# Patient Record
Sex: Male | Born: 1950
Health system: Southern US, Community
[De-identification: ages and names within clinical notes are randomized; demographics above are authoritative.]

## PROBLEM LIST (undated history)

## (undated) DIAGNOSIS — I2510 Atherosclerotic heart disease of native coronary artery without angina pectoris: Secondary | ICD-10-CM

## (undated) DIAGNOSIS — I509 Heart failure, unspecified: Secondary | ICD-10-CM

## (undated) DIAGNOSIS — I10 Essential (primary) hypertension: Secondary | ICD-10-CM

## (undated) DIAGNOSIS — E785 Hyperlipidemia, unspecified: Secondary | ICD-10-CM

## (undated) DIAGNOSIS — G4733 Obstructive sleep apnea (adult) (pediatric): Secondary | ICD-10-CM

## (undated) HISTORY — DX: Hyperlipidemia, unspecified: E78.5

## (undated) HISTORY — DX: Obstructive sleep apnea (adult) (pediatric): G47.33

## (undated) HISTORY — PX: CARDIAC CATHETERIZATION: SHX172

## (undated) HISTORY — DX: Essential (primary) hypertension: I10

## (undated) HISTORY — DX: Atherosclerotic heart disease of native coronary artery without angina pectoris: I25.10

---

## 1997-10-03 ENCOUNTER — Ambulatory Visit (HOSPITAL_COMMUNITY): Admission: RE | Admit: 1997-10-03 | Discharge: 1997-10-03 | Payer: Self-pay | Admitting: Cardiology

## 1997-10-04 ENCOUNTER — Ambulatory Visit (HOSPITAL_COMMUNITY): Admission: RE | Admit: 1997-10-04 | Discharge: 1997-10-04 | Payer: Self-pay | Admitting: Cardiology

## 2000-06-04 ENCOUNTER — Encounter: Admission: RE | Admit: 2000-06-04 | Discharge: 2000-06-04 | Payer: Self-pay | Admitting: Internal Medicine

## 2002-05-08 ENCOUNTER — Encounter: Payer: Self-pay | Admitting: General Surgery

## 2002-05-10 ENCOUNTER — Encounter (INDEPENDENT_AMBULATORY_CARE_PROVIDER_SITE_OTHER): Payer: Self-pay | Admitting: Specialist

## 2002-05-10 ENCOUNTER — Encounter: Payer: Self-pay | Admitting: General Surgery

## 2002-05-10 ENCOUNTER — Ambulatory Visit (HOSPITAL_COMMUNITY): Admission: RE | Admit: 2002-05-10 | Discharge: 2002-05-10 | Payer: Self-pay | Admitting: General Surgery

## 2002-07-02 ENCOUNTER — Encounter: Payer: Self-pay | Admitting: General Surgery

## 2002-07-02 ENCOUNTER — Ambulatory Visit (HOSPITAL_COMMUNITY): Admission: RE | Admit: 2002-07-02 | Discharge: 2002-07-02 | Payer: Self-pay | Admitting: General Surgery

## 2004-10-06 ENCOUNTER — Ambulatory Visit: Payer: Self-pay | Admitting: Family Medicine

## 2004-10-16 ENCOUNTER — Ambulatory Visit (HOSPITAL_COMMUNITY): Admission: RE | Admit: 2004-10-16 | Discharge: 2004-10-16 | Payer: Self-pay | Admitting: Family Medicine

## 2004-10-20 ENCOUNTER — Ambulatory Visit: Payer: Self-pay | Admitting: Gastroenterology

## 2004-11-06 ENCOUNTER — Ambulatory Visit: Payer: Self-pay | Admitting: Family Medicine

## 2004-11-27 ENCOUNTER — Ambulatory Visit: Payer: Self-pay | Admitting: Gastroenterology

## 2005-05-05 ENCOUNTER — Ambulatory Visit: Payer: Self-pay | Admitting: Family Medicine

## 2005-05-10 ENCOUNTER — Ambulatory Visit (HOSPITAL_COMMUNITY): Admission: RE | Admit: 2005-05-10 | Discharge: 2005-05-10 | Payer: Self-pay | Admitting: Family Medicine

## 2005-06-02 ENCOUNTER — Ambulatory Visit: Payer: Self-pay | Admitting: Family Medicine

## 2005-06-17 ENCOUNTER — Ambulatory Visit: Payer: Self-pay | Admitting: Family Medicine

## 2005-07-16 ENCOUNTER — Ambulatory Visit: Payer: Self-pay | Admitting: Family Medicine

## 2005-07-27 ENCOUNTER — Ambulatory Visit: Payer: Self-pay | Admitting: Family Medicine

## 2005-08-13 ENCOUNTER — Ambulatory Visit: Payer: Self-pay | Admitting: Family Medicine

## 2005-09-06 ENCOUNTER — Ambulatory Visit: Payer: Self-pay | Admitting: Family Medicine

## 2005-09-27 ENCOUNTER — Ambulatory Visit: Payer: Self-pay | Admitting: Family Medicine

## 2005-09-29 ENCOUNTER — Encounter (INDEPENDENT_AMBULATORY_CARE_PROVIDER_SITE_OTHER): Payer: Self-pay | Admitting: Family Medicine

## 2005-09-29 ENCOUNTER — Ambulatory Visit (HOSPITAL_COMMUNITY): Admission: RE | Admit: 2005-09-29 | Discharge: 2005-09-29 | Payer: Self-pay | Admitting: Family Medicine

## 2005-09-29 LAB — CONVERTED CEMR LAB
RBC count: 4.85 10*6/uL
WBC, blood: 7.2 10*3/uL

## 2005-10-25 ENCOUNTER — Ambulatory Visit: Payer: Self-pay | Admitting: Family Medicine

## 2005-12-27 ENCOUNTER — Ambulatory Visit: Payer: Self-pay | Admitting: Family Medicine

## 2006-01-21 ENCOUNTER — Ambulatory Visit (HOSPITAL_COMMUNITY): Admission: RE | Admit: 2006-01-21 | Discharge: 2006-01-21 | Payer: Self-pay | Admitting: Family Medicine

## 2006-01-21 ENCOUNTER — Ambulatory Visit: Payer: Self-pay | Admitting: Family Medicine

## 2006-02-04 ENCOUNTER — Ambulatory Visit: Payer: Self-pay | Admitting: Family Medicine

## 2006-02-23 ENCOUNTER — Ambulatory Visit: Payer: Self-pay | Admitting: Family Medicine

## 2006-07-26 ENCOUNTER — Telehealth (INDEPENDENT_AMBULATORY_CARE_PROVIDER_SITE_OTHER): Payer: Self-pay | Admitting: Family Medicine

## 2006-07-26 DIAGNOSIS — E785 Hyperlipidemia, unspecified: Secondary | ICD-10-CM

## 2006-07-26 DIAGNOSIS — I10 Essential (primary) hypertension: Secondary | ICD-10-CM | POA: Insufficient documentation

## 2006-08-29 ENCOUNTER — Encounter: Payer: Self-pay | Admitting: Family Medicine

## 2006-08-29 DIAGNOSIS — N200 Calculus of kidney: Secondary | ICD-10-CM | POA: Insufficient documentation

## 2006-08-29 DIAGNOSIS — D126 Benign neoplasm of colon, unspecified: Secondary | ICD-10-CM | POA: Insufficient documentation

## 2006-08-30 ENCOUNTER — Ambulatory Visit: Payer: Self-pay | Admitting: Family Medicine

## 2006-08-30 DIAGNOSIS — M722 Plantar fascial fibromatosis: Secondary | ICD-10-CM | POA: Insufficient documentation

## 2006-08-30 LAB — CONVERTED CEMR LAB
Cholesterol, target level: 200 mg/dL
LDL Goal: 130 mg/dL

## 2006-10-07 ENCOUNTER — Encounter (INDEPENDENT_AMBULATORY_CARE_PROVIDER_SITE_OTHER): Payer: Self-pay | Admitting: Family Medicine

## 2006-10-13 LAB — CONVERTED CEMR LAB
AST: 25 units/L (ref 0–37)
Albumin: 4.5 g/dL (ref 3.5–5.2)
Calcium: 9.6 mg/dL (ref 8.4–10.5)
Glucose, Bld: 93 mg/dL (ref 70–99)
Hemoglobin: 15.6 g/dL (ref 13.0–17.0)
Lymphocytes Relative: 31 % (ref 12–46)
Neutro Abs: 3 10*3/uL (ref 1.7–7.7)
Neutrophils Relative %: 54 % (ref 43–77)
PSA: 0.39 ng/mL (ref 0.10–4.00)
Platelets: 241 10*3/uL (ref 150–400)
RBC: 4.92 M/uL (ref 4.22–5.81)
RDW: 13.1 % (ref 11.5–14.0)
Sodium: 140 meq/L (ref 135–145)
Total Bilirubin: 0.7 mg/dL (ref 0.3–1.2)
Total Protein: 7.4 g/dL (ref 6.0–8.3)
VLDL: 23 mg/dL (ref 0–40)

## 2006-11-11 ENCOUNTER — Ambulatory Visit: Payer: Self-pay | Admitting: Family Medicine

## 2006-11-11 DIAGNOSIS — L219 Seborrheic dermatitis, unspecified: Secondary | ICD-10-CM | POA: Insufficient documentation

## 2007-02-20 ENCOUNTER — Telehealth (INDEPENDENT_AMBULATORY_CARE_PROVIDER_SITE_OTHER): Payer: Self-pay | Admitting: Family Medicine

## 2007-02-20 ENCOUNTER — Encounter (INDEPENDENT_AMBULATORY_CARE_PROVIDER_SITE_OTHER): Payer: Self-pay | Admitting: Family Medicine

## 2007-04-25 ENCOUNTER — Telehealth (INDEPENDENT_AMBULATORY_CARE_PROVIDER_SITE_OTHER): Payer: Self-pay | Admitting: *Deleted

## 2007-05-08 ENCOUNTER — Ambulatory Visit: Payer: Self-pay | Admitting: Family Medicine

## 2007-05-08 DIAGNOSIS — F438 Other reactions to severe stress: Secondary | ICD-10-CM | POA: Insufficient documentation

## 2007-05-09 ENCOUNTER — Telehealth (INDEPENDENT_AMBULATORY_CARE_PROVIDER_SITE_OTHER): Payer: Self-pay | Admitting: *Deleted

## 2007-05-09 LAB — CONVERTED CEMR LAB
ALT: 43 units/L (ref 0–53)
AST: 30 units/L (ref 0–37)
Chloride: 106 meq/L (ref 96–112)
Potassium: 3.9 meq/L (ref 3.5–5.3)
Sodium: 139 meq/L (ref 135–145)

## 2007-05-10 ENCOUNTER — Encounter (INDEPENDENT_AMBULATORY_CARE_PROVIDER_SITE_OTHER): Payer: Self-pay | Admitting: Family Medicine

## 2007-05-26 ENCOUNTER — Telehealth (INDEPENDENT_AMBULATORY_CARE_PROVIDER_SITE_OTHER): Payer: Self-pay | Admitting: Family Medicine

## 2007-05-26 ENCOUNTER — Ambulatory Visit: Payer: Self-pay | Admitting: Family Medicine

## 2007-06-09 ENCOUNTER — Ambulatory Visit: Payer: Self-pay | Admitting: Family Medicine

## 2007-07-07 ENCOUNTER — Ambulatory Visit: Payer: Self-pay | Admitting: Family Medicine

## 2007-07-13 ENCOUNTER — Encounter (INDEPENDENT_AMBULATORY_CARE_PROVIDER_SITE_OTHER): Payer: Self-pay | Admitting: Family Medicine

## 2007-07-15 ENCOUNTER — Encounter (INDEPENDENT_AMBULATORY_CARE_PROVIDER_SITE_OTHER): Payer: Self-pay | Admitting: Family Medicine

## 2007-08-17 ENCOUNTER — Telehealth (INDEPENDENT_AMBULATORY_CARE_PROVIDER_SITE_OTHER): Payer: Self-pay | Admitting: Family Medicine

## 2007-10-05 ENCOUNTER — Ambulatory Visit: Payer: Self-pay | Admitting: Family Medicine

## 2007-10-05 DIAGNOSIS — E119 Type 2 diabetes mellitus without complications: Secondary | ICD-10-CM

## 2007-10-05 LAB — CONVERTED CEMR LAB
Bilirubin Urine: NEGATIVE
Glucose, Bld: 114 mg/dL
Ketones, urine, test strip: NEGATIVE
Specific Gravity, Urine: 1.02
WBC Urine, dipstick: NEGATIVE

## 2007-10-07 ENCOUNTER — Encounter (INDEPENDENT_AMBULATORY_CARE_PROVIDER_SITE_OTHER): Payer: Self-pay | Admitting: Family Medicine

## 2007-10-09 ENCOUNTER — Telehealth (INDEPENDENT_AMBULATORY_CARE_PROVIDER_SITE_OTHER): Payer: Self-pay | Admitting: *Deleted

## 2007-10-10 LAB — CONVERTED CEMR LAB
ALT: 52 units/L (ref 0–53)
Alkaline Phosphatase: 46 units/L (ref 39–117)
BUN: 14 mg/dL (ref 6–23)
Basophils Absolute: 0 10*3/uL (ref 0.0–0.1)
Calcium: 9.5 mg/dL (ref 8.4–10.5)
Creatinine, Urine: 176.5 mg/dL
HCT: 44 % (ref 39.0–52.0)
HDL: 45 mg/dL (ref >39)
Lymphocytes Relative: 35 % (ref 12–46)
Lymphs Abs: 2.1 10*3/uL (ref 0.7–4.0)
MCHC: 32.7 g/dL (ref 30.0–36.0)
MCV: 95.7 fL (ref 78.0–100.0)
Microalb, Ur: 0.6 mg/dL (ref 0.00–1.89)
Neutro Abs: 3.2 10*3/uL (ref 1.7–7.7)
Neutrophils Relative %: 53 % (ref 43–77)
RDW: 13.3 % (ref 11.5–15.5)
Sodium: 141 meq/L (ref 135–145)
Total Bilirubin: 0.5 mg/dL (ref 0.3–1.2)
Total Protein: 7.1 g/dL (ref 6.0–8.3)
VLDL: 27 mg/dL (ref 0–40)
WBC: 6 10*3/uL (ref 4.0–10.5)

## 2007-10-18 ENCOUNTER — Encounter (INDEPENDENT_AMBULATORY_CARE_PROVIDER_SITE_OTHER): Payer: Self-pay | Admitting: Internal Medicine

## 2007-10-18 ENCOUNTER — Ambulatory Visit: Payer: Self-pay | Admitting: Family Medicine

## 2007-10-20 ENCOUNTER — Encounter (INDEPENDENT_AMBULATORY_CARE_PROVIDER_SITE_OTHER): Payer: Self-pay | Admitting: Family Medicine

## 2007-12-28 ENCOUNTER — Ambulatory Visit: Payer: Self-pay | Admitting: Family Medicine

## 2007-12-28 LAB — CONVERTED CEMR LAB: Glucose, Bld: 83 mg/dL

## 2008-03-29 ENCOUNTER — Ambulatory Visit: Payer: Self-pay | Admitting: Family Medicine

## 2008-04-09 ENCOUNTER — Telehealth (INDEPENDENT_AMBULATORY_CARE_PROVIDER_SITE_OTHER): Payer: Self-pay | Admitting: Family Medicine

## 2008-04-10 ENCOUNTER — Ambulatory Visit: Payer: Self-pay | Admitting: Family Medicine

## 2008-07-03 ENCOUNTER — Encounter (INDEPENDENT_AMBULATORY_CARE_PROVIDER_SITE_OTHER): Payer: Self-pay | Admitting: Family Medicine

## 2008-08-16 ENCOUNTER — Ambulatory Visit: Payer: Self-pay | Admitting: Family Medicine

## 2008-08-16 DIAGNOSIS — M109 Gout, unspecified: Secondary | ICD-10-CM

## 2008-08-17 ENCOUNTER — Encounter (INDEPENDENT_AMBULATORY_CARE_PROVIDER_SITE_OTHER): Payer: Self-pay | Admitting: Family Medicine

## 2008-08-19 LAB — CONVERTED CEMR LAB
ALT: 28 units/L (ref 0–53)
AST: 22 units/L (ref 0–37)
Albumin: 4.8 g/dL (ref 3.5–5.2)
BUN: 19 mg/dL (ref 6–23)
CO2: 24 meq/L (ref 19–32)
Glucose, Bld: 113 mg/dL — ABNORMAL HIGH (ref 70–99)
Potassium: 4.4 meq/L (ref 3.5–5.3)
Uric Acid, Serum: 10.2 mg/dL — ABNORMAL HIGH (ref 4.0–7.8)

## 2008-09-27 ENCOUNTER — Encounter (INDEPENDENT_AMBULATORY_CARE_PROVIDER_SITE_OTHER): Payer: Self-pay | Admitting: Family Medicine

## 2008-10-01 ENCOUNTER — Encounter (INDEPENDENT_AMBULATORY_CARE_PROVIDER_SITE_OTHER): Payer: Self-pay | Admitting: *Deleted

## 2008-10-01 LAB — CONVERTED CEMR LAB: Uric Acid, Serum: 5.8 mg/dL (ref 4.0–7.8)

## 2008-11-08 ENCOUNTER — Telehealth (INDEPENDENT_AMBULATORY_CARE_PROVIDER_SITE_OTHER): Payer: Self-pay | Admitting: *Deleted

## 2008-11-15 ENCOUNTER — Ambulatory Visit: Payer: Self-pay | Admitting: Family Medicine

## 2008-11-15 LAB — CONVERTED CEMR LAB: Blood Glucose, Fasting: 113 mg/dL

## 2008-11-16 ENCOUNTER — Encounter (INDEPENDENT_AMBULATORY_CARE_PROVIDER_SITE_OTHER): Payer: Self-pay | Admitting: Family Medicine

## 2008-11-18 LAB — CONVERTED CEMR LAB
BUN: 19 mg/dL (ref 6–23)
Basophils Relative: 0 % (ref 0–1)
CO2: 23 meq/L (ref 19–32)
Calcium: 9.6 mg/dL (ref 8.4–10.5)
Creatinine, Urine: 112.2 mg/dL
Eosinophils Absolute: 0.1 10*3/uL (ref 0.0–0.7)
HCT: 42.2 % (ref 39.0–52.0)
HDL: 41 mg/dL (ref >39)
Lymphocytes Relative: 25 % (ref 12–46)
Lymphs Abs: 1.7 10*3/uL (ref 0.7–4.0)
MCV: 92.7 fL (ref 78.0–100.0)
Microalb Creat Ratio: 4.5 mg/g (ref 0.0–30.0)
Microalb, Ur: 0.5 mg/dL (ref 0.00–1.89)
Monocytes Absolute: 0.6 10*3/uL (ref 0.1–1.0)
Neutrophils Relative %: 65 % (ref 43–77)
Platelets: 206 10*3/uL (ref 150–400)
Potassium: 4 meq/L (ref 3.5–5.3)
Sodium: 139 meq/L (ref 135–145)
TSH: 1.424 microintl units/mL (ref 0.350–4.500)
Triglycerides: 120 mg/dL (ref <150)
WBC: 6.8 10*3/uL (ref 4.0–10.5)

## 2008-12-19 ENCOUNTER — Encounter (INDEPENDENT_AMBULATORY_CARE_PROVIDER_SITE_OTHER): Payer: Self-pay | Admitting: Family Medicine

## 2009-12-23 ENCOUNTER — Encounter (INDEPENDENT_AMBULATORY_CARE_PROVIDER_SITE_OTHER): Payer: Self-pay | Admitting: *Deleted

## 2009-12-25 ENCOUNTER — Ambulatory Visit: Payer: Self-pay | Admitting: Gastroenterology

## 2010-01-08 ENCOUNTER — Ambulatory Visit: Payer: Self-pay | Admitting: Gastroenterology

## 2010-01-09 ENCOUNTER — Encounter: Payer: Self-pay | Admitting: Gastroenterology

## 2010-06-23 NOTE — Procedures (Signed)
Summary: Colonoscopy  Patient: Warren [name] Note: All result statuses are Final unless otherwise noted.  Tests: (1) Colonoscopy (COL)   COL Colonoscopy           DONE     [facility]     [address]     [place], [place]  [address]           COLONOSCOPY PROCEDURE REPORT           PATIENT:  Warren, [name]  MR#:  [id number]     BIRTHDATE:  1950/06/21, 59 yrs. old  GENDER:  male           ENDOSCOPIST:  Warren Hair. Arlyce Dice, MD     Referred by:           PROCEDURE DATE:  01/08/2010     PROCEDURE:  Colonoscopy with snare polypectomy     ASA CLASS:  Class I     INDICATIONS:  1) screening  2) history of pre-cancerous     (adenomatous) colon polyps Last colo 2006           MEDICATIONS:   Fentanyl 75 mcg IV, Versed 7 mg IV           DESCRIPTION OF PROCEDURE:   After the risks benefits and     alternatives of the procedure were thoroughly explained, informed     consent was obtained.  Digital rectal exam was performed and     revealed no abnormalities.   The [id number] [id number] endoscope was     introduced through the anus and advanced to the cecum, which was     identified by both the appendix and ileocecal valve, without     limitations.  The quality of the prep was excellent, using     MoviPrep.  The instrument was then slowly withdrawn as the colon     was fully examined.     <<PROCEDUREIMAGES>>           FINDINGS:  A sessile polyp was found at the hepatic flexure. It     was 4 mm in size. Polyp was snared without cautery. Retrieval was     successful (see image1 and image2). snare polyp  This was     otherwise a normal examination of the colon (see image3, image4,     image5, image6, image8, image10, image11, and image12).     Retroflexed views in the rectum revealed no abnormalities.    The     time to cecum =  3.50  minutes. The scope was then withdrawn (time     =  6.0  min) from the patient and the procedure completed.           COMPLICATIONS:  None           ENDOSCOPIC  IMPRESSION:     1) 4 mm sessile polyp at the hepatic flexure     2) Otherwise normal examination     RECOMMENDATIONS:     1) If the polyp(s) removed today are proven to be adenomatous     (pre-cancerous) polyps, you will need a repeat colonoscopy in 5     years. Otherwise you should continue to follow colorectal cancer     screening guidelines for "routine risk" patients with colonoscopy     in 10 years.           REPEAT EXAM:   You will receive a letter from Dr. Arlyce Dice in 1-2     weeks,  after reviewing the final pathology, with followup     recommendations.           ______________________________     Warren Hair Arlyce Dice, MD           CC: Dwana Melena MD           n.     Warren DoctorBarbette Hair. Aidynn [name] at 01/08/2010 09:56 AM           Warren [name], [id number]  Note: An exclamation mark (!) indicates a result that was not dispersed into the flowsheet. Document Creation Date: 01/08/2010 9:56 AM _______________________________________________________________________  (1) Order result status: Final Collection or observation date-time: 01/08/2010 09:52 Requested date-time:  Receipt date-time:  Reported date-time:  Referring Physician:   Ordering Physician: Melvia Heaps [phone]) Specimen Source:  Source: Launa Grill Order Number: [id number] Lab site:   Appended Document: Colonoscopy     Procedures Next Due Date:    Colonoscopy: 12/2014

## 2010-06-23 NOTE — Miscellaneous (Signed)
Summary: [facility] Previsit/prep  Clinical Lists Changes  Medications: Added new medication of MOVIPREP 100 GM  SOLR (PEG-KCL-NACL-NASULF-NA ASC-C) As per prep instructions. - Signed Rx of MOVIPREP 100 GM  SOLR (PEG-KCL-NACL-NASULF-NA ASC-C) As per prep instructions.;  #1 x 0;  Signed;  Entered by: Wyona Almas RN;  Authorized by: Louis Meckel MD;  Method used: Electronically to [facility] [address] [place], [place], [place]  [address], Ph: [phone], Fax: [phone] Observations: Added new observation of ALLERGY REV: Done (12/25/2009 7:38)    Prescriptions: MOVIPREP 100 GM  SOLR (PEG-KCL-NACL-NASULF-NA ASC-C) As per prep instructions.  #1 x 0   Entered by:   Wyona Almas RN   Authorized by:   Louis Meckel MD   Signed by:   Wyona Almas RN on 12/25/2009   Method used:   Electronically to        [facility]  [address] (retail)       [address]       [place], [place]  [address]       Ph: [phone]       Fax: [phone]   RxID:   [id number]

## 2010-06-23 NOTE — Letter (Signed)
Summary: Moviprep Instructions  [facility]  [address]   [place], [place] [address]   Phone: [phone]  Fax: [phone]       REECE FEHNEL    1951/04/18    MRN: [id number]        Procedure Day Dorna Bloom: Thursday, 01-08-10     Arrival Time: 8:30 a.m.     Procedure Time: 9:30 a.m.     Location of Procedure:                    x   [facility] (4th Floor)                        PREPARATION FOR COLONOSCOPY WITH MOVIPREP   Starting 5 days prior to your procedure 01-03-10 do not eat nuts, seeds, popcorn, corn, beans, peas,  salads, or any raw vegetables.  Do not take any fiber supplements (e.g. Metamucil, Citrucel, and Benefiber).  THE DAY BEFORE YOUR PROCEDURE         DATE:  01-07-10  DAY: Wednesday  1.  Drink clear liquids the entire day-NO SOLID FOOD  2.  Do not drink anything colored red or purple.  Avoid juices with pulp.  No orange juice.  3.  Drink at least 64 oz. (8 glasses) of fluid/clear liquids during the day to prevent dehydration and help the prep work efficiently.  CLEAR LIQUIDS INCLUDE: Water Jello Ice Popsicles Tea (sugar ok, no milk/cream) Powdered fruit flavored drinks Coffee (sugar ok, no milk/cream) Gatorade Juice: apple, white grape, white cranberry  Lemonade Clear bullion, consomm, broth Carbonated beverages (any kind) Strained chicken noodle soup Hard Candy                             4.  In the morning, mix first dose of MoviPrep solution:    Empty 1 Pouch A and 1 Pouch B into the disposable container    Add lukewarm drinking water to the top line of the container. Mix to dissolve    Refrigerate (mixed solution should be used within 24 hrs)  5.  Begin drinking the prep at 5:00 p.m. The MoviPrep container is divided by 4 marks.   Every 15 minutes drink the solution down to the next mark (approximately 8 oz) until the full liter is complete.   6.  Follow completed prep with 16 oz of clear liquid of your choice  (Nothing red or purple).  Continue to drink clear liquids until bedtime.  7.  Before going to bed, mix second dose of MoviPrep solution:    Empty 1 Pouch A and 1 Pouch B into the disposable container    Add lukewarm drinking water to the top line of the container. Mix to dissolve    Refrigerate  THE DAY OF YOUR PROCEDURE      DATE: 01-08-10 DAY: Thursday  Beginning at 4:30 a.m. (5 hours before procedure):         1. Every 15 minutes, drink the solution down to the next mark (approx 8 oz) until the full liter is complete.  2. Follow completed prep with 16 oz. of clear liquid of your choice.    3. You may drink clear liquids until  7:30 a.m.  (2 HOURS BEFORE PROCEDURE).   MEDICATION INSTRUCTIONS  Unless otherwise instructed, you should take regular prescription medications with a small sip of water   as early as possible  the morning of your procedure.   Additional medication instructions:   Hold HCTZ  the morning of procedure.         OTHER INSTRUCTIONS  You will need a responsible adult at least 60 years of age to accompany you and drive you home.   This person must remain in the waiting room during your procedure.  Wear loose fitting clothing that is easily removed.  Leave jewelry and other valuables at home.  However, you may wish to bring a book to read or  an iPod/MP3 player to listen to music as you wait for your procedure to start.  Remove all body piercing jewelry and leave at home.  Total time from sign-in until discharge is approximately 2-3 hours.  You should go home directly after your procedure and rest.  You can resume normal activities the  day after your procedure.  The day of your procedure you should not:   Drive   Make legal decisions   Operate machinery   Drink alcohol   Return to work  You will receive specific instructions about eating, activities and medications before you leave.    The above instructions have been reviewed and  explained to me by  Wyona Almas RN  August 59, 2011 8:23 AM     I fully understand and can verbalize these instructions _____________________________ Date _________

## 2010-06-23 NOTE — Letter (Signed)
Summary: Patient Notice- Polyp Results  [facility]  [address] [place], [place] [address]   Phone: [phone]  Fax: [phone]        January 09, 2010 MRN: [id number]    Warren [name] [address] [place], [place]  [address]    Dear [name],  I am pleased to inform you that the colon polyp(s) removed during your recent colonoscopy was (were) found to be benign (no cancer detected) upon pathologic examination.  I recommend you have a repeat colonoscopy examination in 5_ years to look for recurrent polyps, as having colon polyps increases your risk for having recurrent polyps or even colon cancer in the future.  Should you develop new or worsening symptoms of abdominal pain, bowel habit changes or bleeding from the rectum or bowels, please schedule an evaluation with either your primary care physician or with me.  Additional information/recommendations:  __ No further action with gastroenterology is needed at this time. Please      follow-up with your primary care physician for your other healthcare      needs.  __ Please call [phone] to schedule a return visit to review your      situation.  __ Please keep your follow-up visit as already scheduled.  _x_ Continue treatment plan as outlined the day of your exam.  Please call us if you are having persistent problems or have questions about your condition that have not been fully answered at this time.  Sincerely,  Louis Meckel MD  This letter has been electronically signed by your physician.  Appended Document: Patient Notice- Polyp Results letter mailed

## 2010-10-09 NOTE — Op Note (Signed)
NAME:  Warren [name], Warren [name]                           ACCOUNT NO.:  [id number]   MEDICAL RECORD NO.:  [id number]                   PATIENT TYPE:  OIB   LOCATION:  2864                                 FACILITY:  [facility]   PHYSICIAN:  Angelia Mould. Derrell Lolling, M.D.             DATE OF BIRTH:  [birth year]   DATE OF PROCEDURE:  05/10/2002  DATE OF DISCHARGE:                                 OPERATIVE REPORT   PREOPERATIVE DIAGNOSES:  1. Chronic cholecystitis with cholelithiasis.  2. Nonalcoholic hepatic steatosis.   POSTOPERATIVE DIAGNOSES:  1. Chronic cholecystitis with cholelithiasis.  2. Nonalcoholic hepatic steatosis.   PROCEDURES:  1. A 14-gauge core needle biopsy of liver.  2. Laparoscopic cholecystectomy with intraoperative cholangiogram.   SURGEON:  Angelia Mould. Derrell Lolling, M.D.   ASSISTANT:  Currie Paris, M.D.   ESTIMATED BLOOD LOSS:  About 20 cc.   COMPLICATIONS:  None.   OPERATIVE INDICATION:  This is a 60 year old white man who has a long  history of nonalcoholic hepatic steatosis.  For the past three to four  months he has had intermittent episodes of postprandial right upper quadrant  pain.  Liver function tests show mild elevation of SGPT, otherwise they are  normal.  Abdominal ultrasound shows multiple shadowing gallstones.  The  common bile duct measures 6.9 mm.  He is brought to the operating room  electively for liver biopsy and cholecystectomy.   OPERATIVE FINDINGS:  The gallbladder was chronically inflamed and somewhat  thick-walled.  The anatomy of the cystic duct, cystic artery, and common  bile duct were conventional.  The cholangiogram was normal showing normal  intrahepatic and extrahepatic bile ducts, no filling defects, and prompt  flow of contrast into the duodenum.  The liver had a normal visual  appearance.  The large intestine, small intestine, stomach, duodenum, and  peritoneal surfaces were normal.   DESCRIPTION OF PROCEDURE:  Following the induction of  general endotracheal  anesthesia, the patient's abdomen was prepped and draped in a sterile  fashion.  Marcaine 0.5% with epinephrine was used as a local infiltration  anesthetic.  A vertically-oriented incision was made in the lower rim at the  umbilicus.  The fascia was incised in the midline and the abdominal cavity  entered under direct vision.  A 10 mm Hasson trocar was inserted with a  pursestring suture of 0 Vicryl.  A pneumoperitoneum was created.  The video  camera was inserted with visualization and findings as described above.  A  10 mm trocar was placed in the subxiphoid region and two 5 mm trocars placed  in the right midabdomen.   We passed a  TruCut liver biopsy needle through the right subcostal space  and took a single large core of the right lobe of the liver.  This was  inspected and found to be a very nice, complete core, and we felt that that  would be all  that would be necessary.  We cauterized the liver biopsy site,  and we had excellent hemostasis.   The gallbladder was elevated.  Some adhesions were taken down.  We dissected  out the cystic duct and the cystic artery.  The cystic artery was isolated  as it went onto the gallbladder wall, secured with metal clips, and divided.  We created a nice, large window behind the cystic duct.  A clip was placed  on the cystic duct near the gallbladder just below the impacted stone.  We  opened the cystic duct and inserted a cholangiogram catheter.  The  cholangiogram was obtained using the C-arm.  This showed normal intrahepatic  and extrahepatic bile ducts, good flow of contrast into the duodenum, and no  filling defects.  The cholangiogram catheter was removed.  The cystic duct  was secured with multiple metal clips and divided.  The gallbladder was  dissected from its bed and removed through the umbilical port.  The  operative field was irrigated.  At the completion of the case, all the  irrigation fluid was clear and  there was no bleeding or bile leak anywhere.  The trocars were removed under direct vision, and there was no bleeding from  the trocar sites.  The pneumoperitoneum was released.  The fascia at the  umbilicus was closed with 0 Vicryl suture.  Skin incisions were irrigated  with saline and the skin closed with subcuticular sutures of 4-0 Vicryl and  Steri-Strips.  Clean bandages were placed and the patient taken to the  recovery room in stable condition.  Sponge, needle, and instrument counts  were correct.                                               Angelia Mould. Derrell Lolling, M.D.    HMI/MEDQ  D:  05/10/2002  T:  05/10/2002  Job:  [id number]   cc:   Barbette Hair. Arlyce Dice, M.D. [facility]  [address]  [place]  [place] [address]  Fax: [phone]   Madelin Rear. Sherwood Gambler, M.D.  [address]  [place]  [place] [address]  Fax: [phone]   Madaline Savage, M.D.  [id number] [address]  [place]  [place] [address]  Fax: [phone]

## 2014-01-03 ENCOUNTER — Other Ambulatory Visit (HOSPITAL_COMMUNITY): Payer: Self-pay | Admitting: Internal Medicine

## 2014-01-03 DIAGNOSIS — Z87891 Personal history of nicotine dependence: Secondary | ICD-10-CM

## 2014-01-08 ENCOUNTER — Ambulatory Visit (HOSPITAL_COMMUNITY)
Admission: RE | Admit: 2014-01-08 | Discharge: 2014-01-08 | Disposition: A | Discharge status: Home / Self Care | Payer: BC Managed Care – PPO | Source: Ambulatory Visit | Attending: Internal Medicine | Admitting: Internal Medicine

## 2014-01-08 DIAGNOSIS — Z122 Encounter for screening for malignant neoplasm of respiratory organs: Secondary | ICD-10-CM | POA: Diagnosis not present

## 2014-01-08 DIAGNOSIS — Z87891 Personal history of nicotine dependence: Secondary | ICD-10-CM | POA: Diagnosis not present

## 2014-01-08 DIAGNOSIS — R918 Other nonspecific abnormal finding of lung field: Secondary | ICD-10-CM | POA: Insufficient documentation

## 2014-01-08 DIAGNOSIS — I2510 Atherosclerotic heart disease of native coronary artery without angina pectoris: Secondary | ICD-10-CM | POA: Diagnosis not present

## 2014-07-30 ENCOUNTER — Other Ambulatory Visit (HOSPITAL_COMMUNITY): Payer: Self-pay | Admitting: Internal Medicine

## 2014-07-30 DIAGNOSIS — F17200 Nicotine dependence, unspecified, uncomplicated: Secondary | ICD-10-CM

## 2014-08-02 ENCOUNTER — Ambulatory Visit (HOSPITAL_COMMUNITY)
Admission: RE | Admit: 2014-08-02 | Discharge: 2014-08-02 | Disposition: A | Discharge status: Home / Self Care | Payer: BLUE CROSS/BLUE SHIELD | Source: Ambulatory Visit | Attending: Internal Medicine | Admitting: Internal Medicine

## 2014-08-02 DIAGNOSIS — Z87891 Personal history of nicotine dependence: Secondary | ICD-10-CM | POA: Diagnosis not present

## 2014-08-02 DIAGNOSIS — Z122 Encounter for screening for malignant neoplasm of respiratory organs: Secondary | ICD-10-CM | POA: Diagnosis not present

## 2014-08-02 DIAGNOSIS — F17200 Nicotine dependence, unspecified, uncomplicated: Secondary | ICD-10-CM

## 2014-12-13 ENCOUNTER — Encounter: Payer: Self-pay | Admitting: Gastroenterology

## 2014-12-23 ENCOUNTER — Encounter: Payer: Self-pay | Admitting: Gastroenterology

## 2015-03-11 ENCOUNTER — Encounter: Payer: Self-pay | Admitting: Gastroenterology

## 2015-03-14 ENCOUNTER — Encounter: Payer: BLUE CROSS/BLUE SHIELD | Admitting: Gastroenterology

## 2015-07-18 ENCOUNTER — Other Ambulatory Visit: Payer: Self-pay | Admitting: Gastroenterology

## 2016-11-08 DIAGNOSIS — Z125 Encounter for screening for malignant neoplasm of prostate: Secondary | ICD-10-CM | POA: Diagnosis not present

## 2016-11-08 DIAGNOSIS — I10 Essential (primary) hypertension: Secondary | ICD-10-CM | POA: Diagnosis not present

## 2016-11-08 DIAGNOSIS — R7301 Impaired fasting glucose: Secondary | ICD-10-CM | POA: Diagnosis not present

## 2016-11-10 DIAGNOSIS — J0100 Acute maxillary sinusitis, unspecified: Secondary | ICD-10-CM | POA: Diagnosis not present

## 2016-11-10 DIAGNOSIS — Z6829 Body mass index (BMI) 29.0-29.9, adult: Secondary | ICD-10-CM | POA: Diagnosis not present

## 2016-11-10 DIAGNOSIS — C44319 Basal cell carcinoma of skin of other parts of face: Secondary | ICD-10-CM | POA: Diagnosis not present

## 2016-11-10 DIAGNOSIS — R7301 Impaired fasting glucose: Secondary | ICD-10-CM | POA: Diagnosis not present

## 2016-11-10 DIAGNOSIS — E782 Mixed hyperlipidemia: Secondary | ICD-10-CM | POA: Diagnosis not present

## 2016-11-10 DIAGNOSIS — F5101 Primary insomnia: Secondary | ICD-10-CM | POA: Diagnosis not present

## 2016-11-10 DIAGNOSIS — R945 Abnormal results of liver function studies: Secondary | ICD-10-CM | POA: Diagnosis not present

## 2016-11-10 DIAGNOSIS — I10 Essential (primary) hypertension: Secondary | ICD-10-CM | POA: Diagnosis not present

## 2017-03-22 DIAGNOSIS — I10 Essential (primary) hypertension: Secondary | ICD-10-CM | POA: Diagnosis not present

## 2017-03-22 DIAGNOSIS — R7301 Impaired fasting glucose: Secondary | ICD-10-CM | POA: Diagnosis not present

## 2017-03-22 DIAGNOSIS — Z125 Encounter for screening for malignant neoplasm of prostate: Secondary | ICD-10-CM | POA: Diagnosis not present

## 2017-03-22 DIAGNOSIS — E782 Mixed hyperlipidemia: Secondary | ICD-10-CM | POA: Diagnosis not present

## 2017-03-29 DIAGNOSIS — R945 Abnormal results of liver function studies: Secondary | ICD-10-CM | POA: Diagnosis not present

## 2017-03-29 DIAGNOSIS — E785 Hyperlipidemia, unspecified: Secondary | ICD-10-CM | POA: Diagnosis not present

## 2017-03-29 DIAGNOSIS — C44310 Basal cell carcinoma of skin of unspecified parts of face: Secondary | ICD-10-CM | POA: Diagnosis not present

## 2017-03-29 DIAGNOSIS — G4700 Insomnia, unspecified: Secondary | ICD-10-CM | POA: Diagnosis not present

## 2017-03-29 DIAGNOSIS — Z125 Encounter for screening for malignant neoplasm of prostate: Secondary | ICD-10-CM | POA: Diagnosis not present

## 2017-03-29 DIAGNOSIS — Z6828 Body mass index (BMI) 28.0-28.9, adult: Secondary | ICD-10-CM | POA: Diagnosis not present

## 2017-03-29 DIAGNOSIS — Z0000 Encounter for general adult medical examination without abnormal findings: Secondary | ICD-10-CM | POA: Diagnosis not present

## 2017-03-29 DIAGNOSIS — I10 Essential (primary) hypertension: Secondary | ICD-10-CM | POA: Diagnosis not present

## 2017-03-29 DIAGNOSIS — J309 Allergic rhinitis, unspecified: Secondary | ICD-10-CM | POA: Diagnosis not present

## 2017-03-29 DIAGNOSIS — R7301 Impaired fasting glucose: Secondary | ICD-10-CM | POA: Diagnosis not present

## 2017-03-29 DIAGNOSIS — Z8739 Personal history of other diseases of the musculoskeletal system and connective tissue: Secondary | ICD-10-CM | POA: Diagnosis not present

## 2017-04-08 DIAGNOSIS — Z23 Encounter for immunization: Secondary | ICD-10-CM | POA: Diagnosis not present

## 2017-04-12 DIAGNOSIS — X32XXXD Exposure to sunlight, subsequent encounter: Secondary | ICD-10-CM | POA: Diagnosis not present

## 2017-04-12 DIAGNOSIS — L570 Actinic keratosis: Secondary | ICD-10-CM | POA: Diagnosis not present

## 2017-04-12 DIAGNOSIS — D225 Melanocytic nevi of trunk: Secondary | ICD-10-CM | POA: Diagnosis not present

## 2017-08-25 DIAGNOSIS — H2513 Age-related nuclear cataract, bilateral: Secondary | ICD-10-CM | POA: Diagnosis not present

## 2017-08-25 DIAGNOSIS — H04123 Dry eye syndrome of bilateral lacrimal glands: Secondary | ICD-10-CM | POA: Diagnosis not present

## 2017-08-25 DIAGNOSIS — H5203 Hypermetropia, bilateral: Secondary | ICD-10-CM | POA: Diagnosis not present

## 2017-09-28 DIAGNOSIS — E785 Hyperlipidemia, unspecified: Secondary | ICD-10-CM | POA: Diagnosis not present

## 2017-09-28 DIAGNOSIS — R7301 Impaired fasting glucose: Secondary | ICD-10-CM | POA: Diagnosis not present

## 2017-10-03 DIAGNOSIS — E6609 Other obesity due to excess calories: Secondary | ICD-10-CM | POA: Diagnosis not present

## 2017-10-03 DIAGNOSIS — E782 Mixed hyperlipidemia: Secondary | ICD-10-CM | POA: Diagnosis not present

## 2017-10-03 DIAGNOSIS — R7301 Impaired fasting glucose: Secondary | ICD-10-CM | POA: Diagnosis not present

## 2017-10-03 DIAGNOSIS — J309 Allergic rhinitis, unspecified: Secondary | ICD-10-CM | POA: Diagnosis not present

## 2017-10-03 DIAGNOSIS — Z6827 Body mass index (BMI) 27.0-27.9, adult: Secondary | ICD-10-CM | POA: Diagnosis not present

## 2017-10-03 DIAGNOSIS — I10 Essential (primary) hypertension: Secondary | ICD-10-CM | POA: Diagnosis not present

## 2017-10-03 DIAGNOSIS — G4700 Insomnia, unspecified: Secondary | ICD-10-CM | POA: Diagnosis not present

## 2018-01-04 DIAGNOSIS — E782 Mixed hyperlipidemia: Secondary | ICD-10-CM | POA: Diagnosis not present

## 2018-01-04 DIAGNOSIS — J309 Allergic rhinitis, unspecified: Secondary | ICD-10-CM | POA: Diagnosis not present

## 2018-01-04 DIAGNOSIS — R05 Cough: Secondary | ICD-10-CM | POA: Diagnosis not present

## 2018-01-12 DIAGNOSIS — J309 Allergic rhinitis, unspecified: Secondary | ICD-10-CM | POA: Diagnosis not present

## 2018-01-12 DIAGNOSIS — R05 Cough: Secondary | ICD-10-CM | POA: Diagnosis not present

## 2018-01-12 DIAGNOSIS — Z6827 Body mass index (BMI) 27.0-27.9, adult: Secondary | ICD-10-CM | POA: Diagnosis not present

## 2018-01-12 DIAGNOSIS — J019 Acute sinusitis, unspecified: Secondary | ICD-10-CM | POA: Diagnosis not present

## 2018-03-01 DIAGNOSIS — L821 Other seborrheic keratosis: Secondary | ICD-10-CM | POA: Diagnosis not present

## 2018-03-01 DIAGNOSIS — X32XXXD Exposure to sunlight, subsequent encounter: Secondary | ICD-10-CM | POA: Diagnosis not present

## 2018-03-01 DIAGNOSIS — L570 Actinic keratosis: Secondary | ICD-10-CM | POA: Diagnosis not present

## 2018-03-01 DIAGNOSIS — Z1283 Encounter for screening for malignant neoplasm of skin: Secondary | ICD-10-CM | POA: Diagnosis not present

## 2018-04-03 DIAGNOSIS — Z6828 Body mass index (BMI) 28.0-28.9, adult: Secondary | ICD-10-CM | POA: Diagnosis not present

## 2018-04-03 DIAGNOSIS — Z23 Encounter for immunization: Secondary | ICD-10-CM | POA: Diagnosis not present

## 2018-04-03 DIAGNOSIS — R945 Abnormal results of liver function studies: Secondary | ICD-10-CM | POA: Diagnosis not present

## 2018-04-03 DIAGNOSIS — Z125 Encounter for screening for malignant neoplasm of prostate: Secondary | ICD-10-CM | POA: Diagnosis not present

## 2018-04-03 DIAGNOSIS — I10 Essential (primary) hypertension: Secondary | ICD-10-CM | POA: Diagnosis not present

## 2018-04-03 DIAGNOSIS — C44310 Basal cell carcinoma of skin of unspecified parts of face: Secondary | ICD-10-CM | POA: Diagnosis not present

## 2018-04-03 DIAGNOSIS — R7301 Impaired fasting glucose: Secondary | ICD-10-CM | POA: Diagnosis not present

## 2018-04-03 DIAGNOSIS — J309 Allergic rhinitis, unspecified: Secondary | ICD-10-CM | POA: Diagnosis not present

## 2018-04-03 DIAGNOSIS — R05 Cough: Secondary | ICD-10-CM | POA: Diagnosis not present

## 2018-04-03 DIAGNOSIS — G4700 Insomnia, unspecified: Secondary | ICD-10-CM | POA: Diagnosis not present

## 2018-04-03 DIAGNOSIS — E785 Hyperlipidemia, unspecified: Secondary | ICD-10-CM | POA: Diagnosis not present

## 2018-04-03 DIAGNOSIS — Z8739 Personal history of other diseases of the musculoskeletal system and connective tissue: Secondary | ICD-10-CM | POA: Diagnosis not present

## 2018-04-07 DIAGNOSIS — Z8739 Personal history of other diseases of the musculoskeletal system and connective tissue: Secondary | ICD-10-CM | POA: Diagnosis not present

## 2018-04-07 DIAGNOSIS — R7301 Impaired fasting glucose: Secondary | ICD-10-CM | POA: Diagnosis not present

## 2018-04-07 DIAGNOSIS — I10 Essential (primary) hypertension: Secondary | ICD-10-CM | POA: Diagnosis not present

## 2018-04-07 DIAGNOSIS — Z0000 Encounter for general adult medical examination without abnormal findings: Secondary | ICD-10-CM | POA: Diagnosis not present

## 2018-04-07 DIAGNOSIS — R945 Abnormal results of liver function studies: Secondary | ICD-10-CM | POA: Diagnosis not present

## 2018-04-07 DIAGNOSIS — M79671 Pain in right foot: Secondary | ICD-10-CM | POA: Diagnosis not present

## 2018-04-07 DIAGNOSIS — E782 Mixed hyperlipidemia: Secondary | ICD-10-CM | POA: Diagnosis not present

## 2018-04-07 DIAGNOSIS — G4700 Insomnia, unspecified: Secondary | ICD-10-CM | POA: Diagnosis not present

## 2018-04-07 DIAGNOSIS — Z23 Encounter for immunization: Secondary | ICD-10-CM | POA: Diagnosis not present

## 2018-04-07 DIAGNOSIS — J309 Allergic rhinitis, unspecified: Secondary | ICD-10-CM | POA: Diagnosis not present

## 2018-04-07 DIAGNOSIS — M109 Gout, unspecified: Secondary | ICD-10-CM | POA: Diagnosis not present

## 2018-04-07 DIAGNOSIS — R911 Solitary pulmonary nodule: Secondary | ICD-10-CM | POA: Diagnosis not present

## 2018-05-01 ENCOUNTER — Other Ambulatory Visit: Payer: Self-pay | Admitting: Podiatry

## 2018-05-01 ENCOUNTER — Encounter: Payer: Self-pay | Admitting: Podiatry

## 2018-05-01 ENCOUNTER — Ambulatory Visit (INDEPENDENT_AMBULATORY_CARE_PROVIDER_SITE_OTHER): Payer: Medicare Other

## 2018-05-01 ENCOUNTER — Ambulatory Visit (INDEPENDENT_AMBULATORY_CARE_PROVIDER_SITE_OTHER): Payer: Medicare Other | Admitting: Podiatry

## 2018-05-01 VITALS — BP 126/69 | HR 76 | Resp 16

## 2018-05-01 DIAGNOSIS — M779 Enthesopathy, unspecified: Secondary | ICD-10-CM

## 2018-05-01 DIAGNOSIS — M722 Plantar fascial fibromatosis: Secondary | ICD-10-CM

## 2018-05-01 MED ORDER — MELOXICAM 15 MG PO TABS: 15.0000 mg | ORAL_TABLET | Freq: Every day | ORAL | 0 refills | Status: DC

## 2018-05-01 NOTE — Patient Instructions (Signed)

## 2018-05-01 NOTE — Progress Notes (Signed)
Subjective:    Patient ID: Warren [name], male    DOB: 06/30/1950, 67 y.o.   MRN: [id number]  HPI 67 year old male presents the office today for concern of discomfort in the instep on his right foot more than his left foot.  He states this is been ongoing for at least 6 months or more.  It started getting worse when he went back to work in January.  He also feels that he is walking slightly sideways.  He does have to wear steel toed shoes intermittently and since he has done this the pain started to come back is also noted some discoloration to his toenails.  He has noted some blood with his toenails but denies any redness or drainage or any swelling.  The pain started to get worse in July or August and since then he has not had any significant treatment.  No recent injury.   Review of Systems  All other systems reviewed and are negative.  History reviewed. No pertinent past medical history.  History reviewed. No pertinent surgical history.   Current Outpatient Medications:  .  ALLOPURINOL PO, Take by mouth., Disp: , Rfl:  .  aspirin EC 325 MG tablet, Take 325 mg by mouth daily., Disp: , Rfl:  .  EZETIMIBE PO, Take by mouth., Disp: , Rfl:  .  INDOMETHACIN PO, Take by mouth., Disp: , Rfl:  .  niacin 500 MG tablet, Take 500 mg by mouth at bedtime., Disp: , Rfl:  .  carvedilol (COREG) 12.5 MG tablet, , Disp: , Rfl:  .  hydrochlorothiazide (HYDRODIURIL) 25 MG tablet, , Disp: , Rfl:  .  meloxicam (MOBIC) 15 MG tablet, Take 1 tablet (15 mg total) by mouth daily., Disp: 30 tablet, Rfl: 0  Allergies  Allergen Reactions  . Amlodipine Besylate     REACTION: Not sure - tachycardia  . Penicillins     REACTION: Had once as child and had resp problems  . Valsartan     REACTION: Hyperkalemia        Objective:   Physical Exam General: AAO x3, NAD  Dermatological: Skin is warm, dry and supple bilateral. Nails x 10 are well manicured; remaining integument appears unremarkable at this time.  There are no open sores, no preulcerative lesions, no rash or signs of infection present.  Vascular: Dorsalis Pedis artery and Posterior Tibial artery pedal pulses are 2/4 bilateral with immedate capillary fill time. Pedal hair growth present. No varicosities and no lower extremity edema present bilateral. There is no pain with calf compression, swelling, warmth, erythema.   Neruologic: Grossly intact via light touch bilateral.  Protective threshold with Semmes Wienstein monofilament intact to all pedal sites bilateral.  Negative Tinel sign.  Musculoskeletal: Mild discomfort on the medial arch of the foot on the [place] plantar fascia.  No pain on the insertion into the calcaneus.  There is no pain with lateral compression of the calcaneus there is no pain of the dorsal midfoot.  No edema, erythema.  Flexor, extensor, Achilles tendon appear to be intact.  Muscular strength 5/5 in all groups tested bilateral.  Gait: Unassisted, Nonantalgic.     Assessment & Plan:  67 year old male with medial arch pain, plantar fasciitis -Treatment options discussed including all alternatives, risks, and complications -Etiology of symptoms were discussed -X-rays were obtained and reviewed with the patient.  No evidence of acute fracture or stress fracture identified today. -Discussed shoegear medications are also discussed orthotics.  Meloxicam to take as needed. -Stretching  exercises  Trula Slade DPM

## 2018-10-02 DIAGNOSIS — R7301 Impaired fasting glucose: Secondary | ICD-10-CM | POA: Diagnosis not present

## 2018-10-02 DIAGNOSIS — E785 Hyperlipidemia, unspecified: Secondary | ICD-10-CM | POA: Diagnosis not present

## 2018-10-02 DIAGNOSIS — E782 Mixed hyperlipidemia: Secondary | ICD-10-CM | POA: Diagnosis not present

## 2018-10-02 DIAGNOSIS — I10 Essential (primary) hypertension: Secondary | ICD-10-CM | POA: Diagnosis not present

## 2018-10-02 DIAGNOSIS — D049 Carcinoma in situ of skin, unspecified: Secondary | ICD-10-CM | POA: Insufficient documentation

## 2018-10-10 DIAGNOSIS — J309 Allergic rhinitis, unspecified: Secondary | ICD-10-CM | POA: Diagnosis not present

## 2018-10-10 DIAGNOSIS — M109 Gout, unspecified: Secondary | ICD-10-CM | POA: Diagnosis not present

## 2018-10-10 DIAGNOSIS — I10 Essential (primary) hypertension: Secondary | ICD-10-CM | POA: Diagnosis not present

## 2018-10-10 DIAGNOSIS — R7301 Impaired fasting glucose: Secondary | ICD-10-CM | POA: Diagnosis not present

## 2018-10-10 DIAGNOSIS — R911 Solitary pulmonary nodule: Secondary | ICD-10-CM | POA: Diagnosis not present

## 2018-10-10 DIAGNOSIS — M79671 Pain in right foot: Secondary | ICD-10-CM | POA: Diagnosis not present

## 2018-10-10 DIAGNOSIS — Z23 Encounter for immunization: Secondary | ICD-10-CM | POA: Diagnosis not present

## 2018-10-10 DIAGNOSIS — E782 Mixed hyperlipidemia: Secondary | ICD-10-CM | POA: Diagnosis not present

## 2018-10-10 DIAGNOSIS — M25551 Pain in right hip: Secondary | ICD-10-CM | POA: Diagnosis not present

## 2018-10-10 DIAGNOSIS — H9193 Unspecified hearing loss, bilateral: Secondary | ICD-10-CM | POA: Diagnosis not present

## 2018-10-10 DIAGNOSIS — R945 Abnormal results of liver function studies: Secondary | ICD-10-CM | POA: Diagnosis not present

## 2018-10-13 DIAGNOSIS — L820 Inflamed seborrheic keratosis: Secondary | ICD-10-CM | POA: Diagnosis not present

## 2018-10-13 DIAGNOSIS — D044 Carcinoma in situ of skin of scalp and neck: Secondary | ICD-10-CM | POA: Diagnosis not present

## 2018-11-10 DIAGNOSIS — M25551 Pain in right hip: Secondary | ICD-10-CM | POA: Insufficient documentation

## 2018-11-10 DIAGNOSIS — M1611 Unilateral primary osteoarthritis, right hip: Secondary | ICD-10-CM | POA: Diagnosis not present

## 2018-11-10 DIAGNOSIS — M5416 Radiculopathy, lumbar region: Secondary | ICD-10-CM | POA: Diagnosis not present

## 2018-11-14 DIAGNOSIS — D122 Benign neoplasm of ascending colon: Secondary | ICD-10-CM | POA: Diagnosis not present

## 2018-11-14 DIAGNOSIS — D123 Benign neoplasm of transverse colon: Secondary | ICD-10-CM | POA: Diagnosis not present

## 2018-11-14 DIAGNOSIS — Z86010 Personal history of colonic polyps: Secondary | ICD-10-CM | POA: Diagnosis not present

## 2018-11-17 DIAGNOSIS — D122 Benign neoplasm of ascending colon: Secondary | ICD-10-CM | POA: Diagnosis not present

## 2018-11-17 DIAGNOSIS — D123 Benign neoplasm of transverse colon: Secondary | ICD-10-CM | POA: Diagnosis not present

## 2018-11-20 ENCOUNTER — Ambulatory Visit (INDEPENDENT_AMBULATORY_CARE_PROVIDER_SITE_OTHER): Payer: Medicare Other | Admitting: Otolaryngology

## 2018-11-20 DIAGNOSIS — H903 Sensorineural hearing loss, bilateral: Secondary | ICD-10-CM | POA: Diagnosis not present

## 2018-11-20 DIAGNOSIS — H838X3 Other specified diseases of inner ear, bilateral: Secondary | ICD-10-CM

## 2018-11-29 DIAGNOSIS — C4441 Basal cell carcinoma of skin of scalp and neck: Secondary | ICD-10-CM | POA: Diagnosis not present

## 2018-11-29 DIAGNOSIS — X32XXXD Exposure to sunlight, subsequent encounter: Secondary | ICD-10-CM | POA: Diagnosis not present

## 2018-11-29 DIAGNOSIS — L570 Actinic keratosis: Secondary | ICD-10-CM | POA: Diagnosis not present

## 2018-12-07 DIAGNOSIS — M1611 Unilateral primary osteoarthritis, right hip: Secondary | ICD-10-CM | POA: Diagnosis not present

## 2018-12-22 ENCOUNTER — Telehealth: Payer: Self-pay

## 2018-12-22 NOTE — Telephone Encounter (Signed)
I called pt to switch his 8/18 appt with Dr Tamala Julian from an OV to a VV. He was upset about this and stated that he needs to be seen In-Office. I told him that Dr Tamala Julian doesn't have any open appts until October and he wasn't happy about that. He told me to cancel his appt and call him when he can be seen in-office and that we better hope nothing happens to him before then.   Can someone please find him a soon In-Office appt with any provider. He is a New Patient so it doesn't have to be Dr Tamala Julian.

## 2018-12-28 DIAGNOSIS — H2513 Age-related nuclear cataract, bilateral: Secondary | ICD-10-CM | POA: Diagnosis not present

## 2018-12-28 DIAGNOSIS — H5203 Hypermetropia, bilateral: Secondary | ICD-10-CM | POA: Diagnosis not present

## 2019-01-09 ENCOUNTER — Ambulatory Visit: Payer: Medicare Other | Admitting: Interventional Cardiology

## 2019-01-11 DIAGNOSIS — M1611 Unilateral primary osteoarthritis, right hip: Secondary | ICD-10-CM | POA: Diagnosis not present

## 2019-01-12 ENCOUNTER — Encounter: Payer: Self-pay | Admitting: Cardiovascular Disease

## 2019-01-12 ENCOUNTER — Ambulatory Visit (INDEPENDENT_AMBULATORY_CARE_PROVIDER_SITE_OTHER): Payer: Medicare Other | Admitting: Cardiovascular Disease

## 2019-01-12 ENCOUNTER — Other Ambulatory Visit: Payer: Self-pay

## 2019-01-12 VITALS — BP 100/60 | HR 74 | Temp 97.1°F | Ht 69.0 in | Wt 198.2 lb

## 2019-01-12 DIAGNOSIS — I10 Essential (primary) hypertension: Secondary | ICD-10-CM | POA: Diagnosis not present

## 2019-01-12 DIAGNOSIS — E7800 Pure hypercholesterolemia, unspecified: Secondary | ICD-10-CM

## 2019-01-12 DIAGNOSIS — I2510 Atherosclerotic heart disease of native coronary artery without angina pectoris: Secondary | ICD-10-CM

## 2019-01-12 DIAGNOSIS — I739 Peripheral vascular disease, unspecified: Secondary | ICD-10-CM

## 2019-01-12 DIAGNOSIS — R0602 Shortness of breath: Secondary | ICD-10-CM | POA: Diagnosis not present

## 2019-01-12 MED ORDER — ASPIRIN EC 81 MG PO TBEC: 81.0000 mg | DELAYED_RELEASE_TABLET | Freq: Every day | ORAL | 3 refills | Status: AC

## 2019-01-12 NOTE — Progress Notes (Signed)
Cardiology Office Note:   Date:  01/12/2019  NAME:  Warren [name]    MRN: [id number] DOB:  27-Oct-1950   PCP:  [name], MD  Cardiologist:  [name], MD   Referring MD: [name], MD   Chief Complaint  Patient presents with  . Coronary Artery Disease   History of Present Illness:   Warren [name] is a 68 y.o. male with a hx of hyperlipidemia, former tobacco abuse, osteoarthritis, likely nonobstructive coronary disease who is being seen today for the evaluation of shortness of breath at the request of [name], MD.  He presents with 6 to 8 months of worsening shortness of breath with exertion.  Associated symptoms include profound fatigue.  He reports prior to October of last year he is able to walk 1 to 3 miles without limitation.  But he is noted that he has become more fatigued and short of breath with much less exercise which is not normal for him.  He reports no chest pressure or chest tightness with this, but the symptoms are quite concerning given the very strong family history of coronary disease.  He also has been diagnosed with hip osteoarthritis and follows with orthopedics.  He was informed in the past he had likely PAD, but no intervention was pursued from what I can see.  He was followed by Dr. Melvern Banker a prior cardiologist in [place] who performed a cardiac catheterization in 2004.  He reports he was instructed that no stents or bypass surgery was needed, but he was instructed to quit smoking and has done so since that time.  His mother has a history of coronary disease eventually required bypass surgery, also had extensive carotid artery disease requiring endarterectomy.  He reports she has maintained a normal lipid profile on Zetia, and fish oils.  He has remained on aspirin 325 mg for a number of years.  He does have a history of high blood pressure and takes carvedilol, and hydrochlorothiazide blood pressure well controlled today.  Past Medical History: Past  Medical History:  Diagnosis Date  . Coronary artery disease   . Hyperlipidemia   . Hypertension    Past Surgical History: Past Surgical History:  Procedure Laterality Date  . CARDIAC CATHETERIZATION     Current Medications: Current Meds  Medication Sig  . ALLOPURINOL PO Take 300 mg by mouth daily.   . carvedilol (COREG) 12.5 MG tablet Take 12.5 mg by mouth 2 (two) times daily with a meal.   . EZETIMIBE PO Take 10 mg by mouth daily.   . hydrochlorothiazide (HYDRODIURIL) 25 MG tablet Take 25 mg by mouth daily.   . INDOMETHACIN PO Take by mouth as needed.   . niacin 500 MG tablet Take 1,000 mg by mouth at bedtime.  . [DISCONTINUED] aspirin EC 325 MG tablet Take 325 mg by mouth daily.    Allergies:    Amlodipine besylate, Penicillins, and Valsartan   Social History: Social History   Socioeconomic History  . Marital status: Married    Spouse name: Not on file  . Number of children: Not on file  . Years of education: Not on file  . Highest education level: Not on file  Occupational History  . Not on file  Social Needs  . Financial resource strain: Not on file  . Food insecurity    Worry: Not on file    Inability: Not on file  . Transportation needs    Medical: Not on  file    Non-medical: Not on file  Tobacco Use  . Smoking status: Former Smoker    Quit date: 05/24/2002    Years since quitting: 16.6  . Smokeless tobacco: Never Used  Substance and Sexual Activity  . Alcohol use: Not Currently    Frequency: Never  . Drug use: Never  . Sexual activity: Not Currently  Lifestyle  . Physical activity    Days per week: Not on file    Minutes per session: Not on file  . Stress: Not on file  Relationships  . Social Herbalist on phone: Not on file    Gets together: Not on file    Attends religious service: Not on file    Active member of club or organization: Not on file    Attends meetings of clubs or organizations: Not on file    Relationship status: Not on  file  Other Topics Concern  . Not on file  Social History Narrative  . Not on file    Family History: The patient's family history includes Heart disease in his mother.  ROS:   All other ROS reviewed and negative. Pertinent positives noted in the HPI.     EKGs/Labs/Other Studies Reviewed:   The following studies were personally reviewed by me today: Labs from primary care physician office, A1c 5.9, total cholesterol 140, HDL cholesterol 40, triglycerides 82, calculated LDL 78, creatinine 1.04  EKG:  EKG is ordered today.  The ekg ordered today demonstrates normal sinus rhythm, first-degree AV block, possibly inferior Q waves, no acute ischemic changes, normal intervals, and was personally reviewed by me.   Recent Labs: No results found for requested labs within last 8760 hours.   Recent Lipid Panel    Component Value Date/Time   CHOL 133 11/16/2008 0057   TRIG 120 11/16/2008 0057   HDL 41 11/16/2008 0057   CHOLHDL 3.2 Ratio 11/16/2008 0057   VLDL 24 11/16/2008 0057   LDLCALC 68 11/16/2008 0057    Physical Exam:   VS:  BP 100/60 (BP Location: Left Arm)   Pulse 74   Temp (!) 97.1 F (36.2 C)   Ht 5\' 9"  (1.753 m)   Wt 198 lb 3.2 oz (89.9 kg)   BMI 29.27 kg/m    Wt Readings from Last 3 Encounters:  01/12/19 198 lb 3.2 oz (89.9 kg)  02/24/07 193 lb (87.5 kg)    General: Well nourished, well developed, in no acute distress Heart: Atraumatic, normal size  Eyes: PEERLA, EOMI  Neck: Supple, no JVD Endocrine: No thryomegaly Cardiac: Normal S1, S2; RRR; no murmurs, rubs, or gallops Lungs: Clear to auscultation bilaterally, no wheezing, rhonchi or rales  Abd: Soft, nontender, no hepatomegaly  Ext: No edema, diminished pulses bilaterally Musculoskeletal: No deformities, BUE and BLE strength normal and equal Skin: Warm and dry, no rashes   Neuro: Alert and oriented to person, place, time, and situation, CNII-XII grossly intact, no focal deficits  Psych: Normal mood and  affect   ASSESSMENT:   NAME@ is a 68 y.o. male who presents for the following: 1. SOB (shortness of breath) on exertion   2. Coronary artery disease involving native coronary artery of native heart without angina pectoris   3. Essential hypertension   4. Pure hypercholesterolemia   5. Claudication in peripheral vascular disease ([place])     PLAN:   1. SOB (shortness of breath) on exertion -He presents with 6 to 8 months of worsening shortness of breath.  He has no evidence of clinical heart failure.  He reports no chest pressure or chest tightness to suggest an underlying diagnosed coronary disease.  He appears to have had a cardiac catheterization 2004 that I suspect showed nonobstructive coronary disease given that no intervention was pursued.  His LDL cholesterol is pristine, and he continues to refrain from tobacco. -We will obtain a resting echocardiogram to determine if he has any heart failure -Given his strong family history of coronary disease, smoking history, prior cardiac cath we will pursue a Lexiscan nuclear medicine perfusion study (this will be important as he has a planned orthopedic surgery in the coming months) -We will see him back in 6 weeks after the above testing  2. Coronary artery disease involving native coronary artery of native heart without angina pectoris -I suspect nonobstructive CAD based on no intervention from his cardiac cath in 2004 -We will reduce his aspirin 81 mg -Most recent LDL 78; continue current medication -Stress test as above  3. Essential hypertension -Well-controlled on current medications  4. Pure hypercholesterolemia -At goal  5. Claudication in peripheral vascular disease ([place]) -He has diminished pulses on examination, and does report intermittent claudication symptoms -We will pursue ABIs and lower extremity arterial duplexes at our next visit -He also needs a abdominal aortic aneurysm screening ultrasound   Disposition: Return in  about 6 weeks (around 02/23/2019).  Medication Adjustments/Labs and Tests Ordered: Current medicines are reviewed at length with the patient today.  Concerns regarding medicines are outlined above.  Orders Placed This Encounter  Procedures  . MYOCARDIAL PERFUSION IMAGING  . ECHOCARDIOGRAM COMPLETE   Meds ordered this encounter  Medications  . aspirin EC 81 MG tablet    Sig: Take 1 tablet (81 mg total) by mouth daily.    Dispense:  90 tablet    Refill:  3    Patient Instructions  Medication Instructions:  - DECREASE ENTERIC COATED ASPIRIN TO 81 MG  ONE TABLET DAILY   If you need a refill on your cardiac medications before your next appointment, please call your pharmacy.   Lab work:  Not needed   If you have labs (blood work) drawn today and your tests are completely normal, you will receive your results only by: Marland Kitchen MyChart Message (if you have MyChart) OR . A paper copy in the mail If you have any lab test that is abnormal or we need to change your treatment, we will call you to review the results.  Testing/Procedures:  WILL BE SCHEDULE AT [facility] has requested that you have a lexiscan myoview. For further information please visit [email or web address]. Please follow instruction sheet, as given.  AND  Your physician has requested that you have an echocardiogram. Echocardiography is a painless test that uses sound waves to create images of your heart. It provides your doctor with information about the size and shape of your heart and how well your heart's chambers and valves are working. This procedure takes approximately one hour. There are no restrictions for this procedure.    Follow-Up: At [facility], you and your health needs are our priority.  As part of our continuing mission to provide you with exceptional heart care, we have created designated Provider Care Teams.  These Care Teams include your primary Cardiologist  (physician) and Advanced Practice Providers (APPs -  Physician Assistants and Nurse Practitioners) who all work together to provide you with the care you need, when you need it.  Dr [name] recommends that you schedule a follow-up appointment in: 6 weeks    Any Other Special Instructions Will Be Listed Below (If Applicable).    Signed, Addison Naegeli. [name], [place]  [address], [place], [place] [address] [phone]  01/12/2019 10:28 AM

## 2019-01-12 NOTE — Patient Instructions (Addendum)
Medication Instructions:  - DECREASE ENTERIC COATED ASPIRIN TO 81 MG  ONE TABLET DAILY   If you need a refill on your cardiac medications before your next appointment, please call your pharmacy.   Lab work:  Not needed   If you have labs (blood work) drawn today and your tests are completely normal, you will receive your results only by: Marland Kitchen MyChart Message (if you have MyChart) OR . A paper copy in the mail If you have any lab test that is abnormal or we need to change your treatment, we will call you to review the results.  Testing/Procedures:  WILL BE SCHEDULE AT [facility] has requested that you have a lexiscan myoview. For further information please visit [email or web address]. Please follow instruction sheet, as given.  AND  Your physician has requested that you have an echocardiogram. Echocardiography is a painless test that uses sound waves to create images of your heart. It provides your doctor with information about the size and shape of your heart and how well your heart's chambers and valves are working. This procedure takes approximately one hour. There are no restrictions for this procedure.    Follow-Up: At [facility] you and your health needs are our priority.  As part of our continuing mission to provide you with exceptional heart care, we have created designated Provider Care Teams.  These Care Teams include your primary Cardiologist (physician) and Advanced Practice Providers (APPs -  Physician Assistants and Nurse Practitioners) who all work together to provide you with the care you need, when you need it. Dr [name] recommends that you schedule a follow-up appointment in: 6 weeks    Any Other Special Instructions Will Be Listed Below (If Applicable).

## 2019-01-15 ENCOUNTER — Other Ambulatory Visit (INDEPENDENT_AMBULATORY_CARE_PROVIDER_SITE_OTHER): Payer: Medicare Other

## 2019-01-15 DIAGNOSIS — I10 Essential (primary) hypertension: Secondary | ICD-10-CM

## 2019-01-15 DIAGNOSIS — I2510 Atherosclerotic heart disease of native coronary artery without angina pectoris: Secondary | ICD-10-CM

## 2019-01-15 DIAGNOSIS — R0602 Shortness of breath: Secondary | ICD-10-CM

## 2019-01-19 ENCOUNTER — Telehealth (HOSPITAL_COMMUNITY): Payer: Self-pay | Admitting: *Deleted

## 2019-01-19 NOTE — Telephone Encounter (Signed)
Patient given detailed instructions per Myocardial Perfusion Study Information Sheet for the test on 01/23/19 at 10:00. Patient notified to arrive 15 minutes early and that it is imperative to arrive on time for appointment to keep from having the test rescheduled.  If you need to cancel or reschedule your appointment, please call the office within 24 hours of your appointment. . Patient verbalized understanding.Warren [name]

## 2019-01-23 ENCOUNTER — Other Ambulatory Visit: Payer: Self-pay

## 2019-01-23 ENCOUNTER — Ambulatory Visit (HOSPITAL_BASED_OUTPATIENT_CLINIC_OR_DEPARTMENT_OTHER): Payer: Medicare Other

## 2019-01-23 ENCOUNTER — Ambulatory Visit (HOSPITAL_COMMUNITY): Payer: Medicare Other | Attending: Cardiovascular Disease

## 2019-01-23 DIAGNOSIS — I2510 Atherosclerotic heart disease of native coronary artery without angina pectoris: Secondary | ICD-10-CM | POA: Insufficient documentation

## 2019-01-23 DIAGNOSIS — R0602 Shortness of breath: Secondary | ICD-10-CM | POA: Diagnosis not present

## 2019-01-23 LAB — MYOCARDIAL PERFUSION IMAGING
LV dias vol: 60 mL (ref 62–150)
LV sys vol: 23 mL
Peak HR: 85 {beats}/min
Rest HR: 60 {beats}/min
SDS: 0
SRS: 0
SSS: 0
TID: 1.03

## 2019-01-23 MED ORDER — TECHNETIUM TC 99M TETROFOSMIN IV KIT
10.3000 | PACK | Freq: Once | INTRAVENOUS | Status: AC | PRN
  Administered 2019-01-23: 10.3 via INTRAVENOUS
  Filled 2019-01-23: qty 11

## 2019-01-23 MED ORDER — TECHNETIUM TC 99M TETROFOSMIN IV KIT
32.4000 | PACK | Freq: Once | INTRAVENOUS | Status: AC | PRN
  Administered 2019-01-23: 32.4 via INTRAVENOUS
  Filled 2019-01-23: qty 33

## 2019-01-23 MED ORDER — PERFLUTREN LIPID MICROSPHERE
1.0000 mL | INTRAVENOUS | Status: AC | PRN
  Administered 2019-01-23: 1 mL via INTRAVENOUS

## 2019-01-23 MED ORDER — REGADENOSON 0.4 MG/5ML IV SOLN
0.4000 mg | Freq: Once | INTRAVENOUS | Status: AC
  Administered 2019-01-23: 0.4 mg via INTRAVENOUS

## 2019-02-19 ENCOUNTER — Ambulatory Visit: Payer: Medicare Other | Admitting: Interventional Cardiology

## 2019-02-25 NOTE — Progress Notes (Signed)
Cardiology Office Note:   Date:  02/26/2019  NAME:  Warren [name]    MRN: [id number] DOB:  Sep 11, 1950   PCP:  [name], MD  Cardiologist:  [name], MD  Electrophysiologist:  None   Referring MD: [name], MD   Chief Complaint  Patient presents with  . Shortness of Breath    History of Present Illness:   Warren [name] is a 68 y.o. male with a hx of hypertension, hyperlipidemia, former tobacco abuse who presents for follow-up of his shortness of breath.  He underwent a recent echocardiogram as well as nuclear medicine myocardial perfusion imaging study on 9/1.  His echocardiogram, which I personally reviewed demonstrating normal LV ejection fraction.  His nuclear medicine perfusion imaging study, which I personally reviewed demonstrated no inducible ischemia with normal ejection fraction, a low risk study.  He reports his symptoms of shortness of breath have improved over the past several months.  He reports increasing his activity level, with reduction in symptoms.  He also reports the pain in his legs has improved.  This has coincided with recent steroid injections in his hips.  He does follow with orthopedic surgery, and they likely will proceed with hip replacement in the future.  His blood pressure is well controlled today on current medications.  Review of laboratory data shows a LDL cholesterol of 78 in May of this year.  Other labs are quite normal.  He denies any chest pain, shortness of breath, palpitations or other symptoms today.  We did reduce his aspirin to 81 mg on her last visit.  He reports he does have flushing with his niacin.  He is contemplating stopping this, my recommendation.  Past Medical History: Past Medical History:  Diagnosis Date  . Coronary artery disease   . Hyperlipidemia   . Hypertension     Past Surgical History: Past Surgical History:  Procedure Laterality Date  . CARDIAC CATHETERIZATION      Current Medications: Current Meds   Medication Sig  . ALLOPURINOL PO Take 300 mg by mouth daily.   Marland Kitchen aspirin EC 81 MG tablet Take 1 tablet (81 mg total) by mouth daily.  . carvedilol (COREG) 12.5 MG tablet Take 12.5 mg by mouth 2 (two) times daily with a meal.   . EZETIMIBE PO Take 10 mg by mouth daily.   . hydrochlorothiazide (HYDRODIURIL) 25 MG tablet Take 25 mg by mouth daily.   . INDOMETHACIN PO Take by mouth as needed.   . niacin 500 MG tablet Take 1,000 mg by mouth at bedtime.     Allergies:    Amlodipine besylate, Penicillins, and Valsartan   Social History: Social History   Socioeconomic History  . Marital status: Married    Spouse name: Not on file  . Number of children: Not on file  . Years of education: Not on file  . Highest education level: Not on file  Occupational History  . Not on file  Social Needs  . Financial resource strain: Not on file  . Food insecurity    Worry: Not on file    Inability: Not on file  . Transportation needs    Medical: Not on file    Non-medical: Not on file  Tobacco Use  . Smoking status: Former Smoker    Quit date: 05/24/2002    Years since quitting: 16.7  . Smokeless tobacco: Never Used  Substance and Sexual Activity  . Alcohol use: Not Currently  Frequency: Never  . Drug use: Never  . Sexual activity: Not Currently  Lifestyle  . Physical activity    Days per week: Not on file    Minutes per session: Not on file  . Stress: Not on file  Relationships  . Social Herbalist on phone: Not on file    Gets together: Not on file    Attends religious service: Not on file    Active member of club or organization: Not on file    Attends meetings of clubs or organizations: Not on file    Relationship status: Not on file  Other Topics Concern  . Not on file  Social History Narrative  . Not on file     Family History: The patient's family history includes Heart disease in his mother.  ROS:   All other ROS reviewed and negative. Pertinent positives  noted in the HPI.     EKGs/Labs/Other Studies Reviewed:   The following studies were personally reviewed by me today: Serum creatinine 1.04 hemoglobin 16.8 triglycerides 82 HDL 40 LDL 78  TTE (01/23/2019)  1. The left ventricle has normal systolic function, with an ejection fraction of 55-60%. The cavity size was normal. Left ventricular diastolic Doppler parameters are consistent with impaired relaxation.  2. The right ventricle has mildly reduced systolic function. The cavity was mildly enlarged. There is no increase in right ventricular wall thickness. Right ventricular systolic pressure is moderately elevated with an estimated pressure of 41.6 mmHg.  3. The aortic valve is grossly normal. Mild thickening of the aortic valve. No stenosis of the aortic valve.  4. The aorta is normal unless otherwise noted.  5. The atrial septum is grossly normal.  NM 01/23/2019  Nuclear stress EF: 61%.  There was no ST segment deviation noted during stress.  The study is normal.  This is a low risk study.  The left ventricular ejection fraction is normal (55-65%).  No ischemia.  Recent Labs: No results found for requested labs within last 8760 hours.   Recent Lipid Panel    Component Value Date/Time   CHOL 133 11/16/2008 0057   TRIG 120 11/16/2008 0057   HDL 41 11/16/2008 0057   CHOLHDL 3.2 Ratio 11/16/2008 0057   VLDL 24 11/16/2008 0057   LDLCALC 68 11/16/2008 0057    Physical Exam:   VS:  BP 120/83   Pulse 72   Ht 5\' 9"  (1.753 m)   Wt 196 lb (88.9 kg)   SpO2 93%   BMI 28.94 kg/m    Wt Readings from Last 3 Encounters:  02/26/19 196 lb (88.9 kg)  01/23/19 198 lb (89.8 kg)  01/12/19 198 lb 3.2 oz (89.9 kg)    General: Well nourished, well developed, in no acute distress Heart: Atraumatic, normal size  Eyes: PEERLA, EOMI  Neck: Supple, no JVD Endocrine: No thryomegaly Cardiac: Normal S1, S2; RRR; no murmurs, rubs, or gallops Lungs: Clear to auscultation bilaterally, no wheezing,  rhonchi or rales  Abd: Soft, nontender, no hepatomegaly  Ext: No edema, pulses 2+ Musculoskeletal: No deformities, BUE and BLE strength normal and equal Skin: Warm and dry, no rashes   Neuro: Alert and oriented to person, place, time, and situation, CNII-XII grossly intact, no focal deficits  Psych: Normal mood and affect   ASSESSMENT:   Warren [name] is a 68 y.o. male with a hx of hypertension, hyperlipidemia, former tobacco abuse who presents for follow-up of his shortness of breath.  PLAN:   1. SOB (shortness of breath) on exertion -Normal echocardiogram and normal stress test -Symptoms likely related to deconditioning, he will continue to exercise  2. Essential hypertension -At goal today and will continue current medications  3. Mixed hyperlipidemia -I instructed him to stop his niacin, and he will contemplate this -We will continue aspirin 81 mg  4. Claudication in peripheral vascular disease ([place]) -Reports symptoms have improved, and likely related to hip arthritis   Disposition: Return in about 1 year (around 02/26/2020).  Medication Adjustments/Labs and Tests Ordered: Current medicines are reviewed at length with the patient today.  Concerns regarding medicines are outlined above.  No orders of the defined types were placed in this encounter.  No orders of the defined types were placed in this encounter.   Patient Instructions  Medication Instructions:  No changes   Lab work: Not needed  Testing/Procedures: Not needed  Follow-Up: At [facility], you and your health needs are our priority.  As part of our continuing mission to provide you with exceptional heart care, we have created designated Provider Care Teams.  These Care Teams include your primary Cardiologist (physician) and Advanced Practice Providers (APPs -  Physician Assistants and Nurse Practitioners) who all work together to provide you with the care you need, when you need it. . Your physician  recommends that you schedule a follow-up appointment in: 12 months with Dr [name]  Any Other Special Instructions Will Be Listed Below (If Applicable).      Signed, Addison Naegeli. [name], [place]  [address], [place], [place] [address] [phone]  02/26/2019 8:30 AM

## 2019-02-26 ENCOUNTER — Other Ambulatory Visit: Payer: Self-pay

## 2019-02-26 ENCOUNTER — Encounter: Payer: Self-pay | Admitting: Cardiovascular Disease

## 2019-02-26 ENCOUNTER — Ambulatory Visit (INDEPENDENT_AMBULATORY_CARE_PROVIDER_SITE_OTHER): Payer: Medicare Other | Admitting: Cardiovascular Disease

## 2019-02-26 VITALS — BP 120/83 | HR 72 | Ht 69.0 in | Wt 196.0 lb

## 2019-02-26 DIAGNOSIS — I10 Essential (primary) hypertension: Secondary | ICD-10-CM | POA: Diagnosis not present

## 2019-02-26 DIAGNOSIS — I739 Peripheral vascular disease, unspecified: Secondary | ICD-10-CM

## 2019-02-26 DIAGNOSIS — E782 Mixed hyperlipidemia: Secondary | ICD-10-CM

## 2019-02-26 DIAGNOSIS — R0602 Shortness of breath: Secondary | ICD-10-CM | POA: Diagnosis not present

## 2019-02-26 NOTE — Patient Instructions (Signed)
Medication Instructions:  No changes   Lab work: Not needed  Testing/Procedures: Not needed  Follow-Up: At [facility], you and your health needs are our priority.  As part of our continuing mission to provide you with exceptional heart care, we have created designated Provider Care Teams.  These Care Teams include your primary Cardiologist (physician) and Advanced Practice Providers (APPs -  Physician Assistants and Nurse Practitioners) who all work together to provide you with the care you need, when you need it. . Your physician recommends that you schedule a follow-up appointment in: 12 months with Dr [name]  Any Other Special Instructions Will Be Listed Below (If Applicable).

## 2019-02-28 DIAGNOSIS — L570 Actinic keratosis: Secondary | ICD-10-CM | POA: Diagnosis not present

## 2019-02-28 DIAGNOSIS — Z08 Encounter for follow-up examination after completed treatment for malignant neoplasm: Secondary | ICD-10-CM | POA: Diagnosis not present

## 2019-02-28 DIAGNOSIS — Z23 Encounter for immunization: Secondary | ICD-10-CM | POA: Diagnosis not present

## 2019-02-28 DIAGNOSIS — X32XXXD Exposure to sunlight, subsequent encounter: Secondary | ICD-10-CM | POA: Diagnosis not present

## 2019-02-28 DIAGNOSIS — Z85828 Personal history of other malignant neoplasm of skin: Secondary | ICD-10-CM | POA: Diagnosis not present

## 2019-02-28 DIAGNOSIS — Z1283 Encounter for screening for malignant neoplasm of skin: Secondary | ICD-10-CM | POA: Diagnosis not present

## 2019-04-06 DIAGNOSIS — M25551 Pain in right hip: Secondary | ICD-10-CM | POA: Diagnosis not present

## 2019-04-06 DIAGNOSIS — M1611 Unilateral primary osteoarthritis, right hip: Secondary | ICD-10-CM | POA: Diagnosis not present

## 2019-04-12 DIAGNOSIS — E782 Mixed hyperlipidemia: Secondary | ICD-10-CM | POA: Diagnosis not present

## 2019-04-12 DIAGNOSIS — R7301 Impaired fasting glucose: Secondary | ICD-10-CM | POA: Diagnosis not present

## 2019-04-12 DIAGNOSIS — I10 Essential (primary) hypertension: Secondary | ICD-10-CM | POA: Diagnosis not present

## 2019-04-12 DIAGNOSIS — E785 Hyperlipidemia, unspecified: Secondary | ICD-10-CM | POA: Diagnosis not present

## 2019-04-17 DIAGNOSIS — E782 Mixed hyperlipidemia: Secondary | ICD-10-CM | POA: Diagnosis not present

## 2019-04-17 DIAGNOSIS — R911 Solitary pulmonary nodule: Secondary | ICD-10-CM | POA: Diagnosis not present

## 2019-04-17 DIAGNOSIS — R7301 Impaired fasting glucose: Secondary | ICD-10-CM | POA: Diagnosis not present

## 2019-04-17 DIAGNOSIS — Z0001 Encounter for general adult medical examination with abnormal findings: Secondary | ICD-10-CM | POA: Diagnosis not present

## 2019-04-17 DIAGNOSIS — M25551 Pain in right hip: Secondary | ICD-10-CM | POA: Diagnosis not present

## 2019-04-17 DIAGNOSIS — M79671 Pain in right foot: Secondary | ICD-10-CM | POA: Diagnosis not present

## 2019-04-17 DIAGNOSIS — H9193 Unspecified hearing loss, bilateral: Secondary | ICD-10-CM | POA: Diagnosis not present

## 2019-04-17 DIAGNOSIS — J309 Allergic rhinitis, unspecified: Secondary | ICD-10-CM | POA: Diagnosis not present

## 2019-04-17 DIAGNOSIS — M109 Gout, unspecified: Secondary | ICD-10-CM | POA: Diagnosis not present

## 2019-04-17 DIAGNOSIS — I10 Essential (primary) hypertension: Secondary | ICD-10-CM | POA: Diagnosis not present

## 2019-04-17 DIAGNOSIS — R945 Abnormal results of liver function studies: Secondary | ICD-10-CM | POA: Diagnosis not present

## 2019-04-18 ENCOUNTER — Other Ambulatory Visit (HOSPITAL_COMMUNITY): Payer: Self-pay | Admitting: Internal Medicine

## 2019-04-18 ENCOUNTER — Other Ambulatory Visit: Payer: Self-pay | Admitting: Internal Medicine

## 2019-04-18 DIAGNOSIS — Z87891 Personal history of nicotine dependence: Secondary | ICD-10-CM

## 2019-05-08 ENCOUNTER — Other Ambulatory Visit: Payer: Self-pay

## 2019-05-08 ENCOUNTER — Ambulatory Visit (HOSPITAL_COMMUNITY)
Admission: RE | Admit: 2019-05-08 | Discharge: 2019-05-08 | Disposition: A | Discharge status: Home / Self Care | Payer: Medicare Other | Source: Ambulatory Visit | Attending: Internal Medicine | Admitting: Internal Medicine

## 2019-05-08 DIAGNOSIS — Z87891 Personal history of nicotine dependence: Secondary | ICD-10-CM | POA: Diagnosis not present

## 2019-07-09 DIAGNOSIS — R945 Abnormal results of liver function studies: Secondary | ICD-10-CM | POA: Diagnosis not present

## 2019-07-09 DIAGNOSIS — R911 Solitary pulmonary nodule: Secondary | ICD-10-CM | POA: Diagnosis not present

## 2019-07-09 DIAGNOSIS — I10 Essential (primary) hypertension: Secondary | ICD-10-CM | POA: Diagnosis not present

## 2019-07-09 DIAGNOSIS — H9193 Unspecified hearing loss, bilateral: Secondary | ICD-10-CM | POA: Diagnosis not present

## 2019-07-09 DIAGNOSIS — R7301 Impaired fasting glucose: Secondary | ICD-10-CM | POA: Diagnosis not present

## 2019-07-09 DIAGNOSIS — E782 Mixed hyperlipidemia: Secondary | ICD-10-CM | POA: Diagnosis not present

## 2019-07-09 DIAGNOSIS — M79671 Pain in right foot: Secondary | ICD-10-CM | POA: Diagnosis not present

## 2019-07-09 DIAGNOSIS — J309 Allergic rhinitis, unspecified: Secondary | ICD-10-CM | POA: Diagnosis not present

## 2019-07-09 DIAGNOSIS — E7849 Other hyperlipidemia: Secondary | ICD-10-CM | POA: Diagnosis not present

## 2019-07-09 DIAGNOSIS — M109 Gout, unspecified: Secondary | ICD-10-CM | POA: Diagnosis not present

## 2019-07-09 DIAGNOSIS — Z0001 Encounter for general adult medical examination with abnormal findings: Secondary | ICD-10-CM | POA: Diagnosis not present

## 2019-07-19 DIAGNOSIS — Z23 Encounter for immunization: Secondary | ICD-10-CM | POA: Diagnosis not present

## 2019-08-17 DIAGNOSIS — Z23 Encounter for immunization: Secondary | ICD-10-CM | POA: Diagnosis not present

## 2019-08-23 DIAGNOSIS — R0602 Shortness of breath: Secondary | ICD-10-CM | POA: Diagnosis not present

## 2019-08-23 DIAGNOSIS — J301 Allergic rhinitis due to pollen: Secondary | ICD-10-CM | POA: Diagnosis not present

## 2019-08-23 DIAGNOSIS — I10 Essential (primary) hypertension: Secondary | ICD-10-CM | POA: Diagnosis not present

## 2019-08-23 DIAGNOSIS — I2510 Atherosclerotic heart disease of native coronary artery without angina pectoris: Secondary | ICD-10-CM | POA: Diagnosis not present

## 2019-08-23 DIAGNOSIS — R000 Tachycardia, unspecified: Secondary | ICD-10-CM | POA: Diagnosis not present

## 2019-08-31 ENCOUNTER — Other Ambulatory Visit (HOSPITAL_COMMUNITY): Payer: Self-pay | Admitting: Respiratory Therapy

## 2019-08-31 DIAGNOSIS — R0602 Shortness of breath: Secondary | ICD-10-CM

## 2019-08-31 DIAGNOSIS — J4540 Moderate persistent asthma, uncomplicated: Secondary | ICD-10-CM

## 2019-09-07 ENCOUNTER — Other Ambulatory Visit (HOSPITAL_COMMUNITY)
Admission: RE | Admit: 2019-09-07 | Discharge: 2019-09-07 | Disposition: A | Discharge status: Home / Self Care | Payer: Medicare Other | Source: Ambulatory Visit | Attending: Internal Medicine | Admitting: Internal Medicine

## 2019-09-07 ENCOUNTER — Other Ambulatory Visit: Payer: Self-pay

## 2019-09-07 DIAGNOSIS — Z20822 Contact with and (suspected) exposure to covid-19: Secondary | ICD-10-CM | POA: Insufficient documentation

## 2019-09-07 DIAGNOSIS — Z01812 Encounter for preprocedural laboratory examination: Secondary | ICD-10-CM | POA: Diagnosis not present

## 2019-09-08 LAB — SARS CORONAVIRUS 2 (TAT 6-24 HRS): SARS Coronavirus 2: NEGATIVE

## 2019-09-11 ENCOUNTER — Other Ambulatory Visit: Payer: Self-pay

## 2019-09-11 ENCOUNTER — Ambulatory Visit (HOSPITAL_COMMUNITY)
Admission: RE | Admit: 2019-09-11 | Discharge: 2019-09-11 | Disposition: A | Discharge status: Home / Self Care | Payer: Medicare Other | Source: Ambulatory Visit | Attending: Internal Medicine | Admitting: Internal Medicine

## 2019-09-11 DIAGNOSIS — J4540 Moderate persistent asthma, uncomplicated: Secondary | ICD-10-CM | POA: Diagnosis not present

## 2019-09-11 DIAGNOSIS — R0602 Shortness of breath: Secondary | ICD-10-CM | POA: Insufficient documentation

## 2019-09-11 LAB — PULMONARY FUNCTION TEST
DL/VA % pred: 26 %
DL/VA: 1.09 ml/min/mmHg/L
DLCO unc % pred: 25 %
DLCO unc: 6.53 ml/min/mmHg
FEF 25-75 Post: 1.32 L/sec
FEF 25-75 Pre: 1.37 L/sec
FEF2575-%Change-Post: -3 %
FEF2575-%Pred-Post: 52 %
FEF2575-%Pred-Pre: 54 %
FEV1-%Change-Post: -4 %
FEV1-%Pred-Post: 84 %
FEV1-%Pred-Pre: 87 %
FEV1-Post: 2.73 L
FEV1-Pre: 2.84 L
FEV1FVC-%Change-Post: -3 %
FEV1FVC-%Pred-Pre: 92 %
FEV6-%Change-Post: 0 %
FEV6-%Pred-Post: 94 %
FEV6-%Pred-Pre: 95 %
FEV6-Post: 3.9 L
FEV6-Pre: 3.94 L
FEV6FVC-%Change-Post: 0 %
FEV6FVC-%Pred-Post: 100 %
FEV6FVC-%Pred-Pre: 100 %
FVC-%Change-Post: 0 %
FVC-%Pred-Post: 93 %
FVC-%Pred-Pre: 94 %
FVC-Post: 4.11 L
FVC-Pre: 4.14 L
Post FEV1/FVC ratio: 66 %
Post FEV6/FVC ratio: 95 %
Pre FEV1/FVC ratio: 69 %
Pre FEV6/FVC Ratio: 95 %
RV % pred: 78 %
RV: 1.84 L
TLC % pred: 86 %
TLC: 5.91 L

## 2019-09-11 MED ORDER — ALBUTEROL SULFATE (2.5 MG/3ML) 0.083% IN NEBU
2.5000 mg | INHALATION_SOLUTION | Freq: Once | RESPIRATORY_TRACT | Status: AC
  Administered 2019-09-11: 2.5 mg via RESPIRATORY_TRACT

## 2019-09-13 DIAGNOSIS — M109 Gout, unspecified: Secondary | ICD-10-CM | POA: Diagnosis not present

## 2019-09-13 DIAGNOSIS — Z0001 Encounter for general adult medical examination with abnormal findings: Secondary | ICD-10-CM | POA: Diagnosis not present

## 2019-09-13 DIAGNOSIS — E7849 Other hyperlipidemia: Secondary | ICD-10-CM | POA: Diagnosis not present

## 2019-09-13 DIAGNOSIS — M79671 Pain in right foot: Secondary | ICD-10-CM | POA: Diagnosis not present

## 2019-09-13 DIAGNOSIS — H9193 Unspecified hearing loss, bilateral: Secondary | ICD-10-CM | POA: Diagnosis not present

## 2019-09-13 DIAGNOSIS — R911 Solitary pulmonary nodule: Secondary | ICD-10-CM | POA: Diagnosis not present

## 2019-09-13 DIAGNOSIS — R7301 Impaired fasting glucose: Secondary | ICD-10-CM | POA: Diagnosis not present

## 2019-09-13 DIAGNOSIS — E782 Mixed hyperlipidemia: Secondary | ICD-10-CM | POA: Diagnosis not present

## 2019-09-13 DIAGNOSIS — I10 Essential (primary) hypertension: Secondary | ICD-10-CM | POA: Diagnosis not present

## 2019-09-13 DIAGNOSIS — J309 Allergic rhinitis, unspecified: Secondary | ICD-10-CM | POA: Diagnosis not present

## 2019-09-13 DIAGNOSIS — R945 Abnormal results of liver function studies: Secondary | ICD-10-CM | POA: Diagnosis not present

## 2019-10-10 DIAGNOSIS — F430 Acute stress reaction: Secondary | ICD-10-CM | POA: Diagnosis not present

## 2019-10-10 DIAGNOSIS — E6609 Other obesity due to excess calories: Secondary | ICD-10-CM | POA: Diagnosis not present

## 2019-10-10 DIAGNOSIS — E785 Hyperlipidemia, unspecified: Secondary | ICD-10-CM | POA: Diagnosis not present

## 2019-10-10 DIAGNOSIS — I10 Essential (primary) hypertension: Secondary | ICD-10-CM | POA: Diagnosis not present

## 2019-10-10 DIAGNOSIS — C44310 Basal cell carcinoma of skin of unspecified parts of face: Secondary | ICD-10-CM | POA: Diagnosis not present

## 2019-10-10 DIAGNOSIS — E7849 Other hyperlipidemia: Secondary | ICD-10-CM | POA: Diagnosis not present

## 2019-10-10 DIAGNOSIS — E669 Obesity, unspecified: Secondary | ICD-10-CM | POA: Diagnosis not present

## 2019-10-10 DIAGNOSIS — E782 Mixed hyperlipidemia: Secondary | ICD-10-CM | POA: Diagnosis not present

## 2019-10-10 DIAGNOSIS — G4700 Insomnia, unspecified: Secondary | ICD-10-CM | POA: Diagnosis not present

## 2019-10-10 DIAGNOSIS — I2510 Atherosclerotic heart disease of native coronary artery without angina pectoris: Secondary | ICD-10-CM | POA: Diagnosis not present

## 2019-10-16 DIAGNOSIS — I10 Essential (primary) hypertension: Secondary | ICD-10-CM | POA: Diagnosis not present

## 2019-10-16 DIAGNOSIS — J309 Allergic rhinitis, unspecified: Secondary | ICD-10-CM | POA: Diagnosis not present

## 2019-10-16 DIAGNOSIS — R945 Abnormal results of liver function studies: Secondary | ICD-10-CM | POA: Diagnosis not present

## 2019-10-16 DIAGNOSIS — R7301 Impaired fasting glucose: Secondary | ICD-10-CM | POA: Diagnosis not present

## 2019-10-16 DIAGNOSIS — H9193 Unspecified hearing loss, bilateral: Secondary | ICD-10-CM | POA: Diagnosis not present

## 2019-10-16 DIAGNOSIS — M79671 Pain in right foot: Secondary | ICD-10-CM | POA: Diagnosis not present

## 2019-10-16 DIAGNOSIS — E782 Mixed hyperlipidemia: Secondary | ICD-10-CM | POA: Diagnosis not present

## 2019-10-16 DIAGNOSIS — R911 Solitary pulmonary nodule: Secondary | ICD-10-CM | POA: Diagnosis not present

## 2019-10-16 DIAGNOSIS — Z0001 Encounter for general adult medical examination with abnormal findings: Secondary | ICD-10-CM | POA: Diagnosis not present

## 2019-10-16 DIAGNOSIS — M109 Gout, unspecified: Secondary | ICD-10-CM | POA: Diagnosis not present

## 2019-10-16 DIAGNOSIS — M25551 Pain in right hip: Secondary | ICD-10-CM | POA: Diagnosis not present

## 2019-10-19 DIAGNOSIS — M79671 Pain in right foot: Secondary | ICD-10-CM | POA: Diagnosis not present

## 2019-10-19 DIAGNOSIS — J309 Allergic rhinitis, unspecified: Secondary | ICD-10-CM | POA: Diagnosis not present

## 2019-10-19 DIAGNOSIS — R945 Abnormal results of liver function studies: Secondary | ICD-10-CM | POA: Diagnosis not present

## 2019-10-19 DIAGNOSIS — R0902 Hypoxemia: Secondary | ICD-10-CM | POA: Diagnosis not present

## 2019-10-19 DIAGNOSIS — R911 Solitary pulmonary nodule: Secondary | ICD-10-CM | POA: Diagnosis not present

## 2019-10-19 DIAGNOSIS — Z0001 Encounter for general adult medical examination with abnormal findings: Secondary | ICD-10-CM | POA: Diagnosis not present

## 2019-10-19 DIAGNOSIS — I10 Essential (primary) hypertension: Secondary | ICD-10-CM | POA: Diagnosis not present

## 2019-10-19 DIAGNOSIS — E782 Mixed hyperlipidemia: Secondary | ICD-10-CM | POA: Diagnosis not present

## 2019-10-19 DIAGNOSIS — M25551 Pain in right hip: Secondary | ICD-10-CM | POA: Diagnosis not present

## 2019-10-19 DIAGNOSIS — J9611 Chronic respiratory failure with hypoxia: Secondary | ICD-10-CM | POA: Diagnosis not present

## 2019-10-19 DIAGNOSIS — H9193 Unspecified hearing loss, bilateral: Secondary | ICD-10-CM | POA: Diagnosis not present

## 2019-10-19 DIAGNOSIS — R7301 Impaired fasting glucose: Secondary | ICD-10-CM | POA: Diagnosis not present

## 2019-10-19 DIAGNOSIS — M109 Gout, unspecified: Secondary | ICD-10-CM | POA: Diagnosis not present

## 2019-10-23 DIAGNOSIS — Z9981 Dependence on supplemental oxygen: Secondary | ICD-10-CM | POA: Insufficient documentation

## 2019-10-29 ENCOUNTER — Other Ambulatory Visit: Payer: Self-pay

## 2019-10-29 ENCOUNTER — Encounter: Payer: Self-pay | Admitting: Internal Medicine

## 2019-10-29 ENCOUNTER — Ambulatory Visit (INDEPENDENT_AMBULATORY_CARE_PROVIDER_SITE_OTHER): Payer: Medicare Other | Admitting: Internal Medicine

## 2019-10-29 DIAGNOSIS — R0600 Dyspnea, unspecified: Secondary | ICD-10-CM

## 2019-10-29 DIAGNOSIS — J9611 Chronic respiratory failure with hypoxia: Secondary | ICD-10-CM

## 2019-10-29 DIAGNOSIS — J449 Chronic obstructive pulmonary disease, unspecified: Secondary | ICD-10-CM

## 2019-10-29 DIAGNOSIS — J8410 Pulmonary fibrosis, unspecified: Secondary | ICD-10-CM

## 2019-10-29 DIAGNOSIS — R0609 Other forms of dyspnea: Secondary | ICD-10-CM | POA: Insufficient documentation

## 2019-10-29 MED ORDER — ANORO ELLIPTA 62.5-25 MCG/INH IN AEPB: 1.0000 | INHALATION_SPRAY | Freq: Every day | RESPIRATORY_TRACT | 0 refills | Status: DC

## 2019-10-29 NOTE — Progress Notes (Signed)
Warren [name], male    DOB: 05/20/1951, 69 y.o.   MRN: [id number]   Brief patient profile:  68 yowm quit "just light" smoking 2004 and  Magazine features editor until 2017 p exp to HS in 2015/6 but no apparent  respiratory sequelae and nl activity tolerance = 3 miles on treadmill s stopping at 3 degrees gradient stopped doing this  at covid March 2020 then gradually worse ex tol esp since March 2021 p second shot of Moderna bit  no assoc cough  so referred to pulmonary clinic 10/29/2019 by Dr   Warren [name] p placed him on 2lpm at hs and with ex.      History of Present Illness  10/29/2019  Pulmonary/ 1st office eval/Warren [name]  Chief Complaint  Patient presents with  . Consult    Referred by Dr Warren [name] for history of SOB. Had a PFT on April 20th. Has been evaluated by cardiology. Using 2L of O2 at night and with exertion.   Dyspnea:  House to mb 50 ft uphill has to stop to get there s 02 Cough: none but does have pnds no better on xyzal, zyrtec, clariton allegra x years no better  ent eval > not helpful to date  Sleep: ok on 2lpm  SABA use: none  No obvious day to day or daytime variability or assoc excess/ purulent sputum or mucus plugs or hemoptysis or cp or chest tightness, subjective wheeze or overt   hb symptoms.   Sleeping as above without nocturnal  or early am exacerbation  of respiratory  c/o's or need for noct saba. Also denies any obvious fluctuation of symptoms with weather or environmental changes or other aggravating or alleviating factors except as outlined above   No unusual exposure hx or h/o childhood pna/ asthma or knowledge of premature birth.  Current Allergies, Complete Past Medical History, Past Surgical History, Family History, and Social History were reviewed in [employer] record.  ROS  The following are not active complaints unless bolded Hoarseness, sore throat, dysphagia, dental problems, itching, sneezing,  nasal congestion or discharge of excess mucus  or purulent secretions, ear ache,   fever, chills, sweats, unintended wt loss or wt gain, classically pleuritic or exertional cp,  orthopnea pnd or arm/hand swelling  or leg swelling, presyncope, palpitations, abdominal pain, anorexia, nausea, vomiting, diarrhea  or change in bowel habits or change in bladder habits, change in stools or change in urine, dysuria, hematuria,  rash, arthralgias, visual complaints, headache, numbness, weakness or ataxia or problems with walking or coordination,  change in mood or  memory.           Past Medical History:  Diagnosis Date  . Coronary artery disease   . Hyperlipidemia   . Hypertension     Outpatient Medications Prior to Visit  Medication Sig Dispense Refill  . ALLOPURINOL PO Take 300 mg by mouth daily.     Marland Kitchen aspirin EC 81 MG tablet Take 1 tablet (81 mg total) by mouth daily. 90 tablet 3  . carvedilol (COREG) 12.5 MG tablet Take 12.5 mg by mouth 2 (two) times daily with a meal.     . EZETIMIBE PO Take 10 mg by mouth daily.     . hydrochlorothiazide (HYDRODIURIL) 25 MG tablet Take 25 mg by mouth daily.     . INDOMETHACIN PO Take by mouth as needed.     . niacin 500 MG tablet Take 1,000 mg by mouth at bedtime.  Objective:     BP 124/82   Pulse 87   Temp 98.4 F (36.9 C) (Oral)   Ht 5\' 9"  (1.753 m)   Wt 187 lb 3.2 oz (84.9 kg)   SpO2 93% Comment: on RA  BMI 27.64 kg/m   SpO2: 93 %(on RA)   amb wm nad   HEENT : pt wearing mask not removed for exam due to covid - 19 concerns.   NECK :  without JVD/Nodes/TM/ nl carotid upstrokes bilaterally   LUNGS: no acc muscle use,  Min barrel  contour chest wall with bilateral  slightly decreased bs s audible wheeze and  without cough on insp or exp maneuvers and min  Hyperresonant  to  percussion bilaterally     CV:  RRR  no s3 or murmur or increase in P2, and no edema   ABD:  soft and nontender with pos end  insp Hoover's  in the supine position. No bruits or organomegaly appreciated,  bowel sounds nl  MS:   Nl gait/  ext warm without deformities, calf tenderness, cyanosis or clubbing No obvious joint restrictions   SKIN: warm and dry without lesions    NEURO:  alert, approp, nl sensorium with  no motor or cerebellar deficits apparent.      I personally reviewed images and agree with radiology impression as follows:   LDCT  05/08/19 No pneumothorax. No pleural effusion. Mild-to-moderate centrilobular emphysema. No acute consolidative airspace disease or lung masses. No significant growth of previously visualized scattered pulmonary nodules. No new significant pulmonary nodules.             Assessment   No problem-specific Assessment & Plan notes found for this encounter.     Warren Gully, MD 10/29/2019

## 2019-10-29 NOTE — Patient Instructions (Addendum)
2lpm at bedtime and Make sure you check your oxygen saturations at highest level of activity to be sure it stays over 90% and adjust upward to maintain this level if needed but remember to turn it back to previous settings when you stop (to conserve your supply).   Anoro one click each am - take a take at least two good drags  We will call you to set up a HRCT chest next available and go over the results then.    Please remember to go to the lab department   for your tests - we will call you with the results when they are available.     Add : needs alpha one screening  if emphysema is the main finding on HRCT

## 2019-10-30 ENCOUNTER — Encounter: Payer: Self-pay | Admitting: Internal Medicine

## 2019-10-30 DIAGNOSIS — J449 Chronic obstructive pulmonary disease, unspecified: Secondary | ICD-10-CM | POA: Insufficient documentation

## 2019-10-30 DIAGNOSIS — J9611 Chronic respiratory failure with hypoxia: Secondary | ICD-10-CM | POA: Insufficient documentation

## 2019-10-30 LAB — CBC WITH DIFFERENTIAL/PLATELET
Basophils Absolute: 0.1 10*3/uL (ref 0.0–0.1)
Basophils Relative: 1.2 % (ref 0.0–3.0)
Eosinophils Absolute: 0.2 10*3/uL (ref 0.0–0.7)
Eosinophils Relative: 1.5 % (ref 0.0–5.0)
HCT: 52.9 % — ABNORMAL HIGH (ref 39.0–52.0)
Hemoglobin: 17.9 g/dL — ABNORMAL HIGH (ref 13.0–17.0)
Lymphocytes Relative: 17.5 % (ref 12.0–46.0)
Lymphs Abs: 1.8 10*3/uL (ref 0.7–4.0)
MCHC: 33.9 g/dL (ref 30.0–36.0)
MCV: 97.6 fl (ref 78.0–100.0)
Monocytes Absolute: 1.2 10*3/uL — ABNORMAL HIGH (ref 0.1–1.0)
Monocytes Relative: 11.8 % (ref 3.0–12.0)
Neutro Abs: 7.1 10*3/uL (ref 1.4–7.7)
Neutrophils Relative %: 68 % (ref 43.0–77.0)
Platelets: 240 10*3/uL (ref 150.0–400.0)
RBC: 5.42 Mil/uL (ref 4.22–5.81)
RDW: 13.9 % (ref 11.5–15.5)
WBC: 10.4 10*3/uL (ref 4.0–10.5)

## 2019-10-30 LAB — BRAIN NATRIURETIC PEPTIDE: Pro B Natriuretic peptide (BNP): 145 pg/mL — ABNORMAL HIGH (ref 0.0–100.0)

## 2019-10-30 LAB — SEDIMENTATION RATE: Sed Rate: 30 mm/hr — ABNORMAL HIGH (ref 0–20)

## 2019-10-30 NOTE — Assessment & Plan Note (Signed)
Started on 02 by Dr [name] 09/2019  - 10/29/2019   Walked approx 50 ft -@ slow  pace stopped due to desat to 83% only improved to 90% while walking on  2lpm continuous , not pulsed.   As of 10/29/2019  rec 2lpm hs and titrate walking to > 90% but not a candidate for POC          Each maintenance medication was reviewed in detail including emphasizing most importantly the difference between maintenance and prns and under what circumstances the prns are to be triggered using an action plan format where appropriate.  Total time for H and P, chart review, counseling, teaching device/  directly observing portions of ambulatory 02 saturation study/  and generating customized AVS unique to this office visit / charting = 60 min

## 2019-10-30 NOTE — Assessment & Plan Note (Signed)
Quit "lightly" smoking in 2004 - PFT's  09/11/19   FEV1 2.84 (84 % ) ratio 0.69  p no % improvement from saba p 0 prior to study with DLCO  6.53 (25%) corrects to 1.09 (26%)  for alv volume and FV curve classically concave  - 10/29/2019  After extensive coaching inhaler device,  effectiveness =    99% with elipta > anoro x 2 week sample    If hrct predominantly shows emphysema then he also needs alpha one testing at some point but for now :  Pt is Group B in terms of symptom/risk and laba/lama therefore appropriate rx at this point >>>  anoro approp trial

## 2019-10-30 NOTE — Assessment & Plan Note (Addendum)
Onset of symptoms ? Summer of 2020 much worse p 2nd moderna March 2021  - Placed on 02 by Dr [name] 09/2019 >>> see 02 study 10/29/2019 under chronic resp failure - Collagen vasc dz screen 10/29/2019 >>> - HSP serololy  10/29/2019 >>>   - HRCT chest ordered   I strongly suspect he has component of ILD based on hx that may not have been detected  on screening CT 6 months agao  so needs HRCT next and screen now for HSP/ collagen vasc dz   I don't think the HS exp put him at risk of lung dz as it is mainly a cns toxin.  I can't shed any light on the moderna connection though if this is an inflammatory problem I can see how turning on the immune system might adversely affect his ILD, but we'll know more p HRCT and labs available   Discussed in detail all the  indications, usual  risks and alternatives  relative to the benefits with patient who agrees to proceed with w/u as outlined.

## 2019-11-01 LAB — CYCLIC CITRUL PEPTIDE ANTIBODY, IGG: Cyclic Citrullin Peptide Ab: <16 UNITS

## 2019-11-01 LAB — RHEUMATOID FACTOR: Rheumatoid fact SerPl-aCnc: <14 IU/mL (ref <14)

## 2019-11-01 LAB — ANTI-NUCLEAR AB-TITER (ANA TITER): ANA Titer 1: 1:40 {titer} — ABNORMAL HIGH

## 2019-11-01 LAB — ANA: Anti Nuclear Antibody (ANA): POSITIVE — AB

## 2019-11-02 LAB — HYPERSENSITIVITY PNEUMONITIS
A. Pullulans Abs: NEGATIVE
A.Fumigatus #1 Abs: NEGATIVE
Micropolyspora faeni, IgG: NEGATIVE
Pigeon Serum Abs: NEGATIVE
Thermoact. Saccharii: NEGATIVE
Thermoactinomyces vulgaris, IgG: NEGATIVE

## 2019-11-05 ENCOUNTER — Ambulatory Visit (HOSPITAL_COMMUNITY)
Admission: RE | Admit: 2019-11-05 | Discharge: 2019-11-05 | Disposition: A | Discharge status: Home / Self Care | Payer: Medicare Other | Source: Ambulatory Visit | Attending: Internal Medicine | Admitting: Internal Medicine

## 2019-11-05 ENCOUNTER — Other Ambulatory Visit: Payer: Self-pay

## 2019-11-05 DIAGNOSIS — J8410 Pulmonary fibrosis, unspecified: Secondary | ICD-10-CM | POA: Insufficient documentation

## 2019-11-05 DIAGNOSIS — R0602 Shortness of breath: Secondary | ICD-10-CM | POA: Diagnosis not present

## 2019-11-05 NOTE — Progress Notes (Signed)
Spoke with pt and notified of results per Dr. Wert. Pt verbalized understanding and denied any questions. 

## 2019-11-07 ENCOUNTER — Other Ambulatory Visit: Payer: Self-pay | Admitting: Internal Medicine

## 2019-11-07 MED ORDER — ANORO ELLIPTA 62.5-25 MCG/INH IN AEPB: 1.0000 | INHALATION_SPRAY | Freq: Every day | RESPIRATORY_TRACT | 0 refills | Status: DC

## 2019-11-07 NOTE — Progress Notes (Signed)
Spoke with pt and notified of results per Dr. Wert. Pt verbalized understanding and denied any questions. 

## 2019-11-10 NOTE — Progress Notes (Signed)
Cardiology Office Note:   Date:  11/12/2019  NAME:  Warren [name]    MRN: [id number] DOB:  January 18, 1951   PCP:  [name], MD  Cardiologist:  [name], MD   Referring MD: [name], MD   Chief Complaint  Patient presents with  . Follow-up   History of Present Illness:   Warren [name] is a 69 y.o. male with a hx of HLD who presents for follow-up. Seen last year for SOB. Normal echo and normal NM stress test.  He reports he is had episodes of shortness of breath and hypoxia.  He apparently underwent testing for exertional hypoxia and was found to have profound hypoxemia with exertion.  He desaturates into the 70-80 range when he is not her oxygen therapy.  He was prescribed oxygen by his primary care physician.  He is recently been evaluated by pulmonary.  He had a high-resolution chest CT which did not show any evidence of interstitial lung disease.  He did have three-vessel coronary artery calcifications.  I did reassure him that his most recent stress test was normal.  A BNP value was obtained which was 145.  His examination today is not consistent with heart failure.  He denies any chest pain or pressure.  The symptoms appear to be exertional dyspnea.  I did review his PFTs which show mild obstructive airway disease as well as severe diffusion defect.  I have informed him that I will need to reach out to him pulmonary to determine what is the etiology of the perfusion defect and what needs to be done.  His echocardiogram in September showed mild pulmonary hypertension and we do need to repeat this.  I am uncertain if he is thinking that there possibly is exertional pulmonary hypertension here or any other cardiac etiology will need work-up.  He reports that he has only seen his pulmonologist once and still in the middle of the work-up.  I did review his CT scan there is no evidence of pulmonary edema or any fluid in the lungs.    Problem List 1. HLD -T chol 140, HDL 40, TG 82, LDL  78 2. HTN -A1c 5.9 3.  Mild pulmonary hypertension -RVSP 44 on last echo in September 2020 4.  CAD -Three-vessel calcification seen on recent lung CT -Nuclear medicine stress test normal September 2020  Past Medical History: Past Medical History:  Diagnosis Date  . Coronary artery disease   . Hyperlipidemia   . Hypertension     Past Surgical History: Past Surgical History:  Procedure Laterality Date  . CARDIAC CATHETERIZATION      Current Medications: Current Meds  Medication Sig  . allopurinol (ZYLOPRIM) 300 MG tablet Take 300 mg by mouth daily.  . ALLOPURINOL PO Take 300 mg by mouth daily.   Marland Kitchen aspirin EC 81 MG tablet Take 1 tablet (81 mg total) by mouth daily.  . carvedilol (COREG) 12.5 MG tablet Take 12.5 mg by mouth 2 (two) times daily with a meal.   . EZETIMIBE PO Take 10 mg by mouth daily.   . Flaxseed, Linseed, (FLAX SEED OIL) 1000 MG CAPS flaxseed oil 1,000 mg capsule  . hydrochlorothiazide (HYDRODIURIL) 25 MG tablet Take 25 mg by mouth daily.   . INDOMETHACIN PO Take by mouth as needed.   . niacin 500 MG tablet Take 1,000 mg by mouth at bedtime.  . OXYGEN Inhale into the lungs.  . umeclidinium-vilanterol (ANORO ELLIPTA) 62.5-25 MCG/INH AEPB Inhale 1  puff into the lungs daily.     Allergies:    Amlodipine besylate, Penicillins, and Valsartan   Social History: Social History   Socioeconomic History  . Marital status: Married    Spouse name: Not on file  . Number of children: Not on file  . Years of education: Not on file  . Highest education level: Not on file  Occupational History  . Not on file  Tobacco Use  . Smoking status: Former Smoker    Quit date: 05/24/2002    Years since quitting: 17.4  . Smokeless tobacco: Never Used  Substance and Sexual Activity  . Alcohol use: Not Currently  . Drug use: Never  . Sexual activity: Not Currently  Other Topics Concern  . Not on file  Social History Narrative  . Not on file   Social Determinants of  Health   Financial Resource Strain:   . Difficulty of Paying Living Expenses:   Food Insecurity:   . Worried About Charity fundraiser in the Last Year:   . Arboriculturist in the Last Year:   Transportation Needs:   . Film/video editor (Medical):   Marland Kitchen Lack of Transportation (Non-Medical):   Physical Activity:   . Days of Exercise per Week:   . Minutes of Exercise per Session:   Stress:   . Feeling of Stress :   Social Connections:   . Frequency of Communication with Friends and Family:   . Frequency of Social Gatherings with Friends and Family:   . Attends Religious Services:   . Active Member of Clubs or Organizations:   . Attends Archivist Meetings:   Marland Kitchen Marital Status:      Family History: The patient's family history includes Heart disease in his mother.  ROS:   All other ROS reviewed and negative. Pertinent positives noted in the HPI.     EKGs/Labs/Other Studies Reviewed:   The following studies were personally reviewed by me today:  NM SPECT 01/23/2019 -normal perfusion   TTE 01/23/2019 1. The left ventricle has normal systolic function, with an ejection  fraction of 55-60%. The cavity size was normal. Left ventricular diastolic  Doppler parameters are consistent with impaired relaxation.  2. The right ventricle has mildly reduced systolic function. The cavity  was mildly enlarged. There is no increase in right ventricular wall  thickness. Right ventricular systolic pressure is moderately elevated with  an estimated pressure of 41.6 mmHg.  3. The aortic valve is grossly normal. Mild thickening of the aortic  valve. No stenosis of the aortic valve.  4. The aorta is normal unless otherwise noted.  5. The atrial septum is grossly normal.   Recent Labs: 10/29/2019: Hemoglobin 17.9; Platelets 240.0; Pro B Natriuretic peptide (BNP) 145.0   Recent Lipid Panel    Component Value Date/Time   CHOL 133 11/16/2008 0057   TRIG 120 11/16/2008 0057   HDL  41 11/16/2008 0057   CHOLHDL 3.2 Ratio 11/16/2008 0057   VLDL 24 11/16/2008 0057   LDLCALC 68 11/16/2008 0057    Physical Exam:   VS:  BP 122/82   Pulse 66   Temp 98.1 F (36.7 C)   Ht 5\' 9"  (1.753 m)   Wt 190 lb 9.6 oz (86.5 kg)   SpO2 96%   BMI 28.15 kg/m    Wt Readings from Last 3 Encounters:  11/12/19 190 lb 9.6 oz (86.5 kg)  10/29/19 187 lb 3.2 oz (84.9 kg)  02/26/19 196  lb (88.9 kg)    General: Well nourished, well developed, in no acute distress Heart: Atraumatic, normal size  Eyes: PEERLA, EOMI  Neck: Supple, no JVD Endocrine: No thryomegaly Cardiac: Normal S1, S2; RRR; no murmurs, rubs, or gallops Lungs: Clear to auscultation bilaterally, no wheezing, rhonchi or rales  Abd: Soft, nontender, no hepatomegaly  Ext: No edema, pulses 2+ Musculoskeletal: No deformities, BUE and BLE strength normal and equal Skin: Warm and dry, no rashes   Neuro: Alert and oriented to person, place, time, and situation, CNII-XII grossly intact, no focal deficits  Psych: Normal mood and affect   ASSESSMENT:   Warren [name] is a 69 y.o. male who presents for the following: 1. SOB (shortness of breath)   2. Palpitations   3. Mixed hyperlipidemia   4. Essential hypertension   5. Coronary artery disease involving native coronary artery of native heart without angina pectoris     PLAN:   1. SOB (shortness of breath) -BNP 145.  This is mildly elevated.  This could be elevated due to right heart strain in the setting of hypoxia.  He has no evidence of congestive heart failure on examination.  I did review his CT scan which shows three-vessel CAD.  He had a normal nuclear medicine stress test in September 2020.  He has no symptoms of angina such as chest pain or pressure.  He did have pulmonary function testing which shows mild obstructive airway disease as well as severe diffusion defect.  His echocardiogram in 2020 showed grade 1 diastolic dysfunction and a mildly elevated RVSP.  He has no  stigmata of primary pulmonary hypertension.  I do not know what to make out of the severe perfusion defect.  We will reach out to his pulmonologist to determine his thoughts on what is going on here.  To me given his very normal cardiac exam and normal work-up in September, I believe this is more lung pathology.  Mr. Kimmons is quite frustrated but he is only seen his pulmonologist once.  He reports that he was told by phone all of his tests were normal.  We will go ahead and repeat an echocardiogram given his change in symptoms a new diagnosis.  He also reports that his heart rate increases when he exerts himself.  I suspect he is getting hypoxic and his heart rate secondarily increases.  We will go ahead and pursue a 3-day Zio patch to exclude any significant arrhythmia.  Depending on thoughts of his pulmonologist, may need to pursue heart catheterizations.  However, at this time I see no need given a normal stress test and really unremarkable echocardiogram.  Should his echocardiogram come back abnormal we can revisit this.  2. Palpitations -Heart rate increases when he exerts himself.  I suspect this is secondary to hypoxia.  We will exclude arrhythmia with a 3-day Zio patch.  3. Mixed hyperlipidemia -Most recent lipid levels acceptable.  No change in medications.  4. Essential hypertension -Blood pressure acceptable today.  No change to carvedilol or HCTZ.  5. Coronary artery disease involving native coronary artery of native heart without angina pectoris -Three-vessel calcifications on CT scan.  Normal nuclear medicine stress test in 2020.  No symptoms of angina.  We will wait on further aggressive cardiac evaluation pending thoughts from pulmonology.  Disposition: Return in about 6 weeks (around 12/24/2019).  Medication Adjustments/Labs and Tests Ordered: Current medicines are reviewed at length with the patient today.  Concerns regarding medicines are outlined above.  Orders Placed This Encounter   Procedures  . LONG TERM MONITOR (3-14 DAYS)  . ECHOCARDIOGRAM COMPLETE   No orders of the defined types were placed in this encounter.   Patient Instructions  Medication Instructions:  The current medical regimen is effective;  continue present plan and medications.  *If you need a refill on your cardiac medications before your next appointment, please call your pharmacy*   Testing/Procedures: Echocardiogram - Your physician has requested that you have an echocardiogram. Echocardiography is a painless test that uses sound waves to create images of your heart. It provides your doctor with information about the size and shape of your heart and how well your heart's chambers and valves are working. This procedure takes approximately one hour. There are no restrictions for this procedure. This will be performed at our [facility] location - [address].  Your physician has recommended that you wear a 3 DAY ZIO-PATCH monitor. The Zio patch cardiac monitor continuously records heart rhythm data for up to 14 days, this is for patients being evaluated for multiple types heart rhythms. For the first 24 hours post application, please avoid getting the Zio monitor wet in the shower or by excessive sweating during exercise. After that, feel free to carry on with regular activities. Keep soaps and lotions away from the ZIO XT Patch.  This will be mailed to you, please expect 7-10 days to receive.         Follow-Up: At [facility], you and your health needs are our priority.  As part of our continuing mission to provide you with exceptional heart care, we have created designated Provider Care Teams.  These Care Teams include your primary Cardiologist (physician) and Advanced Practice Providers (APPs -  Physician Assistants and Nurse Practitioners) who all work together to provide you with the care you need, when you need it.  We recommend signing up for the patient portal called  "MyChart".  Sign up information is provided on this After Visit Summary.  MyChart is used to connect with patients for Virtual Visits (Telemedicine).  Patients are able to view lab/test results, encounter notes, upcoming appointments, etc.  Non-urgent messages can be sent to your provider as well.   To learn more about what you can do with MyChart, go to [email or web address].    Your next appointment:   6 week(s)  The format for your next appointment:   In Person  Provider:   Eleonore Chiquito, MD       Time Spent with Patient: I have spent a total of 35 minutes with patient reviewing hospital notes, telemetry, EKGs, labs and examining the patient as well as establishing an assessment and plan that was discussed with the patient.  > 50% of time was spent in direct patient care.  Signed, Addison Naegeli. [name], [place]  [address] [place], [place] [address] [phone]  11/12/2019 10:42 AM

## 2019-11-12 ENCOUNTER — Encounter: Payer: Self-pay | Admitting: Cardiovascular Disease

## 2019-11-12 ENCOUNTER — Telehealth: Payer: Self-pay | Admitting: *Deleted

## 2019-11-12 ENCOUNTER — Other Ambulatory Visit: Payer: Self-pay

## 2019-11-12 ENCOUNTER — Ambulatory Visit (INDEPENDENT_AMBULATORY_CARE_PROVIDER_SITE_OTHER): Payer: Medicare Other | Admitting: Cardiovascular Disease

## 2019-11-12 VITALS — BP 122/82 | HR 66 | Temp 98.1°F | Ht 69.0 in | Wt 190.6 lb

## 2019-11-12 DIAGNOSIS — R002 Palpitations: Secondary | ICD-10-CM | POA: Diagnosis not present

## 2019-11-12 DIAGNOSIS — R0602 Shortness of breath: Secondary | ICD-10-CM

## 2019-11-12 DIAGNOSIS — I10 Essential (primary) hypertension: Secondary | ICD-10-CM

## 2019-11-12 DIAGNOSIS — I2510 Atherosclerotic heart disease of native coronary artery without angina pectoris: Secondary | ICD-10-CM

## 2019-11-12 DIAGNOSIS — E782 Mixed hyperlipidemia: Secondary | ICD-10-CM | POA: Diagnosis not present

## 2019-11-12 NOTE — Addendum Note (Signed)
Addended by: Caprice Beaver T on: 11/12/2019 03:18 PM   Modules accepted: Orders

## 2019-11-12 NOTE — Patient Instructions (Signed)
Medication Instructions:  The current medical regimen is effective;  continue present plan and medications.  *If you need a refill on your cardiac medications before your next appointment, please call your pharmacy*   Testing/Procedures: Echocardiogram - Your physician has requested that you have an echocardiogram. Echocardiography is a painless test that uses sound waves to create images of your heart. It provides your doctor with information about the size and shape of your heart and how well your hearts chambers and valves are working. This procedure takes approximately one hour. There are no restrictions for this procedure. This will be performed at our [facility] location - [address], [address].  Your physician has recommended that you wear a 3 DAY ZIO-PATCH monitor. The Zio patch cardiac monitor continuously records heart rhythm data for up to 14 days, this is for patients being evaluated for multiple types heart rhythms. For the first 24 hours post application, please avoid getting the Zio monitor wet in the shower or by excessive sweating during exercise. After that, feel free to carry on with regular activities. Keep soaps and lotions away from the ZIO XT Patch.  This will be mailed to you, please expect 7-10 days to receive.         Follow-Up: At [facility], you and your health needs are our priority.  As part of our continuing mission to provide you with exceptional heart care, we have created designated Provider Care Teams.  These Care Teams include your primary Cardiologist (physician) and Advanced Practice Providers (APPs -  Physician Assistants and Nurse Practitioners) who all work together to provide you with the care you need, when you need it.  We recommend signing up for the patient portal called "MyChart".  Sign up information is provided on this After Visit Summary.  MyChart is used to connect with patients for Virtual Visits (Telemedicine).  Patients are able to  view lab/test results, encounter notes, upcoming appointments, etc.  Non-urgent messages can be sent to your provider as well.   To learn more about what you can do with MyChart, go to [email or web address].    Your next appointment:   6 week(s)  The format for your next appointment:   In Person  Provider:   Eleonore Chiquito, MD

## 2019-11-12 NOTE — Telephone Encounter (Signed)
-----   Message from Tanda Rockers, MD sent at 11/12/2019  1:09 PM EDT ----- Regarding: RE: [facility] patient I reviewed his case - thought for sure he would have ILD but doesn't which means he should improve ex tol on adequate 02 supplmentation and if sats  still can't be maintained I wonder if he could have a R to L shunt?  See you're planning echo ? Can you add bubble study ?   Call me on my cell if you still have questions - I will get him back to go over the data and walk him on his 02 system to verify above.   Ronalee Belts  [phone]  ----- Message ----- From: Geralynn Rile, MD Sent: 11/12/2019   8:46 AM EDT To: Tanda Rockers, MD Subject: [facility] patient                                 Dr. Melvyn Novas:  Can you give me a call regarding Mr. Eveland? [phone]. He is a bit unsure of his diagnosis and I wanted to discuss him a bit more.   [place] T. [name], [place]  [address], [place], [place] [address] [phone]  8:47 AM

## 2019-11-12 NOTE — Addendum Note (Signed)
Addended by: Caprice Beaver T on: 11/12/2019 01:26 PM   Modules accepted: Orders

## 2019-11-12 NOTE — Telephone Encounter (Signed)
Per MW- ok to overbook in [place] same time as a consult  ATC the pt, NA and no option to leave msg [facility]

## 2019-11-13 ENCOUNTER — Telehealth: Payer: Self-pay | Admitting: *Deleted

## 2019-11-13 ENCOUNTER — Other Ambulatory Visit: Payer: Self-pay

## 2019-11-13 ENCOUNTER — Telehealth: Payer: Self-pay | Admitting: Cardiovascular Disease

## 2019-11-13 DIAGNOSIS — R0602 Shortness of breath: Secondary | ICD-10-CM

## 2019-11-13 NOTE — Telephone Encounter (Signed)
Testing was ordered at hospital.

## 2019-11-13 NOTE — Addendum Note (Signed)
Addended by: Ena Dawley on: 11/13/2019 03:39 PM   Modules accepted: Orders

## 2019-11-13 NOTE — Telephone Encounter (Signed)
Spoke with patient regarding appointment for VQ scan scheduled Tuesday 11/20/19 at [facility]---arrival time is 8:30 am 1st floor admissions office---chest x-ray scheduled for 8:45 am and VQ scan 9:00 am.  Patient voiced his understanding.

## 2019-11-13 NOTE — Progress Notes (Signed)
Testing order per Dr.[name].

## 2019-11-13 NOTE — Telephone Encounter (Signed)
Called Mr. Trieu. I am concerned about this severe diffuse defect. We will go ahead and get a lung V/Q scan to exclude chronic PE. Echo with bubble pending. Zio patch pending. Instructed him to keep his appointment with pulmonary.   [place] T. [name], [place]  [address], [place], [place] [address] [phone]  1:32 PM

## 2019-11-13 NOTE — Telephone Encounter (Signed)
Routed call to [place].

## 2019-11-15 ENCOUNTER — Other Ambulatory Visit (INDEPENDENT_AMBULATORY_CARE_PROVIDER_SITE_OTHER): Payer: Medicare Other

## 2019-11-15 DIAGNOSIS — R002 Palpitations: Secondary | ICD-10-CM | POA: Diagnosis not present

## 2019-11-20 ENCOUNTER — Ambulatory Visit (HOSPITAL_COMMUNITY)
Admission: RE | Admit: 2019-11-20 | Discharge: 2019-11-20 | Disposition: A | Discharge status: Home / Self Care | Payer: Medicare Other | Source: Ambulatory Visit | Attending: Cardiovascular Disease | Admitting: Cardiovascular Disease

## 2019-11-20 ENCOUNTER — Other Ambulatory Visit: Payer: Self-pay | Admitting: Cardiovascular Disease

## 2019-11-20 DIAGNOSIS — R0602 Shortness of breath: Secondary | ICD-10-CM

## 2019-11-20 MED ORDER — TECHNETIUM TO 99M ALBUMIN AGGREGATED
4.3000 | Freq: Once | INTRAVENOUS | Status: AC | PRN
  Administered 2019-11-20: 4.3 via INTRAVENOUS

## 2019-11-20 NOTE — Telephone Encounter (Signed)
Spoke with the pt and scheduled the pt for 11/22/19 at 9 am

## 2019-11-22 ENCOUNTER — Encounter: Payer: Self-pay | Admitting: Internal Medicine

## 2019-11-22 ENCOUNTER — Other Ambulatory Visit: Payer: Self-pay

## 2019-11-22 ENCOUNTER — Ambulatory Visit (INDEPENDENT_AMBULATORY_CARE_PROVIDER_SITE_OTHER): Payer: Medicare Other | Admitting: Internal Medicine

## 2019-11-22 DIAGNOSIS — J8410 Pulmonary fibrosis, unspecified: Secondary | ICD-10-CM | POA: Diagnosis not present

## 2019-11-22 DIAGNOSIS — J449 Chronic obstructive pulmonary disease, unspecified: Secondary | ICD-10-CM | POA: Diagnosis not present

## 2019-11-22 DIAGNOSIS — I2510 Atherosclerotic heart disease of native coronary artery without angina pectoris: Secondary | ICD-10-CM

## 2019-11-22 DIAGNOSIS — J9611 Chronic respiratory failure with hypoxia: Secondary | ICD-10-CM

## 2019-11-22 NOTE — Patient Instructions (Signed)
Make sure you check your oxygen saturations at highest level of activity to be sure it stays over 90% and adjust upward to maintain this level if needed but remember to turn it back to previous settings when you stop (to conserve your supply).   Think of anoro like high octane fuel and stop/start a week at a time  Change appt to 6-8 weeks

## 2019-11-22 NOTE — Progress Notes (Signed)
Warren [name], male    DOB: May 15, 1951, 69 y.o.   MRN: [id number]   Brief patient profile:  68 yowm quit "just light" smoking 2004 and  Magazine features editor until 2017 p exp to HS in 2015/6 but no apparent  respiratory sequelae and nl activity tolerance = 3 miles on treadmill s stopping at 3 degrees gradient stopped doing this  at covid March 2020 then gradually worse ex tol esp since March 2021 p second shot of Moderna bit  no assoc cough  so referred to pulmonary clinic 10/29/2019 by Dr   [name] p placed him on 2lpm at hs and with ex.      History of Present Illness  10/29/2019  Pulmonary/ 1st office eval/Warren [name]  Chief Complaint  Patient presents with  . Consult    Referred by Dr Allyn Kenner for history of SOB. Had a PFT on April 20th. Has been evaluated by cardiology. Using 2L of O2 at night and with exertion.   Dyspnea:  House to mb 50 ft uphill has to stop to get there s 02 Cough: none but does have pnds no better on xyzal, zyrtec, clariton allegra x years no better  ent eval > not helpful to date  Sleep: ok on 2lpm  SABA use: none rec 2lpm at bedtime and Make sure you check your oxygen saturations at highest level of activity to be sure it stays over 16% Anoro one click each am - take a take at least two good drags We will call you to set up a HRCT chest next available and go over the results then.      -  11/12/19 echo with bubble study done >>>  Pending as of 11/22/2019    11/22/2019  f/u ov/Warren [name] re:  Minimal improvement on Anoro Chief Complaint  Patient presents with  . Follow-up    Breathing is about the same since the last visit.   Dyspnea: housebound due to doe Cough: none Sleeping: ok bed is flat /  SABA use: none on anoro  02: 2lpm at bedside   At rest 02 94 %  Room to room s 02/ not following recs to titrate   No obvious day to day or daytime variability or assoc excess/ purulent sputum or mucus plugs or hemoptysis or cp or chest tightness, subjective wheeze or overt sinus  or hb symptoms.   Sleeping  without nocturnal  or early am exacerbation  of respiratory  c/o's or need for noct saba. Also denies any obvious fluctuation of symptoms with weather or environmental changes or other aggravating or alleviating factors except as outlined above   No unusual exposure hx or h/o childhood pna/ asthma or knowledge of premature birth.  Current Allergies, Complete Past Medical History, Past Surgical History, Family History, and Social History were reviewed in [employer] record.  ROS  The following are not active complaints unless bolded Hoarseness, sore throat, dysphagia, dental problems, itching, sneezing,  nasal congestion or discharge of excess mucus or purulent secretions, ear ache,   fever, chills, sweats, unintended wt loss or wt gain, classically pleuritic or exertional cp,  orthopnea pnd or arm/hand swelling  or leg swelling, presyncope, palpitations, abdominal pain, anorexia, nausea, vomiting, diarrhea  or change in bowel habits or change in bladder habits, change in stools or change in urine, dysuria, hematuria,  rash, arthralgias, visual complaints, headache, numbness, weakness or ataxia or problems with walking or coordination,  change in mood or  memory.  Current Meds  Medication Sig  . allopurinol (ZYLOPRIM) 300 MG tablet Take 300 mg by mouth daily.  . ALLOPURINOL PO Take 300 mg by mouth daily.   Marland Kitchen aspirin EC 81 MG tablet Take 1 tablet (81 mg total) by mouth daily.  . carvedilol (COREG) 12.5 MG tablet Take 12.5 mg by mouth 2 (two) times daily with a meal.   . EZETIMIBE PO Take 10 mg by mouth daily.   . Flaxseed, Linseed, (FLAX SEED OIL) 1000 MG CAPS flaxseed oil 1,000 mg capsule  . hydrochlorothiazide (HYDRODIURIL) 25 MG tablet Take 25 mg by mouth daily.   . INDOMETHACIN PO Take by mouth as needed.   . niacin 500 MG tablet Take 1,000 mg by mouth at bedtime.  . OXYGEN Inhale into the lungs.  . umeclidinium-vilanterol (ANORO  ELLIPTA) 62.5-25 MCG/INH AEPB Inhale 1 puff into the lungs daily.             Past Medical History:  Diagnosis Date  . Coronary artery disease   . Hyperlipidemia   . Hypertension          Objective:     Wt Readings from Last 3 Encounters:  11/22/19 190 lb (86.2 kg)  11/12/19 190 lb 9.6 oz (86.5 kg)  10/29/19 187 lb 3.2 oz (84.9 kg)     Vital signs reviewed - Note on arrival 02 sats  89% on RA        amb somber wm nad  HEENT : pt wearing mask not removed for exam due to covid - 19 concerns.   NECK :  without JVD/Nodes/TM/ nl carotid upstrokes bilaterally   LUNGS: no acc muscle use,  Min barrel  contour chest wall with bilateral  slightly decreased bs s audible wheeze and  without cough on insp or exp maneuvers and min  Hyperresonant  to  percussion bilaterally     CV:  RRR  no s3 or murmur or increase in P2, and no edema   ABD:  soft and nontender with pos end  insp Hoover's  in the supine position. No bruits or organomegaly appreciated, bowel sounds nl  MS:   Nl gait/  ext warm without deformities, calf tenderness, cyanosis or clubbing No obvious joint restrictions   SKIN: warm and dry without lesions    NEURO:  alert, approp, nl sensorium with  no motor or cerebellar deficits apparent.

## 2019-11-23 ENCOUNTER — Encounter: Payer: Self-pay | Admitting: Internal Medicine

## 2019-11-23 NOTE — Assessment & Plan Note (Signed)
Quit "lightly" smoking in 2004 - PFT's  09/11/19   FEV1 2.84 (84 % ) ratio 0.69  p no % improvement from saba p 0 prior to study with DLCO  6.53 (25%) corrects to 1.09 (26%)  for alv volume and FV curve classically concave  - 10/29/2019  After extensive coaching inhaler device,  effectiveness =    99% with elipta > anoro x 2 week sample > not convinced better 11/22/2019 so rec one week on vs one week off and d/c if not better ex tol but correct 02 first (see separate a/p)

## 2019-11-23 NOTE — Assessment & Plan Note (Addendum)
Started on 02 by Dr [name] 09/2019  - 10/29/2019   Walked approx 50 ft -@ slow  pace stopped due to desat to 83% only improved to 90% while walking on  2lpm continuous , not pulsed -  11/12/19 echo with bubble study done >>> -  11/22/2019   Walked after desat to 88% 2lpm o2 was increased to 3lpm cont and sats increased to 94%-- walked an additional 300 ft  and sats at end were 91% 3lpm/no SOB/average pace/   As of 11/22/2019  rec 2lpm hs and titrate walking to > 90% but not a candidate for POC as needed 3lpm cont to prevent desats in office   F/u in 6-8 weeks         Each maintenance medication was reviewed in detail including emphasizing most importantly the difference between maintenance and prns and under what circumstances the prns are to be triggered using an action plan format where appropriate.  Total time for H and P, chart review, counseling,  directly observing portions of ambulatory 02 saturation study/  and generating customized AVS unique to this office visit / charting = 20 min

## 2019-11-23 NOTE — Assessment & Plan Note (Signed)
Onset of symptoms ? Summer of 2020 much worse p 2nd moderna March 2021  - Placed on 02 by Dr [name] 09/2019 >>> see 02 study 10/29/2019 under chronic resp failure - Collagen vasc dz screen 10/29/2019 >>>  ESR 30  ANA pos 1:40  - HSP serololy  10/29/2019 >>> neg - HRCT chest 11/06/2019 >>> 1. No imaging findings to suggest interstitial lung disease. 2. Multiple small pulmonary nodules measuring 5 mm or less in size, stable compared to prior examinations dating back to 2016, considered definitively benign  Not clear why his dlco is so low but it does correlate with his desats which can be corrected with supplemental 02 > no further w/u needed for now, just better adherence to 02 (see separate a/p)

## 2019-11-29 ENCOUNTER — Other Ambulatory Visit (HOSPITAL_COMMUNITY): Payer: Medicare Other

## 2019-12-05 ENCOUNTER — Ambulatory Visit (HOSPITAL_COMMUNITY): Payer: Medicare Other | Attending: Cardiology

## 2019-12-05 ENCOUNTER — Other Ambulatory Visit: Payer: Self-pay

## 2019-12-05 ENCOUNTER — Telehealth: Payer: Self-pay | Admitting: Cardiovascular Disease

## 2019-12-05 DIAGNOSIS — R0602 Shortness of breath: Secondary | ICD-10-CM | POA: Diagnosis not present

## 2019-12-05 MED ORDER — PERFLUTREN LIPID MICROSPHERE
1.0000 mL | INTRAVENOUS | Status: AC | PRN
  Administered 2019-12-05: 1 mL via INTRAVENOUS

## 2019-12-05 MED ORDER — SODIUM CHLORIDE 0.9% FLUSH
10.0000 mL | INTRAVENOUS | Status: AC | PRN
Start: 2019-12-05 — End: ?
  Administered 2019-12-05: 20 mL via INTRAVENOUS

## 2019-12-05 NOTE — Telephone Encounter (Signed)
Discussed findings with Warren [name]. Findings concerning for rapid development of pulmonary hypertension. DLCO is low. I am very concerned for pulmonary hypertension that may need treatment. We will need to pursue right and left heart cath to exclude HFpEF and see what his mPAP is with [facility].   Will reach out to Dr. Melvyn Novas as well.   [place] T. [name], [place]  [address] [place], [place] [address] [phone]  6:06 PM

## 2019-12-06 ENCOUNTER — Encounter: Payer: Self-pay | Admitting: Internal Medicine

## 2019-12-06 DIAGNOSIS — I2781 Cor pulmonale (chronic): Secondary | ICD-10-CM | POA: Insufficient documentation

## 2019-12-06 DIAGNOSIS — R002 Palpitations: Secondary | ICD-10-CM | POA: Diagnosis not present

## 2019-12-07 ENCOUNTER — Telehealth: Payer: Self-pay | Admitting: Cardiovascular Disease

## 2019-12-07 NOTE — Telephone Encounter (Signed)
Called and spoke with pt, reports he received a notification from this morning regarding a long term monitor appt on 11/15/19 Pt reports he was not sure what this notification was from and that he had already done his monitor and sent it back in. Notified I was not sure why he received and notification regarding a "long-term monitor appt" but that I would send this message to one of the monitor technicians for clarification. Pt verbalized understanding.

## 2019-12-07 NOTE — Telephone Encounter (Signed)
Double booked patient for Monday- he called into triage and had other concerns.  Patient was placed on schedule.

## 2019-12-07 NOTE — Telephone Encounter (Signed)
Returned call to patient and explained the appt made was for the date he applied his monitor. We have to make an appt to import and bill for his monitor.

## 2019-12-07 NOTE — Telephone Encounter (Signed)
Pt calling in states that Dr.[name] called him on Wednesday to review his echo results and was told that one of his staff was to call him to get him scheduled for a sooner appt with Dr.[name] to discuss a heart cath. Almyra Free LPN assisted with scheduling pt for appt on 12/10/19 at 9:00 am. No other questions from pt at this time.

## 2019-12-07 NOTE — Telephone Encounter (Signed)
Patient is calling in regards to long term monitor appointment on 11/15/19. He states he received a notification today, 12/07/19, regarding that appointment and he is requesting clarification as to why he received that notification. Please call.

## 2019-12-09 NOTE — Progress Notes (Signed)
Cardiology Office Note:   Date:  12/10/2019  NAME:  Warren [name]    MRN: [id number] DOB:  04/29/1951   PCP:  Hall, John Z, MD  Cardiologist:  [place] T [name], MD   Referring MD: Hall, John Z, MD   Chief Complaint  Patient presents with  . pulmonary hypertension   History of Present Illness:   Warren [name] is a 69 y.o. male with a hx of HTN, HLD, pulmonary hypertension who presents for follow-up. He has had rather rapid onset of SOB associated with severe diffusion defect. CT negative for ILD. Echo shows moderate to severe pHTN. Rapid progressions. Needs LHC/RHC and lab work for primary pHTN. He reports he is no better.  He still profoundly short of breath with exertion.  The only abnormality I can find is a severe diffusion defect.  His VQ scan was negative.  His basic rheumatologic work-up was negative.  I do have plans for more intense laboratory data today.  He is okay with this.  We will plan for left and right heart catheterization with Dr. Behnsimon a later this week.   EKG today demonstrates normal sinus rhythm with nonspecific ST-T changes.  Problem List 1. Moderate to severe pulmonary HTN -RVSP 44 -> 68 (2020->2021) -PFT 08/31/2019: no obstruction, severe diffusion defect (DCLO 25%) -CT negative for ILD -ANA negative, anti-CCP negative, anti-RA negative -VQ scan negative CTEPH 2. HLD -T chol 140, HDL 40, TG 82, LDL 78 3. HTN -A1c 5.9 4.  CAD -Three-vessel calcification seen on recent lung CT -Nuclear medicine stress test normal September 2020  Past Medical History: Past Medical History:  Diagnosis Date  . Coronary artery disease   . Hyperlipidemia   . Hypertension     Past Surgical History: Past Surgical History:  Procedure Laterality Date  . CARDIAC CATHETERIZATION      Current Medications: Current Meds  Medication Sig  . allopurinol (ZYLOPRIM) 300 MG tablet Take 300 mg by mouth daily.  . ALLOPURINOL PO Take 300 mg by mouth daily.   . aspirin EC 81 MG  tablet Take 1 tablet (81 mg total) by mouth daily.  . carvedilol (COREG) 12.5 MG tablet Take 12.5 mg by mouth 2 (two) times daily with a meal.   . EZETIMIBE PO Take 10 mg by mouth daily.   . Flaxseed, Linseed, (FLAX SEED OIL) 1000 MG CAPS flaxseed oil 1,000 mg capsule  . hydrochlorothiazide (HYDRODIURIL) 25 MG tablet Take 25 mg by mouth daily.   . INDOMETHACIN PO Take by mouth as needed.   . niacin 500 MG tablet Take 1,000 mg by mouth at bedtime.  . OXYGEN Inhale into the lungs.  . umeclidinium-vilanterol (ANORO ELLIPTA) 62.5-25 MCG/INH AEPB Inhale 1 puff into the lungs daily.     Allergies:    Amlodipine besylate, Penicillins, and Valsartan   Social History: Social History   Socioeconomic History  . Marital status: Married    Spouse name: Not on file  . Number of children: Not on file  . Years of education: Not on file  . Highest education level: Not on file  Occupational History  . Not on file  Tobacco Use  . Smoking status: Former Smoker    Quit date: 05/24/2002    Years since quitting: 17.5  . Smokeless tobacco: Never Used  Substance and Sexual Activity  . Alcohol use: Not Currently  . Drug use: Never  . Sexual activity: Not Currently  Other Topics Concern  . Not on file    Social History Narrative  . Not on file   Social Determinants of Health   Financial Resource Strain:   . Difficulty of Paying Living Expenses:   Food Insecurity:   . Worried About Running Out of Food in the Last Year:   . Ran Out of Food in the Last Year:   Transportation Needs:   . Lack of Transportation (Medical):   . Lack of Transportation (Non-Medical):   Physical Activity:   . Days of Exercise per Week:   . Minutes of Exercise per Session:   Stress:   . Feeling of Stress :   Social Connections:   . Frequency of Communication with Friends and Family:   . Frequency of Social Gatherings with Friends and Family:   . Attends Religious Services:   . Active Member of Clubs or Organizations:    . Attends Club or Organization Meetings:   . Marital Status:      Family History: The patient's family history includes Heart disease in his mother.  ROS:   All other ROS reviewed and negative. Pertinent positives noted in the HPI.     EKGs/Labs/Other Studies Reviewed:   The following studies were personally reviewed by me today:  EKG:  EKG is ordered today.  The ekg ordered today demonstrates normal sinus rhythm, heart rate 74, no acute ST-T changes, and was personally reviewed by me.   TTE 12/05/2019 1. Left ventricular ejection fraction, by estimation, is 50 to 55%. The  left ventricle has low normal function. The left ventricle has no regional  wall motion abnormalities. There is mild left ventricular hypertrophy.  Left ventricular diastolic  parameters are consistent with Grade I diastolic dysfunction (impaired  relaxation). There is the interventricular septum is flattened in systole  and diastole, consistent with right ventricular pressure and volume  overload.  2. Right ventricular systolic function is moderately reduced. The right  ventricular size is mildly enlarged. There is severely elevated pulmonary  artery systolic pressure. The estimated right ventricular systolic  pressure is 68.5 mmHg.  3. The mitral valve is normal in structure. Mild mitral valve  regurgitation. No evidence of mitral stenosis.  4. The aortic valve is tricuspid. Aortic valve regurgitation is not  visualized. Mild aortic valve sclerosis is present, with no evidence of  aortic valve stenosis.  5. The inferior vena cava is normal in size with greater than 50%  respiratory variability, suggesting right atrial pressure of 3 mmHg.  6. Agitated saline contrast bubble study was negative, with no evidence  of any interatrial shunt.   Recent Labs: 10/29/2019: Hemoglobin 17.9; Platelets 240.0; Pro B Natriuretic peptide (BNP) 145.0   Recent Lipid Panel    Component Value Date/Time   CHOL 133  11/16/2008 0057   TRIG 120 11/16/2008 0057   HDL 41 11/16/2008 0057   CHOLHDL 3.2 Ratio 11/16/2008 0057   VLDL 24 11/16/2008 0057   LDLCALC 68 11/16/2008 0057    Physical Exam:   VS:  BP 112/84   Pulse 74   Ht 5' 9" (1.753 m)   Wt 187 lb 9.6 oz (85.1 kg)   SpO2 93%   BMI 27.70 kg/m    Wt Readings from Last 3 Encounters:  12/10/19 187 lb 9.6 oz (85.1 kg)  11/22/19 190 lb (86.2 kg)  11/12/19 190 lb 9.6 oz (86.5 kg)    General: Well nourished, well developed, in no acute distress Heart: Atraumatic, normal size  Eyes: PEERLA, EOMI  Neck: Supple, no JVD Endocrine: No thryomegaly   Cardiac: Normal S1, S2; RRR; no murmurs, rubs, or gallops Lungs: Clear to auscultation bilaterally, no wheezing, rhonchi or rales  Abd: Soft, nontender, no hepatomegaly  Ext: No edema, pulses 2+ Musculoskeletal: No deformities, BUE and BLE strength normal and equal Skin: Warm and dry, no rashes   Neuro: Alert and oriented to person, place, time, and situation, CNII-XII grossly intact, no focal deficits  Psych: Normal mood and affect   ASSESSMENT:   Warren [name] is a 69 y.o. male who presents for the following: 1. Pulmonary hypertension, unspecified (HCC)   2. Shortness of breath   3. Palpitations   4. Essential hypertension   5. Mixed hyperlipidemia     PLAN:   1. Pulmonary hypertension, unspecified (HCC) 2. Shortness of breath 3. Palpitations -Echo with moderate pulmonary hypertension.  RSVP around 68.  This is a change from 44.  He has a severe perfusion defect on pulmonary function testing.  No obstructive lung disease. -Bubble study negative on echo.  CT negative for interstitial lung disease. -ANA negative, anti-CCP negative, anti-RA negative -VQ scan negative CTEPH -Something clearly is going on.  Primary pulmonary hypertension?.  He has no stigmata of rheumatologic disease.  We will complete his work-up with ANCA's, anti-SCL 70, anti-double-stranded DNA, anticentromere, anti-Ro,  anti-SSA/SSB, anti-RNA polymerase 3. -I would also like for him to have a right and left heart catheterization with Dr. Daniel Bensimhon.  He will have this done Friday morning at 9:00.  We will also ask him to do a vasodilator challenge. -I am in discussion with his pulmonologist about any other testing he may need.  I think we are in the early phase of what ever is going on.  Should this be primary pulmonary hypertension I suspect it likely be rheumatological related.  Hopefully will find an abnormal lab.  4. Essential hypertension -No change in medication.  5. Mixed hyperlipidemia -No change current medications  Disposition: Return in about 1 month (around 01/10/2020).  Medication Adjustments/Labs and Tests Ordered: Current medicines are reviewed at length with the patient today.  Concerns regarding medicines are outlined above.  Orders Placed This Encounter  Procedures  . ANA+ENA+DNA/DS+Scl 70+SjoSSA/B  . Anti-RNA Polymerase III (RDL)  . Centromere Antibodies  . ANCA TITERS  . HIV Antibody (routine testing w rflx)  . Basic metabolic panel  . CBC  . Sed Rate (ESR)  . C-reactive protein  . Sjogrens syndrome-A extractable nuclear antibody  . EKG 12-Lead   No orders of the defined types were placed in this encounter.   Patient Instructions  Medication Instructions:  Hold HCTZ day of Cath   *If you need a refill on your cardiac medications before your next appointment, please call your pharmacy*   Lab Work: CBC, BMET and other labs   If you have labs (blood work) drawn today and your tests are completely normal, you will receive your results only by: . MyChart Message (if you have MyChart) OR . A paper copy in the mail If you have any lab test that is abnormal or we need to change your treatment, we will call you to review the results.   Testing/Procedures: Your physician has requested that you have a cardiac catheterization. Cardiac catheterization is used to diagnose  and/or treat various heart conditions. Doctors may recommend this procedure for a number of different reasons. The most common reason is to evaluate chest pain. Chest pain can be a symptom of coronary artery disease (CAD), and cardiac catheterization can show whether plaque   is narrowing or blocking your heart's arteries. This procedure is also used to evaluate the valves, as well as measure the blood flow and oxygen levels in different parts of your heart. For further information please visit [email or web address]. Please follow instruction sheet, as given.    Follow-Up: At [facility], you and your health needs are our priority.  As part of our continuing mission to provide you with exceptional heart care, we have created designated Provider Care Teams.  These Care Teams include your primary Cardiologist (physician) and Advanced Practice Providers (APPs -  Physician Assistants and Nurse Practitioners) who all work together to provide you with the care you need, when you need it.  We recommend signing up for the patient portal called "MyChart".  Sign up information is provided on this After Visit Summary.  MyChart is used to connect with patients for Virtual Visits (Telemedicine).  Patients are able to view lab/test results, encounter notes, upcoming appointments, etc.  Non-urgent messages can be sent to your provider as well.   To learn more about what you can do with MyChart, go to [email or web address].    Your next appointment:   1 month(s)  The format for your next appointment:   In Person  Provider:   Warren [name], MD   Other Instructions     [facility] [address] [place] [address] Dept: [phone] Loc: [phone]  Warren [name]  12/10/2019  You are scheduled for a Cardiac Catheterization on Friday, July 23 with Dr. Daniel Bensimhon.  1. Please arrive at the [facility] (Main  Entrance A) at [facility]: [address] , [place] [address] at 7:00 AM (This time is two hours before your procedure to ensure your preparation). Free valet parking service is available.   Special note: Every effort is made to have your procedure done on time. Please understand that emergencies sometimes delay scheduled procedures.  2. Diet: Do not eat solid foods after midnight.  The patient may have clear liquids until 5am upon the day of the procedure.  3. Labs: You will need to have blood drawn today- CBC, BMET. You do not need to be fasting.  4. Medication instructions in preparation for your procedure:   Contrast Allergy: No  Stop taking, HTCZ (Hydrochlorothiazide) Friday, July 23,  On the morning of your procedure, take your Aspirin and any morning medicines NOT listed above.  You may use sips of water.  5. Plan for one night stay--bring personal belongings. 6. Bring a current list of your medications and current insurance cards. 7. You MUST have a responsible person to drive you home. 8. Someone MUST be with you the first 24 hours after you arrive home or your discharge will be delayed. 9. Please wear clothes that are easy to get on and off and wear slip-on shoes.  Thank you for allowing us to care for you!   -- [facility]     Time Spent with Patient: I have spent a total of 35 minutes with patient reviewing hospital notes, telemetry, EKGs, labs and examining the patient as well as establishing an assessment and plan that was discussed with the patient.  > 50% of time was spent in direct patient care.  Signed, [place] T. [name], MD   [facility]  [address], [address] , [place] [address] [phone]  12/10/2019 9:28 AM     

## 2019-12-09 NOTE — H&P (View-Only) (Signed)
Cardiology Office Note:   Date:  12/10/2019  NAME:  Warren [name]    MRN: [id number] DOB:  10-20-1950   PCP:  [name], MD  Cardiologist:  [name], MD   Referring MD: [name], MD   Chief Complaint  Patient presents with  . pulmonary hypertension   History of Present Illness:   Warren [name] is a 69 y.o. male with a hx of HTN, HLD, pulmonary hypertension who presents for follow-up. He has had rather rapid onset of SOB associated with severe diffusion defect. CT negative for ILD. Echo shows moderate to severe pHTN. Rapid progressions. Needs LHC/RHC and lab work for primary pHTN. He reports he is no better.  He still profoundly short of breath with exertion.  The only abnormality I can find is a severe diffusion defect.  His VQ scan was negative.  His basic rheumatologic work-up was negative.  I do have plans for more intense laboratory data today.  He is okay with this.  We will plan for left and right heart catheterization with Dr. Simeon Craft a later this week.   EKG today demonstrates normal sinus rhythm with nonspecific ST-T changes.  Problem List 1. Moderate to severe pulmonary HTN -RVSP 44 -> 68 (2020->2021) -PFT 08/31/2019: no obstruction, severe diffusion defect (DCLO 25%) -CT negative for ILD -ANA negative, anti-CCP negative, anti-RA negative -VQ scan negative CTEPH 2. HLD -T chol 140, HDL 40, TG 82, LDL 78 3. HTN -A1c 5.9 4.  CAD -Three-vessel calcification seen on recent lung CT -Nuclear medicine stress test normal September 2020  Past Medical History: Past Medical History:  Diagnosis Date  . Coronary artery disease   . Hyperlipidemia   . Hypertension     Past Surgical History: Past Surgical History:  Procedure Laterality Date  . CARDIAC CATHETERIZATION      Current Medications: Current Meds  Medication Sig  . allopurinol (ZYLOPRIM) 300 MG tablet Take 300 mg by mouth daily.  . ALLOPURINOL PO Take 300 mg by mouth daily.   Marland Kitchen aspirin EC 81 MG  tablet Take 1 tablet (81 mg total) by mouth daily.  . carvedilol (COREG) 12.5 MG tablet Take 12.5 mg by mouth 2 (two) times daily with a meal.   . EZETIMIBE PO Take 10 mg by mouth daily.   . Flaxseed, Linseed, (FLAX SEED OIL) 1000 MG CAPS flaxseed oil 1,000 mg capsule  . hydrochlorothiazide (HYDRODIURIL) 25 MG tablet Take 25 mg by mouth daily.   . INDOMETHACIN PO Take by mouth as needed.   . niacin 500 MG tablet Take 1,000 mg by mouth at bedtime.  . OXYGEN Inhale into the lungs.  . umeclidinium-vilanterol (ANORO ELLIPTA) 62.5-25 MCG/INH AEPB Inhale 1 puff into the lungs daily.     Allergies:    Amlodipine besylate, Penicillins, and Valsartan   Social History: Social History   Socioeconomic History  . Marital status: Married    Spouse name: Not on file  . Number of children: Not on file  . Years of education: Not on file  . Highest education level: Not on file  Occupational History  . Not on file  Tobacco Use  . Smoking status: Former Smoker    Quit date: 05/24/2002    Years since quitting: 17.5  . Smokeless tobacco: Never Used  Substance and Sexual Activity  . Alcohol use: Not Currently  . Drug use: Never  . Sexual activity: Not Currently  Other Topics Concern  . Not on file  Social History Narrative  . Not on file   Social Determinants of Health   Financial Resource Strain:   . Difficulty of Paying Living Expenses:   Food Insecurity:   . Worried About Charity fundraiser in the Last Year:   . Arboriculturist in the Last Year:   Transportation Needs:   . Film/video editor (Medical):   Marland Kitchen Lack of Transportation (Non-Medical):   Physical Activity:   . Days of Exercise per Week:   . Minutes of Exercise per Session:   Stress:   . Feeling of Stress :   Social Connections:   . Frequency of Communication with Friends and Family:   . Frequency of Social Gatherings with Friends and Family:   . Attends Religious Services:   . Active Member of Clubs or Organizations:    . Attends Archivist Meetings:   Marland Kitchen Marital Status:      Family History: The patient's family history includes Heart disease in his mother.  ROS:   All other ROS reviewed and negative. Pertinent positives noted in the HPI.     EKGs/Labs/Other Studies Reviewed:   The following studies were personally reviewed by me today:  EKG:  EKG is ordered today.  The ekg ordered today demonstrates normal sinus rhythm, heart rate 74, no acute ST-T changes, and was personally reviewed by me.   TTE 12/05/2019 1. Left ventricular ejection fraction, by estimation, is 50 to 55%. The  left ventricle has low normal function. The left ventricle has no regional  wall motion abnormalities. There is mild left ventricular hypertrophy.  Left ventricular diastolic  parameters are consistent with Grade I diastolic dysfunction (impaired  relaxation). There is the interventricular septum is flattened in systole  and diastole, consistent with right ventricular pressure and volume  overload.  2. Right ventricular systolic function is moderately reduced. The right  ventricular size is mildly enlarged. There is severely elevated pulmonary  artery systolic pressure. The estimated right ventricular systolic  pressure is 97.4 mmHg.  3. The mitral valve is normal in structure. Mild mitral valve  regurgitation. No evidence of mitral stenosis.  4. The aortic valve is tricuspid. Aortic valve regurgitation is not  visualized. Mild aortic valve sclerosis is present, with no evidence of  aortic valve stenosis.  5. The inferior vena cava is normal in size with greater than 50%  respiratory variability, suggesting right atrial pressure of 3 mmHg.  6. Agitated saline contrast bubble study was negative, with no evidence  of any interatrial shunt.   Recent Labs: 10/29/2019: Hemoglobin 17.9; Platelets 240.0; Pro B Natriuretic peptide (BNP) 145.0   Recent Lipid Panel    Component Value Date/Time   CHOL 133  11/16/2008 0057   TRIG 120 11/16/2008 0057   HDL 41 11/16/2008 0057   CHOLHDL 3.2 Ratio 11/16/2008 0057   VLDL 24 11/16/2008 0057   LDLCALC 68 11/16/2008 0057    Physical Exam:   VS:  BP 112/84   Pulse 74   Ht _0  (1.753 m)   Wt 187 lb 9.6 oz (85.1 kg)   SpO2 93%   BMI 27.70 kg/m    Wt Readings from Last 3 Encounters:  12/10/19 187 lb 9.6 oz (85.1 kg)  11/22/19 190 lb (86.2 kg)  11/12/19 190 lb 9.6 oz (86.5 kg)    General: Well nourished, well developed, in no acute distress Heart: Atraumatic, normal size  Eyes: PEERLA, EOMI  Neck: Supple, no JVD Endocrine: No thryomegaly  Cardiac: Normal S1, S2; RRR; no murmurs, rubs, or gallops Lungs: Clear to auscultation bilaterally, no wheezing, rhonchi or rales  Abd: Soft, nontender, no hepatomegaly  Ext: No edema, pulses 2+ Musculoskeletal: No deformities, BUE and BLE strength normal and equal Skin: Warm and dry, no rashes   Neuro: Alert and oriented to person, place, time, and situation, CNII-XII grossly intact, no focal deficits  Psych: Normal mood and affect   ASSESSMENT:   Warren [name] is a 69 y.o. male who presents for the following: 1. Pulmonary hypertension, unspecified ([place])   2. Shortness of breath   3. Palpitations   4. Essential hypertension   5. Mixed hyperlipidemia     PLAN:   1. Pulmonary hypertension, unspecified ([place]) 2. Shortness of breath 3. Palpitations -Echo with moderate pulmonary hypertension.  RSVP around 68.  This is a change from 44.  He has a severe perfusion defect on pulmonary function testing.  No obstructive lung disease. -Bubble study negative on echo.  CT negative for interstitial lung disease. -ANA negative, anti-CCP negative, anti-RA negative -VQ scan negative CTEPH -Something clearly is going on.  Primary pulmonary hypertension?.  He has no stigmata of rheumatologic disease.  We will complete his work-up with ANCA's, anti-SCL 70, anti-double-stranded DNA, anticentromere, anti-Ro,  anti-SSA/SSB, anti-RNA polymerase 3. -I would also like for him to have a right and left heart catheterization with Dr. Glori Bickers.  He will have this done Friday morning at 9:00.  We will also ask him to do a vasodilator challenge. -I am in discussion with his pulmonologist about any other testing he may need.  I think we are in the early phase of what ever is going on.  Should this be primary pulmonary hypertension I suspect it likely be rheumatological related.  Hopefully will find an abnormal lab.  4. Essential hypertension -No change in medication.  5. Mixed hyperlipidemia -No change current medications  Disposition: Return in about 1 month (around 01/10/2020).  Medication Adjustments/Labs and Tests Ordered: Current medicines are reviewed at length with the patient today.  Concerns regarding medicines are outlined above.  Orders Placed This Encounter  Procedures  . ANA+ENA+DNA/DS+Scl 70+SjoSSA/B  . Anti-RNA Polymerase III (RDL)  . Centromere Antibodies  . ANCA TITERS  . HIV Antibody (routine testing w rflx)  . Basic metabolic panel  . CBC  . Sed Rate (ESR)  . C-reactive protein  . Sjogrens syndrome-A extractable nuclear antibody  . EKG 12-Lead   No orders of the defined types were placed in this encounter.   Patient Instructions  Medication Instructions:  Hold HCTZ day of Cath   *If you need a refill on your cardiac medications before your next appointment, please call your pharmacy*   Lab Work: CBC, BMET and other labs   If you have labs (blood work) drawn today and your tests are completely normal, you will receive your results only by: Marland Kitchen MyChart Message (if you have MyChart) OR . A paper copy in the mail If you have any lab test that is abnormal or we need to change your treatment, we will call you to review the results.   Testing/Procedures: Your physician has requested that you have a cardiac catheterization. Cardiac catheterization is used to diagnose  and/or treat various heart conditions. Doctors may recommend this procedure for a number of different reasons. The most common reason is to evaluate chest pain. Chest pain can be a symptom of coronary artery disease (CAD), and cardiac catheterization can show whether plaque  is narrowing or blocking your heart's arteries. This procedure is also used to evaluate the valves, as well as measure the blood flow and oxygen levels in different parts of your heart. For further information please visit HugeFiesta.[email or web address]. Please follow instruction sheet, as given.    Follow-Up: At [facility], you and your health needs are our priority.  As part of our continuing mission to provide you with exceptional heart care, we have created designated Provider Care Teams.  These Care Teams include your primary Cardiologist (physician) and Advanced Practice Providers (APPs -  Physician Assistants and Nurse Practitioners) who all work together to provide you with the care you need, when you need it.  We recommend signing up for the patient portal called "MyChart".  Sign up information is provided on this After Visit Summary.  MyChart is used to connect with patients for Virtual Visits (Telemedicine).  Patients are able to view lab/test results, encounter notes, upcoming appointments, etc.  Non-urgent messages can be sent to your provider as well.   To learn more about what you can do with MyChart, go to [email or web address].    Your next appointment:   1 month(s)  The format for your next appointment:   In Person  Provider:   Eleonore Chiquito, MD   Other Instructions     [place] [address] Dept: [phone] Loc: [place]  12/10/2019  You are scheduled for a Cardiac Catheterization on Friday, July 23 with Dr. Glori Bickers.  1. Please arrive at the [facility] (Main  Entrance A) at [facility]: [address] [place], [place] [address] at 7:00 AM (This time is two hours before your procedure to ensure your preparation). Free valet parking service is available.   Special note: Every effort is made to have your procedure done on time. Please understand that emergencies sometimes delay scheduled procedures.  2. Diet: Do not eat solid foods after midnight.  The patient may have clear liquids until 5am upon the day of the procedure.  3. Labs: You will need to have blood drawn today- CBC, BMET. You do not need to be fasting.  4. Medication instructions in preparation for your procedure:   Contrast Allergy: No  Stop taking, HTCZ (Hydrochlorothiazide) Friday, July 23,  On the morning of your procedure, take your Aspirin and any morning medicines NOT listed above.  You may use sips of water.  5. Plan for one night stay--bring personal belongings. 6. Bring a current list of your medications and current insurance cards. 7. You MUST have a responsible person to drive you home. 8. Someone MUST be with you the first 24 hours after you arrive home or your discharge will be delayed. 9. Please wear clothes that are easy to get on and off and wear slip-on shoes.  Thank you for allowing [place] to care for you!   -- [place] [facility]     Time Spent with Patient: I have spent a total of 35 minutes with patient reviewing hospital notes, telemetry, EKGs, labs and examining the patient as well as establishing an assessment and plan that was discussed with the patient.  > 50% of time was spent in direct patient care.  Signed, Addison Naegeli. [name], [place]  [address] [place], [place] [address] [phone]  12/10/2019 9:28 AM

## 2019-12-10 ENCOUNTER — Ambulatory Visit (INDEPENDENT_AMBULATORY_CARE_PROVIDER_SITE_OTHER): Payer: Medicare Other | Admitting: Cardiovascular Disease

## 2019-12-10 ENCOUNTER — Ambulatory Visit: Payer: Medicare Other | Admitting: Internal Medicine

## 2019-12-10 ENCOUNTER — Encounter: Payer: Self-pay | Admitting: Cardiovascular Disease

## 2019-12-10 ENCOUNTER — Other Ambulatory Visit: Payer: Self-pay

## 2019-12-10 VITALS — BP 112/84 | HR 74 | Ht 69.0 in | Wt 187.6 lb

## 2019-12-10 DIAGNOSIS — E782 Mixed hyperlipidemia: Secondary | ICD-10-CM | POA: Diagnosis not present

## 2019-12-10 DIAGNOSIS — R002 Palpitations: Secondary | ICD-10-CM | POA: Diagnosis not present

## 2019-12-10 DIAGNOSIS — I10 Essential (primary) hypertension: Secondary | ICD-10-CM | POA: Diagnosis not present

## 2019-12-10 DIAGNOSIS — I272 Pulmonary hypertension, unspecified: Secondary | ICD-10-CM

## 2019-12-10 DIAGNOSIS — R0602 Shortness of breath: Secondary | ICD-10-CM

## 2019-12-10 NOTE — Patient Instructions (Signed)
Medication Instructions:  Hold HCTZ day of Cath   *If you need a refill on your cardiac medications before your next appointment, please call your pharmacy*   Lab Work: CBC, BMET and other labs   If you have labs (blood work) drawn today and your tests are completely normal, you will receive your results only by: Marland Kitchen MyChart Message (if you have MyChart) OR . A paper copy in the mail If you have any lab test that is abnormal or we need to change your treatment, we will call you to review the results.   Testing/Procedures: Your physician has requested that you have a cardiac catheterization. Cardiac catheterization is used to diagnose and/or treat various heart conditions. Doctors may recommend this procedure for a number of different reasons. The most common reason is to evaluate chest pain. Chest pain can be a symptom of coronary artery disease (CAD), and cardiac catheterization can show whether plaque is narrowing or blocking your heart's arteries. This procedure is also used to evaluate the valves, as well as measure the blood flow and oxygen levels in different parts of your heart. For further information please visit HugeFiesta.[email or web address]. Please follow instruction sheet, as given.    Follow-Up: At [facility], you and your health needs are our priority.  As part of our continuing mission to provide you with exceptional heart care, we have created designated Provider Care Teams.  These Care Teams include your primary Cardiologist (physician) and Advanced Practice Providers (APPs -  Physician Assistants and Nurse Practitioners) who all work together to provide you with the care you need, when you need it.  We recommend signing up for the patient portal called "MyChart".  Sign up information is provided on this After Visit Summary.  MyChart is used to connect with patients for Virtual Visits (Telemedicine).  Patients are able to view lab/test results, encounter notes, upcoming appointments,  etc.  Non-urgent messages can be sent to your provider as well.   To learn more about what you can do with MyChart, go to [email or web address].    Your next appointment:   1 month(s)  The format for your next appointment:   In Person  Provider:   Eleonore Chiquito, MD   Other Instructions     [place] [address] Dept: [phone] Loc: [place]  12/10/2019  You are scheduled for a Cardiac Catheterization on Friday, July 23 with Dr. Glori Bickers.  1. Please arrive at the [facility] (Main Entrance A) at [facility]: [address] [place],  [address] at 7:00 AM (This time is two hours before your procedure to ensure your preparation). Free valet parking service is available.   Special note: Every effort is made to have your procedure done on time. Please understand that emergencies sometimes delay scheduled procedures.  2. Diet: Do not eat solid foods after midnight.  The patient may have clear liquids until 5am upon the day of the procedure.  3. Labs: You will need to have blood drawn today- CBC, BMET. You do not need to be fasting.  4. Medication instructions in preparation for your procedure:   Contrast Allergy: No  Stop taking, HTCZ (Hydrochlorothiazide) Friday, July 23,  On the morning of your procedure, take your Aspirin and any morning medicines NOT listed above.  You may use sips of water.  5. Plan for one night stay--bring personal belongings. 6. Bring a  current list of your medications and current insurance cards. 7. You MUST have a responsible person to drive you home. 8. Someone MUST be with you the first 24 hours after you arrive home or your discharge will be delayed. 9. Please wear clothes that are easy to get on and off and wear slip-on shoes.  Thank you for allowing [place] to care for you!   -- [place]  [facility]

## 2019-12-13 ENCOUNTER — Telehealth: Payer: Self-pay | Admitting: *Deleted

## 2019-12-13 NOTE — Telephone Encounter (Signed)
Pt contacted pre-catheterization scheduled at [facility] for: Friday December 14, 2019 9 AM Verified arrival time and place: [facility]) at: 7 AM   No solid food after midnight prior to cath, clear liquids until 5 AM day of procedure.  Hold: HCTZ- AM of procedure   Except hold medications AM meds can be  taken pre-cath with sips of water including: ASA 81 mg   Confirmed patient has responsible adult to drive home post procedure and observe 24 hours after arriving home: yes  You are allowed ONE visitor in the waiting room during your procedure. Both you and your visitor must wear a mask once you enter the hospital.      COVID-19 Pre-Screening Questions:  . In the past 14 days have you had a new cough associated with shortness of breath, fever (100.4 or greater) or sudden loss of taste or sense of smell? Shortness of breath -not new in 14 days . In the past 14 days have you been around anyone with known Covid 19? no . Any international travel in the past 14 days? no . Have you been vaccinated for COVID-19? Yes, see immunization history  Reviewed procedure/mask/visitor instructions, COVID-19 screening questions with patient.

## 2019-12-14 ENCOUNTER — Encounter (HOSPITAL_COMMUNITY): Payer: Self-pay | Admitting: Internal Medicine

## 2019-12-14 ENCOUNTER — Encounter (HOSPITAL_COMMUNITY)
Admission: RE | Disposition: A | Discharge status: Home / Self Care | Payer: Self-pay | Source: Home / Self Care | Attending: Internal Medicine

## 2019-12-14 ENCOUNTER — Other Ambulatory Visit: Payer: Self-pay

## 2019-12-14 ENCOUNTER — Telehealth (HOSPITAL_COMMUNITY): Payer: Self-pay | Admitting: Pharmacist

## 2019-12-14 ENCOUNTER — Ambulatory Visit (HOSPITAL_COMMUNITY)
Admission: RE | Admit: 2019-12-14 | Discharge: 2019-12-14 | Disposition: A | Discharge status: Home / Self Care | Payer: Medicare Other | Attending: Internal Medicine | Admitting: Internal Medicine

## 2019-12-14 DIAGNOSIS — I2721 Secondary pulmonary arterial hypertension: Secondary | ICD-10-CM | POA: Diagnosis not present

## 2019-12-14 DIAGNOSIS — Z8249 Family history of ischemic heart disease and other diseases of the circulatory system: Secondary | ICD-10-CM | POA: Diagnosis not present

## 2019-12-14 DIAGNOSIS — I2510 Atherosclerotic heart disease of native coronary artery without angina pectoris: Secondary | ICD-10-CM | POA: Diagnosis not present

## 2019-12-14 DIAGNOSIS — Z87891 Personal history of nicotine dependence: Secondary | ICD-10-CM | POA: Insufficient documentation

## 2019-12-14 DIAGNOSIS — Z880 Allergy status to penicillin: Secondary | ICD-10-CM | POA: Diagnosis not present

## 2019-12-14 DIAGNOSIS — E782 Mixed hyperlipidemia: Secondary | ICD-10-CM | POA: Diagnosis not present

## 2019-12-14 DIAGNOSIS — I10 Essential (primary) hypertension: Secondary | ICD-10-CM | POA: Diagnosis not present

## 2019-12-14 DIAGNOSIS — Z888 Allergy status to other drugs, medicaments and biological substances status: Secondary | ICD-10-CM | POA: Diagnosis not present

## 2019-12-14 DIAGNOSIS — Z7982 Long term (current) use of aspirin: Secondary | ICD-10-CM | POA: Insufficient documentation

## 2019-12-14 DIAGNOSIS — Z9981 Dependence on supplemental oxygen: Secondary | ICD-10-CM | POA: Diagnosis not present

## 2019-12-14 DIAGNOSIS — R002 Palpitations: Secondary | ICD-10-CM | POA: Diagnosis not present

## 2019-12-14 DIAGNOSIS — R0602 Shortness of breath: Secondary | ICD-10-CM | POA: Diagnosis not present

## 2019-12-14 DIAGNOSIS — Z79899 Other long term (current) drug therapy: Secondary | ICD-10-CM | POA: Diagnosis not present

## 2019-12-14 HISTORY — PX: RIGHT/LEFT HEART CATH AND CORONARY ANGIOGRAPHY: CATH118266

## 2019-12-14 LAB — POCT I-STAT EG7
Acid-Base Excess: 2 mmol/L (ref 0.0–2.0)
Acid-base deficit: 4 mmol/L — ABNORMAL HIGH (ref 0.0–2.0)
Bicarbonate: 20.8 mmol/L (ref 20.0–28.0)
Bicarbonate: 27 mmol/L (ref 20.0–28.0)
Calcium, Ion: 0.69 mmol/L — CL (ref 1.15–1.40)
Calcium, Ion: 1.17 mmol/L (ref 1.15–1.40)
HCT: 36 % — ABNORMAL LOW (ref 39.0–52.0)
HCT: 46 % (ref 39.0–52.0)
Hemoglobin: 12.2 g/dL — ABNORMAL LOW (ref 13.0–17.0)
Hemoglobin: 15.6 g/dL (ref 13.0–17.0)
O2 Saturation: 69 %
O2 Saturation: 71 %
Potassium: 2.2 mmol/L — CL (ref 3.5–5.1)
Potassium: 3.3 mmol/L — ABNORMAL LOW (ref 3.5–5.1)
Sodium: 142 mmol/L (ref 135–145)
Sodium: 148 mmol/L — ABNORMAL HIGH (ref 135–145)
TCO2: 22 mmol/L (ref 22–32)
TCO2: 28 mmol/L (ref 22–32)
pCO2, Ven: 35.7 mmHg — ABNORMAL LOW (ref 44.0–60.0)
pCO2, Ven: 42.9 mmHg — ABNORMAL LOW (ref 44.0–60.0)
pH, Ven: 7.373 (ref 7.250–7.430)
pH, Ven: 7.408 (ref 7.250–7.430)
pO2, Ven: 37 mmHg (ref 32.0–45.0)
pO2, Ven: 37 mmHg (ref 32.0–45.0)

## 2019-12-14 LAB — POCT I-STAT 7, (LYTES, BLD GAS, ICA,H+H)
Acid-Base Excess: 0 mmol/L (ref 0.0–2.0)
Bicarbonate: 24.5 mmol/L (ref 20.0–28.0)
Calcium, Ion: 1.09 mmol/L — ABNORMAL LOW (ref 1.15–1.40)
HCT: 44 % (ref 39.0–52.0)
Hemoglobin: 15 g/dL (ref 13.0–17.0)
O2 Saturation: 92 %
Potassium: 3.1 mmol/L — ABNORMAL LOW (ref 3.5–5.1)
Sodium: 144 mmol/L (ref 135–145)
TCO2: 26 mmol/L (ref 22–32)
pCO2 arterial: 37.7 mmHg (ref 32.0–48.0)
pH, Arterial: 7.421 (ref 7.350–7.450)
pO2, Arterial: 62 mmHg — ABNORMAL LOW (ref 83.0–108.0)

## 2019-12-14 SURGERY — RIGHT/LEFT HEART CATH AND CORONARY ANGIOGRAPHY
Anesthesia: LOCAL

## 2019-12-14 MED ORDER — FENTANYL CITRATE (PF) 100 MCG/2ML IJ SOLN
INTRAMUSCULAR | Status: AC
  Filled 2019-12-14: qty 2

## 2019-12-14 MED ORDER — VERAPAMIL HCL 2.5 MG/ML IV SOLN
INTRAVENOUS | Status: AC
  Filled 2019-12-14: qty 2

## 2019-12-14 MED ORDER — SODIUM CHLORIDE 0.9 % WEIGHT BASED INFUSION: 1.0000 mL/kg/h | INTRAVENOUS | Status: DC

## 2019-12-14 MED ORDER — LIDOCAINE HCL (PF) 1 % IJ SOLN
INTRAMUSCULAR | Status: DC | PRN
  Administered 2019-12-14 (×2): 2 mL

## 2019-12-14 MED ORDER — SODIUM CHLORIDE 0.9 % IV SOLN: 250.0000 mL | INTRAVENOUS | Status: DC | PRN

## 2019-12-14 MED ORDER — SODIUM CHLORIDE 0.9% FLUSH: 3.0000 mL | INTRAVENOUS | Status: DC | PRN

## 2019-12-14 MED ORDER — MIDAZOLAM HCL 2 MG/2ML IJ SOLN
INTRAMUSCULAR | Status: AC
  Filled 2019-12-14: qty 2

## 2019-12-14 MED ORDER — SODIUM CHLORIDE 0.9 % IV SOLN: INTRAVENOUS | Status: DC

## 2019-12-14 MED ORDER — HEPARIN (PORCINE) IN NACL 1000-0.9 UT/500ML-% IV SOLN
INTRAVENOUS | Status: AC
  Filled 2019-12-14: qty 1000

## 2019-12-14 MED ORDER — SODIUM CHLORIDE 0.9 % WEIGHT BASED INFUSION
3.0000 mL/kg/h | INTRAVENOUS | Status: DC
  Administered 2019-12-14: 3 mL/kg/h via INTRAVENOUS

## 2019-12-14 MED ORDER — VERAPAMIL HCL 2.5 MG/ML IV SOLN
INTRAVENOUS | Status: DC | PRN
  Administered 2019-12-14: 10 mL via INTRA_ARTERIAL

## 2019-12-14 MED ORDER — FENTANYL CITRATE (PF) 100 MCG/2ML IJ SOLN
INTRAMUSCULAR | Status: DC | PRN
  Administered 2019-12-14 (×2): 25 ug via INTRAVENOUS

## 2019-12-14 MED ORDER — IOHEXOL 350 MG/ML SOLN
INTRAVENOUS | Status: DC | PRN
  Administered 2019-12-14: 60 mL via INTRA_ARTERIAL

## 2019-12-14 MED ORDER — SODIUM CHLORIDE 0.9% FLUSH: 3.0000 mL | Freq: Two times a day (BID) | INTRAVENOUS | Status: DC

## 2019-12-14 MED ORDER — LIDOCAINE HCL (PF) 1 % IJ SOLN
INTRAMUSCULAR | Status: AC
  Filled 2019-12-14: qty 30

## 2019-12-14 MED ORDER — ONDANSETRON HCL 4 MG/2ML IJ SOLN: 4.0000 mg | Freq: Four times a day (QID) | INTRAMUSCULAR | Status: DC | PRN

## 2019-12-14 MED ORDER — HEPARIN SODIUM (PORCINE) 1000 UNIT/ML IJ SOLN
INTRAMUSCULAR | Status: DC | PRN
  Administered 2019-12-14: 4000 [IU] via INTRAVENOUS

## 2019-12-14 MED ORDER — HEPARIN (PORCINE) IN NACL 1000-0.9 UT/500ML-% IV SOLN
INTRAVENOUS | Status: DC | PRN
  Administered 2019-12-14 (×2): 500 mL

## 2019-12-14 MED ORDER — LABETALOL HCL 5 MG/ML IV SOLN: 10.0000 mg | INTRAVENOUS | Status: DC | PRN

## 2019-12-14 MED ORDER — HYDRALAZINE HCL 20 MG/ML IJ SOLN: 10.0000 mg | INTRAMUSCULAR | Status: DC | PRN

## 2019-12-14 MED ORDER — MIDAZOLAM HCL 2 MG/2ML IJ SOLN
INTRAMUSCULAR | Status: DC | PRN
  Administered 2019-12-14 (×2): 1 mg via INTRAVENOUS

## 2019-12-14 MED ORDER — ACETAMINOPHEN 325 MG PO TABS: 650.0000 mg | ORAL_TABLET | ORAL | Status: DC | PRN

## 2019-12-14 MED ORDER — ASPIRIN 81 MG PO CHEW: 81.0000 mg | CHEWABLE_TABLET | ORAL | Status: DC

## 2019-12-14 SURGICAL SUPPLY — 12 items
CATH 5FR JL3.5 JR4 ANG PIG MP (CATHETERS) ×1 IMPLANT
CATH SWAN GANZ 7F STRAIGHT (CATHETERS) ×1 IMPLANT
DEVICE RAD COMP TR BAND LRG (VASCULAR PRODUCTS) ×1 IMPLANT
GLIDESHEATH SLEND SS 6F .021 (SHEATH) ×1 IMPLANT
GLIDESHEATH SLENDER 7FR .021G (SHEATH) ×1 IMPLANT
GUIDEWIRE .025 260CM (WIRE) ×1 IMPLANT
GUIDEWIRE INQWIRE 1.5J.035X260 (WIRE) IMPLANT
INQWIRE 1.5J .035X260CM (WIRE) ×2
PACK CARDIAC CATHETERIZATION (CUSTOM PROCEDURE TRAY) ×2 IMPLANT
TRANSDUCER W/STOPCOCK (MISCELLANEOUS) ×3 IMPLANT
TUBING ART PRESS 72  MALE/FEM (TUBING) ×2
TUBING ART PRESS 72 MALE/FEM (TUBING) IMPLANT

## 2019-12-14 NOTE — Discharge Instructions (Signed)
Drink plenty of fluids for 48 hours and keep wrist elevated at heart level for 24 hours  Radial Site Care   This sheet gives you information about how to care for yourself after your procedure. Your health care provider may also give you more specific instructions. If you have problems or questions, contact your health care provider. What can I expect after the procedure? After the procedure, it is common to have:  Bruising and tenderness at the catheter insertion area. Follow these instructions at home: Medicines  Take over-the-counter and prescription medicines only as told by your health care provider. Insertion site care 1. Follow instructions from your health care provider about how to take care of your insertion site. Make sure you: ? Wash your hands with soap and water before you change your bandage (dressing). If soap and water are not available, use hand sanitizer. ? Remove your dressing as told by your health care provider. In 24 hours 2. Check your insertion site every day for signs of infection. Check for: ? Redness, swelling, or pain. ? Fluid or blood. ? Pus or a bad smell. ? Warmth. 3. Do not take baths, swim, or use a hot tub until your health care provider approves. 4. You may shower 24-48 hours after the procedure, or as directed by your health care provider. ? Remove the dressing and gently wash the site with plain soap and water. ? Pat the area dry with a clean towel. ? Do not rub the site. That could cause bleeding. 5. Do not apply powder or lotion to the site. Activity   1. For 24 hours after the procedure, or as directed by your health care provider: ? Do not flex or bend the affected arm. ? Do not push or pull heavy objects with the affected arm. ? Do not drive yourself home from the hospital or clinic. You may drive 24 hours after the procedure unless your health care provider tells you not to. ? Do not operate machinery or power tools. 2. Do not lift  anything that is heavier than 10 lb (4.5 kg), or the limit that you are told, until your health care provider says that it is safe.  For 4 days 3. Ask your health care provider when it is okay to: ? Return to work or school. ? Resume usual physical activities or sports. ? Resume sexual activity. General instructions  If the catheter site starts to bleed, raise your arm and put firm pressure on the site. If the bleeding does not stop, get help right away. This is a medical emergency.  If you went home on the same day as your procedure, a responsible adult should be with you for the first 24 hours after you arrive home.  Keep all follow-up visits as told by your health care provider. This is important. Contact a health care provider if:  You have a fever.  You have redness, swelling, or yellow drainage around your insertion site. Get help right away if:  You have unusual pain at the radial site.  The catheter insertion area swells very fast.  The insertion area is bleeding, and the bleeding does not stop when you hold steady pressure on the area.  Your arm or hand becomes pale, cool, tingly, or numb. These symptoms may represent a serious problem that is an emergency. Do not wait to see if the symptoms will go away. Get medical help right away. Call your local emergency services (911 in the [place] Do   not drive yourself to the hospital. Summary  After the procedure, it is common to have bruising and tenderness at the site.  Follow instructions from your health care provider about how to take care of your radial site wound. Check the wound every day for signs of infection.  Do not lift anything that is heavier than 10 lb (4.5 kg), or the limit that you are told, until your health care provider says that it is safe. This information is not intended to replace advice given to you by your health care provider. Make sure you discuss any questions you have with your health care  provider. Document Revised: 06/15/2017 Document Reviewed: 06/15/2017 Elsevier Patient Education  2020 Elsevier Inc.  

## 2019-12-14 NOTE — Telephone Encounter (Signed)
Advanced Heart Failure Patient Advocate Encounter  Prior Authorization for [employer] has been approved.    Effective dates: 05/25/2019 through 05/23/20  Audry Riles, PharmD, BCPS, BCCP, CPP Heart Failure Clinic Pharmacist [phone]

## 2019-12-14 NOTE — Research (Signed)
[place] Informed Consent   Subject Name: Warren [name]  Subject met inclusion and exclusion criteria.  The informed consent form, study requirements and expectations were reviewed with the subject and questions and concerns were addressed prior to the signing of the consent form.  The subject verbalized understanding of the trail requirements.  The subject agreed to participate in the [facility] trial and signed the informed consent.  The informed consent was obtained prior to performance of any protocol-specific procedures for the subject.  A copy of the signed informed consent was given to the subject and a copy was placed in the subject's medical record.  Philemon Kingdom D 12/14/2019, 0805 am

## 2019-12-14 NOTE — Interval H&P Note (Signed)
History and Physical Interval Note:  12/14/2019 10:09 AM  Warren [name]  has presented today for surgery, with the diagnosis of PAH  The various methods of treatment have been discussed with the patient and family. After consideration of risks, benefits and other options for treatment, the patient has consented to  Procedure(s): RIGHT/LEFT HEART CATH AND CORONARY ANGIOGRAPHY (N/A) and vasodilation challenge as a surgical intervention.  The patient's history has been reviewed, patient examined, no change in status, stable for surgery.  I have reviewed the patient's chart and labs.  Questions were answered to the patient's satisfaction.     Warren [name]

## 2019-12-14 NOTE — Telephone Encounter (Signed)
Patient Advocate Encounter   Received notification from [facility] [employer] that prior authorization for Opsumit is required.   PA submitted on CoverMyMeds Key [id number] Status is pending   Will continue to follow.  Audry Riles, PharmD, BCPS, BCCP, CPP Heart Failure Clinic Pharmacist [phone]

## 2019-12-16 LAB — BASIC METABOLIC PANEL
BUN/Creatinine Ratio: 14 (ref 10–24)
BUN: 14 mg/dL (ref 8–27)
CO2: 27 mmol/L (ref 20–29)
Calcium: 10.1 mg/dL (ref 8.6–10.2)
Chloride: 97 mmol/L (ref 96–106)
Creatinine, Ser: 1.01 mg/dL (ref 0.76–1.27)
GFR calc Af Amer: 88 mL/min/{1.73_m2} (ref >59)
GFR calc non Af Amer: 76 mL/min/{1.73_m2} (ref >59)
Glucose: 92 mg/dL (ref 65–99)
Potassium: 3.8 mmol/L (ref 3.5–5.2)
Sodium: 139 mmol/L (ref 134–144)

## 2019-12-16 LAB — ANA+ENA+DNA/DS+SCL 70+SJOSSA/B
ANA Titer 1: NEGATIVE
ENA RNP Ab: <0.2 AI (ref 0.0–0.9)
ENA SM Ab Ser-aCnc: <0.2 AI (ref 0.0–0.9)
ENA SSA (RO) Ab: 0.5 AI (ref 0.0–0.9)
ENA SSB (LA) Ab: <0.2 AI (ref 0.0–0.9)
Scleroderma (Scl-70) (ENA) Antibody, IgG: <0.2 AI (ref 0.0–0.9)
dsDNA Ab: <1 IU/mL (ref 0–9)

## 2019-12-16 LAB — CBC
Hematocrit: 52.4 % — ABNORMAL HIGH (ref 37.5–51.0)
Hemoglobin: 18.2 g/dL — ABNORMAL HIGH (ref 13.0–17.7)
MCH: 32.9 pg (ref 26.6–33.0)
MCHC: 34.7 g/dL (ref 31.5–35.7)
MCV: 95 fL (ref 79–97)
Platelets: 200 10*3/uL (ref 150–450)
RBC: 5.54 x10E6/uL (ref 4.14–5.80)
RDW: 13.4 % (ref 11.6–15.4)
WBC: 10.1 10*3/uL (ref 3.4–10.8)

## 2019-12-16 LAB — ANCA TITERS
Atypical pANCA: <1:20 {titer}
C-ANCA: <1:20 {titer}
P-ANCA: <1:20 {titer}

## 2019-12-16 LAB — ANTI-RNA POLYMERASE III (RDL): Anti-RNA Polymerase III (RDL): <20 Units (ref <20)

## 2019-12-16 LAB — C-REACTIVE PROTEIN: CRP: 3 mg/L (ref 0–10)

## 2019-12-16 LAB — HIV ANTIBODY (ROUTINE TESTING W REFLEX): HIV Screen 4th Generation wRfx: NONREACTIVE

## 2019-12-16 LAB — SEDIMENTATION RATE: Sed Rate: 24 mm/hr (ref 0–30)

## 2019-12-19 ENCOUNTER — Ambulatory Visit: Payer: Medicare Other | Admitting: Cardiovascular Disease

## 2019-12-19 NOTE — Telephone Encounter (Signed)
Sent in Patient enrollment application to [facility] for [place].    Application pending, will continue to follow.  Audry Riles, PharmD, BCPS, BCCP, CPP Heart Failure Clinic Pharmacist [phone]

## 2019-12-21 MED ORDER — MACITENTAN 10 MG PO TABS: 10.0000 mg | ORAL_TABLET | Freq: Every day | ORAL | 11 refills | Status: DC

## 2019-12-21 NOTE — Telephone Encounter (Signed)
Received message from patient that he had received his 30 day Opsumit supply through the voucher program. His enrollment application through [employer] is still pending.   Audry Riles, PharmD, BCPS, BCCP, CPP Heart Failure Clinic Pharmacist [phone]

## 2019-12-26 MED ORDER — MACITENTAN 10 MG PO TABS: 10.0000 mg | ORAL_TABLET | Freq: Every day | ORAL | 11 refills | Status: DC

## 2019-12-26 NOTE — Telephone Encounter (Signed)
Patient approved to receive Opsumit from [place].   Audry Riles, PharmD, BCPS, BCCP, CPP Heart Failure Clinic Pharmacist [phone]

## 2020-01-01 ENCOUNTER — Ambulatory Visit (HOSPITAL_COMMUNITY)
Admission: RE | Admit: 2020-01-01 | Discharge: 2020-01-01 | Disposition: A | Discharge status: Home / Self Care | Payer: Medicare Other | Source: Ambulatory Visit | Attending: Internal Medicine | Admitting: Internal Medicine

## 2020-01-01 ENCOUNTER — Encounter (HOSPITAL_COMMUNITY): Payer: Self-pay | Admitting: Internal Medicine

## 2020-01-01 ENCOUNTER — Other Ambulatory Visit: Payer: Self-pay

## 2020-01-01 VITALS — BP 122/68 | HR 78 | Wt 192.1 lb

## 2020-01-01 DIAGNOSIS — I272 Pulmonary hypertension, unspecified: Secondary | ICD-10-CM | POA: Diagnosis not present

## 2020-01-01 DIAGNOSIS — J449 Chronic obstructive pulmonary disease, unspecified: Secondary | ICD-10-CM | POA: Diagnosis not present

## 2020-01-01 DIAGNOSIS — J9611 Chronic respiratory failure with hypoxia: Secondary | ICD-10-CM | POA: Diagnosis not present

## 2020-01-01 DIAGNOSIS — E785 Hyperlipidemia, unspecified: Secondary | ICD-10-CM | POA: Insufficient documentation

## 2020-01-01 DIAGNOSIS — I2510 Atherosclerotic heart disease of native coronary artery without angina pectoris: Secondary | ICD-10-CM | POA: Diagnosis not present

## 2020-01-01 LAB — COMPREHENSIVE METABOLIC PANEL
ALT: 44 U/L (ref 0–44)
AST: 35 U/L (ref 15–41)
Albumin: 4.2 g/dL (ref 3.5–5.0)
Alkaline Phosphatase: 60 U/L (ref 38–126)
Anion gap: 12 (ref 5–15)
BUN: 12 mg/dL (ref 8–23)
CO2: 26 mmol/L (ref 22–32)
Calcium: 9.7 mg/dL (ref 8.9–10.3)
Chloride: 100 mmol/L (ref 98–111)
Creatinine, Ser: 0.76 mg/dL (ref 0.61–1.24)
GFR calc Af Amer: >60 mL/min (ref >60)
GFR calc non Af Amer: >60 mL/min (ref >60)
Glucose, Bld: 89 mg/dL (ref 70–99)
Potassium: 3.3 mmol/L — ABNORMAL LOW (ref 3.5–5.1)
Sodium: 138 mmol/L (ref 135–145)
Total Bilirubin: 1.1 mg/dL (ref 0.3–1.2)
Total Protein: 7.4 g/dL (ref 6.5–8.1)

## 2020-01-01 LAB — CBC
HCT: 46.5 % (ref 39.0–52.0)
Hemoglobin: 16.1 g/dL (ref 13.0–17.0)
MCH: 33.2 pg (ref 26.0–34.0)
MCHC: 34.6 g/dL (ref 30.0–36.0)
MCV: 95.9 fL (ref 80.0–100.0)
Platelets: 208 10*3/uL (ref 150–400)
RBC: 4.85 MIL/uL (ref 4.22–5.81)
RDW: 14 % (ref 11.5–15.5)
WBC: 10.2 10*3/uL (ref 4.0–10.5)
nRBC: 0 % (ref 0.0–0.2)

## 2020-01-01 LAB — BRAIN NATRIURETIC PEPTIDE: B Natriuretic Peptide: 39.1 pg/mL (ref 0.0–100.0)

## 2020-01-01 NOTE — Progress Notes (Signed)
9min completed  863ft=259.08 meters  Starting O2- 88% on 3L   Ending O2- 79% on 5L   Starting heart rate- 88 Ending heart rate- 110  Patient started walk on 3L of O2. O2 sats were 88%. Patient O2 dropped to 80% O2 increase to 4L.O2 dropped again to 78% patient placed on 5L. Patient recovered well at rest 96%on 5L. Per Debara Pickett

## 2020-01-01 NOTE — Progress Notes (Addendum)
[facility] F/U Visit   Primary Cardiologist: O.Nori Riis  HPI:  Warren [name] is a 69 y.o. male with a hx of HTN, HLD, pulmonary hypertension referred by Dr. [name] for further management of [place].   He has had a very complete w/u by Dr. [name]. He has had rather rapid onset of SOB associated with severe diffusion defect on PFTs with normal spirometry. CT negative for ILD. Echo showed normal EF with moderate to severe pHTN. VQ scan was negative.  His basic rheumatologic work-up was negative.    Underwent R/L cath 12/14/19 as below  Problem List 1. Moderate to severe pulmonary HTN -RVSP 44 -> 68 (2020->2021) -PFT 08/31/2019: no obstruction, severe diffusion defect (DCLO 25%) -CT negative for ILD -ANA negative, anti-CCP negative, anti-RA negative -VQ scan negative CTEPH 2. HLD -T chol 140, HDL 40, TG 82, LDL 78 3. HTN -A1c 5.9 4.CAD -Three-vessel calcification seen on recent lung CT -Nuclear medicine stress test normal September 2020  R/L cath 12/14/19  EF 55-60%   Prox RCA lesion is 20% stenosed.  Mid LAD lesion is 40% stenosed.   Findings:  Ao = 120/71 (95) LV = 117/8 RA = 5 RV = 67/6 PA = 68/28 (44) PCW = 6 Fick cardiac output/index = 4.9/2.4 PVR = 7.8 WU FA sat = 92% PA sat = 69%, 70%  Here fo post-cath f/u. Has been on Opsumit for 11 day. Occasional HAs. No edema. Has chronic runny nose which hasn't gotten worse. Wears O2 2L during the day. 93-97% at rest with activity 89-91% If does too much 82% Wears O2 at night. BP ok. Has not been tested for OSA. Still struggles to walk to mailbox.    ROS: All systems negative except as listed in HPI, PMH and Problem List.  SH:  Social History   Socioeconomic History  . Marital status: Married    Spouse name: Not on file  . Number of children: Not on file  . Years of education: Not on file  . Highest education level: Not on file  Occupational History  . Not on file  Tobacco Use  . Smoking status: Former Smoker     Quit date: 05/24/2002    Years since quitting: 17.6  . Smokeless tobacco: Never Used  Substance and Sexual Activity  . Alcohol use: Not Currently  . Drug use: Never  . Sexual activity: Not Currently  Other Topics Concern  . Not on file  Social History Narrative  . Not on file   Social Determinants of Health   Financial Resource Strain:   . Difficulty of Paying Living Expenses:   Food Insecurity:   . Worried About Charity fundraiser in the Last Year:   . Arboriculturist in the Last Year:   Transportation Needs:   . Film/video editor (Medical):   Marland Kitchen Lack of Transportation (Non-Medical):   Physical Activity:   . Days of Exercise per Week:   . Minutes of Exercise per Session:   Stress:   . Feeling of Stress :   Social Connections:   . Frequency of Communication with Friends and Family:   . Frequency of Social Gatherings with Friends and Family:   . Attends Religious Services:   . Active Member of Clubs or Organizations:   . Attends Archivist Meetings:   Marland Kitchen Marital Status:   Intimate Partner Violence:   . Fear of Current or Ex-Partner:   . Emotionally Abused:   Marland Kitchen Physically  Abused:   . Sexually Abused:     FH:  Family History  Problem Relation Age of Onset  . Heart disease Mother     Past Medical History:  Diagnosis Date  . Coronary artery disease   . Hyperlipidemia   . Hypertension     Current Outpatient Medications  Medication Sig Dispense Refill  . acetaminophen (TYLENOL) 500 MG tablet Take 1,000 mg by mouth every 6 (six) hours as needed for moderate pain or headache.    . allopurinol (ZYLOPRIM) 300 MG tablet Take 300 mg by mouth daily.    Marland Kitchen aspirin EC 81 MG tablet Take 1 tablet (81 mg total) by mouth daily. 90 tablet 3  . carvedilol (COREG) 12.5 MG tablet Take 12.5 mg by mouth 2 (two) times daily with a meal.     . ezetimibe (ZETIA) 10 MG tablet Take 10 mg by mouth daily.    . Flaxseed, Linseed, (FLAX SEEDS PO) Take 500 mg by mouth daily.     . hydrochlorothiazide (HYDRODIURIL) 25 MG tablet Take 25 mg by mouth daily.     . macitentan (OPSUMIT) 10 MG tablet Take 1 tablet (10 mg total) by mouth daily. 30 tablet 11  . Menthol-Methyl Salicylate (MUSCLE RUB) 10-15 % CREA Apply 1 application topically as needed for muscle pain.    . Multiple Vitamin (MULTIVITAMIN WITH MINERALS) TABS tablet Take 1 tablet by mouth daily.    . niacin 500 MG tablet Take 1,000 mg by mouth at bedtime.    . Omega-3 Fatty Acids (FISH OIL) 1000 MG CAPS Take 1,000 mg by mouth daily.    . OXYGEN Inhale 2-3 L into the lungs continuous. 2 L at rest and with activity 3 L    . triamcinolone (NASACORT ALLERGY 24HR) 55 MCG/ACT AERO nasal inhaler Place 1 spray into the nose daily.    Marland Kitchen umeclidinium-vilanterol (ANORO ELLIPTA) 62.5-25 MCG/INH AEPB Inhale 1 puff into the lungs daily. 90 each 0   No current facility-administered medications for this encounter.   Facility-Administered Medications Ordered in Other Encounters  Medication Dose Route Frequency Provider Last Rate Last Admin  . sodium chloride flush (NS) 0.9 % injection 10 mL  10 mL Intravenous PRN Geralynn Rile, MD   20 mL at 12/05/19 1125    Vitals:   01/01/20 1353  BP: 122/68  Pulse: 78  SpO2: 93%  Weight: 87.1 kg (192 lb 2 oz)    PHYSICAL EXAM:  General:  Sitting in WC. Wearing O2. No resp difficulty HEENT: normal Neck: supple. JVP flat. Carotids 2+ bilaterally; no bruits. No lymphadenopathy or thryomegaly appreciated. Cor: PMI normal. Regular rate & rhythm. Loud P2 2/6 TR Lungs: clear Abdomen: soft, nontender, nondistended. No hepatosplenomegaly. No bruits or masses. Good bowel sounds. Extremities: no cyanosis, clubbing, rash, edema Neuro: alert & orientedx3, cranial nerves grossly intact. Moves all 4 extremities w/o difficulty. Affect pleasant.   6MW completed  83ft=259.08 meters  Starting O2- 88% on 3L   Ending O2- 79% on 5L   Starting heart rate- 88 Ending heart rate-  110  Patient started walk on 3L of O2. O2 sats were 88%. Patient O2 dropped to 80% O2 increase to 4L.O2 dropped again to 78% patient placed on 5L. Patient recovered well at rest 96%on 5L. Per Debara Pickett   ASSESSMENT & PLAN:  1. Moderate to severe pulmonary HTN -PFT 08/31/2019: no obstruction/restriction, severe diffusion defect (DCLO 25%) - Echo 9/20 LVEF normal. RV mild to moderate HK  -CT negative for  ILD -ANA negative, anti-CCP negative, anti-RA negative -VQ scan negative CTEPH - WHO Group I - likely IPAH - NYHA III - 6MW 01/02/20 242m with sats down to 79% - On macitentan 10 - Refer to [facility] rehab - Will schedule televisit in 4 weeks with plan to add PDE-5 - Discussed goal of triple therapy  - Order home sleep study - Check labs with BNP   2. Chronic respiratory failure due to [facility] - continue home O2. Follow sats with pulse ok and titrate to keep sats >= 90%  3.CAD -Three-vessel calcification seen on recent lung CT - Minimal CAD by cath  - No s/s ischemia - Followed by Dr. [name]. Has been intolerant of statins. Can consider trial of low-dose Crestor M/W/F  Glori Bickers, MD  11:00 PM

## 2020-01-01 NOTE — Patient Instructions (Addendum)
Routine lab work today. Will notify you of abnormal results  You have been referred to Pulmonary Rehab at [facility]. (they will contact you to schedule an appointment)  Follow up in 4 weeks (televisit -you DO NOT need to come in office for this visit)  Your provider requests you have a home sleep study. (we will contact you to set up)

## 2020-01-02 ENCOUNTER — Ambulatory Visit: Payer: Medicare Other | Admitting: Cardiovascular Disease

## 2020-01-03 ENCOUNTER — Ambulatory Visit: Payer: Medicare Other | Admitting: Internal Medicine

## 2020-01-07 ENCOUNTER — Telehealth (HOSPITAL_COMMUNITY): Payer: Self-pay | Admitting: *Deleted

## 2020-01-07 DIAGNOSIS — I272 Pulmonary hypertension, unspecified: Secondary | ICD-10-CM

## 2020-01-07 MED ORDER — POTASSIUM CHLORIDE CRYS ER 20 MEQ PO TBCR
20.0000 meq | EXTENDED_RELEASE_TABLET | Freq: Every day | ORAL | 6 refills | Status: DC
Start: 2020-01-07 — End: 2020-03-31

## 2020-01-07 NOTE — Telephone Encounter (Signed)
Pt aware, agreeable, and verbalizes understanding, rx sent in, repeat lab sch for 8/24

## 2020-01-07 NOTE — Telephone Encounter (Signed)
-----   Message from Jolaine Artist, MD sent at 01/06/2020  9:59 PM EDT ----- Add kdur 20 daily. Take 68meq the first day only. Repeat BMET 1 week

## 2020-01-09 DIAGNOSIS — H5203 Hypermetropia, bilateral: Secondary | ICD-10-CM | POA: Diagnosis not present

## 2020-01-09 DIAGNOSIS — H2513 Age-related nuclear cataract, bilateral: Secondary | ICD-10-CM | POA: Diagnosis not present

## 2020-01-15 ENCOUNTER — Other Ambulatory Visit: Payer: Self-pay

## 2020-01-15 ENCOUNTER — Ambulatory Visit (HOSPITAL_COMMUNITY)
Admission: RE | Admit: 2020-01-15 | Discharge: 2020-01-15 | Disposition: A | Discharge status: Home / Self Care | Payer: Medicare Other | Source: Ambulatory Visit | Attending: Internal Medicine | Admitting: Internal Medicine

## 2020-01-15 DIAGNOSIS — I272 Pulmonary hypertension, unspecified: Secondary | ICD-10-CM | POA: Diagnosis not present

## 2020-01-15 LAB — BASIC METABOLIC PANEL
Anion gap: 12 (ref 5–15)
BUN: 14 mg/dL (ref 8–23)
CO2: 23 mmol/L (ref 22–32)
Calcium: 9.5 mg/dL (ref 8.9–10.3)
Chloride: 105 mmol/L (ref 98–111)
Creatinine, Ser: 0.98 mg/dL (ref 0.61–1.24)
GFR calc Af Amer: >60 mL/min (ref >60)
GFR calc non Af Amer: >60 mL/min (ref >60)
Glucose, Bld: 87 mg/dL (ref 70–99)
Potassium: 3.8 mmol/L (ref 3.5–5.1)
Sodium: 140 mmol/L (ref 135–145)

## 2020-01-30 ENCOUNTER — Encounter (HOSPITAL_COMMUNITY): Payer: Self-pay

## 2020-01-30 ENCOUNTER — Ambulatory Visit (HOSPITAL_COMMUNITY)
Admission: RE | Admit: 2020-01-30 | Discharge: 2020-01-30 | Disposition: A | Discharge status: Home / Self Care | Payer: Medicare Other | Source: Ambulatory Visit | Attending: Internal Medicine | Admitting: Internal Medicine

## 2020-01-30 ENCOUNTER — Other Ambulatory Visit: Payer: Self-pay

## 2020-01-30 DIAGNOSIS — R0683 Snoring: Secondary | ICD-10-CM

## 2020-01-30 DIAGNOSIS — I272 Pulmonary hypertension, unspecified: Secondary | ICD-10-CM | POA: Diagnosis not present

## 2020-01-30 DIAGNOSIS — I2510 Atherosclerotic heart disease of native coronary artery without angina pectoris: Secondary | ICD-10-CM

## 2020-01-30 DIAGNOSIS — J9611 Chronic respiratory failure with hypoxia: Secondary | ICD-10-CM

## 2020-01-30 MED ORDER — SILDENAFIL CITRATE 20 MG PO TABS
20.0000 mg | ORAL_TABLET | Freq: Three times a day (TID) | ORAL | 0 refills | Status: DC
Start: 2020-01-30 — End: 2020-01-31

## 2020-01-30 NOTE — Progress Notes (Signed)
Heart Failure TeleHealth Note  Due to national recommendations of social distancing due to [place] 19, Audio/video telehealth visit is felt to be most appropriate for this patient at this time.  See MyChart message from today for patient consent regarding telehealth for [facility]. The patient was identified personally using two identifiers.   Date:  01/30/2020   ID:  Warren [name], DOB March 16, 1951, MRN [id number]  Location: Home  Provider location: [facility] Type of Visit: Established patient  PCP:  [name], MD  Cardiologist:  [name], MD Primary HF: Debra Colon  Chief Complaint: Heart Failure follow-up   History of Present Illness:  Warren [name] a 69 y.o.malewith a hx of HTN, HLD, pulmonary hypertension referred by Dr. [name] for further management of [place].   He has had a very complete w/u by Dr. [name]. He has had rather rapid onset of SOB associated with severe diffusion defect on PFTs with normal spirometry. CT negative for ILD. Echo showed normal EF with moderate to severe pHTN. VQ scan was negative. His basic rheumatologic work-up was negative.   Underwent R/L cath 12/14/19 as below and started on Opsumit.   He presents via Engineer, civil (consulting) for a telehealth visit today.  Says he is feeling ok. Today is day #40 on Opsumit. Tolerating well except for mild rhinosinusitis. Perhaps breathing may be a little benefit but sats not improved. Sats 87-92%. No edema. No dizziness or orthostasis.   [place] therapy 1. Macitentan 10mg   Started 8/21  Problem List/Studies 1.Moderate to severe pulmonary HTN -RVSP 44 -> 68 (2020->2021) -PFT 08/31/2019: no obstruction, severe diffusion defect (DCLO 25%) -CT negative for ILD -ANA negative, anti-CCP negative, anti-RA negative -VQ scan negative CTEPH 2.HLD -T chol 140, HDL 40, TG 82, LDL 78 3. HTN -A1c 5.9 4.CAD -Three-vessel calcification seen on recent lung CT -Nuclear  medicine stress test normal September 2020  R/L cath 12/14/19  EF 55-60%   Prox RCA lesion is 20% stenosed.  Mid LAD lesion is 40% stenosed.  Findings:  Ao = 120/71 (95) LV = 117/8 RA = 5 RV = 67/6 PA = 68/28 (44) PCW = 6 Fick cardiac output/index = 4.9/2.4 PVR = 7.8 WU FA sat = 92% PA sat = 69%, 70%   SHAVON ZENZ denies symptoms worrisome for COVID 19.   Past Medical History:  Diagnosis Date  . Coronary artery disease   . Hyperlipidemia   . Hypertension    Past Surgical History:  Procedure Laterality Date  . CARDIAC CATHETERIZATION    . RIGHT/LEFT HEART CATH AND CORONARY ANGIOGRAPHY N/A 12/14/2019   Procedure: RIGHT/LEFT HEART CATH AND CORONARY ANGIOGRAPHY;  Surgeon: Jolaine Artist, MD;  Location: [facility];  Service: Cardiovascular;  Laterality: N/A;     Current Outpatient Medications  Medication Sig Dispense Refill  . acetaminophen (TYLENOL) 500 MG tablet Take 1,000 mg by mouth every 6 (six) hours as needed for moderate pain or headache.    . allopurinol (ZYLOPRIM) 300 MG tablet Take 300 mg by mouth daily.    Marland Kitchen aspirin EC 81 MG tablet Take 1 tablet (81 mg total) by mouth daily. 90 tablet 3  . carvedilol (COREG) 12.5 MG tablet Take 12.5 mg by mouth 2 (two) times daily with a meal.     . ezetimibe (ZETIA) 10 MG tablet Take 10 mg by mouth daily.    . Flaxseed, Linseed, (FLAX SEEDS PO) Take 500 mg by mouth daily.    Marland Kitchen  hydrochlorothiazide (HYDRODIURIL) 25 MG tablet Take 25 mg by mouth daily.     . macitentan (OPSUMIT) 10 MG tablet Take 1 tablet (10 mg total) by mouth daily. 30 tablet 11  . Menthol-Methyl Salicylate (MUSCLE RUB) 10-15 % CREA Apply 1 application topically as needed for muscle pain.    . Multiple Vitamin (MULTIVITAMIN WITH MINERALS) TABS tablet Take 1 tablet by mouth daily.    . niacin 500 MG tablet Take 1,000 mg by mouth at bedtime.    . Omega-3 Fatty Acids (FISH OIL) 1000 MG CAPS Take 1,000 mg by mouth daily.    . OXYGEN Inhale  2-3 L into the lungs continuous. 2 L at rest and with activity 3 L    . potassium chloride SA (KLOR-CON) 20 MEQ tablet Take 1 tablet (20 mEq total) by mouth daily. 30 tablet 6  . triamcinolone (NASACORT ALLERGY 24HR) 55 MCG/ACT AERO nasal inhaler Place 1 spray into the nose daily.    Marland Kitchen umeclidinium-vilanterol (ANORO ELLIPTA) 62.5-25 MCG/INH AEPB Inhale 1 puff into the lungs daily. 90 each 0   No current facility-administered medications for this encounter.   Facility-Administered Medications Ordered in Other Encounters  Medication Dose Route Frequency Provider Last Rate Last Admin  . sodium chloride flush (NS) 0.9 % injection 10 mL  10 mL Intravenous PRN [name], Cassie Freer, MD   20 mL at 12/05/19 1125    Allergies:   Amlodipine besylate, Penicillins, and Valsartan   Social History:  The patient  reports that he quit smoking about 17 years ago. He has never used smokeless tobacco. He reports previous alcohol use. He reports that he does not use drugs.   Family History:  The patient's family history includes Heart disease in his mother.   ROS:  Please see the history of present illness.   All other systems are personally reviewed and negative.   Exam:  (Video/Tele Health Call; Exam is subjective and or/visual.) General:  Speaks in full sentences. No resp difficulty. Lungs: Normal respiratory effort with conversation.  Abdomen: Non-distended per patient report Extremities: Pt denies edema. Neuro: Alert & oriented x 3.   Recent Labs: 10/29/2019: Pro B Natriuretic peptide (BNP) 145.0 01/01/2020: ALT 44; B Natriuretic Peptide 39.1; Hemoglobin 16.1; Platelets 208 01/15/2020: BUN 14; Creatinine, Ser 0.98; Potassium 3.8; Sodium 140  Personally reviewed   Wt Readings from Last 3 Encounters:  01/01/20 87.1 kg (192 lb 2 oz)  12/14/19 85.3 kg (188 lb)  12/10/19 85.1 kg (187 lb 9.6 oz)      ASSESSMENT AND PLAN:  1.Moderate to severe pulmonary HTN -PFT 08/31/2019: no  obstruction/restriction, severe diffusion defect (DCLO 25%) - Echo 9/20 LVEF normal. 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 Chronic respiratory failure due to [facility] - continue home O2. Follow sats with pulse ok and titrate to keep sats >= 90%  3.CAD -Three-vessel calcification seen on recent lung CT - Minimal CAD by cath  - No s/s of ischemia - Followed by Dr. [name]. Has been intolerant of statins. Can consider trial of low-dose Crestor  M/W/F  COVID screen The patient does not have any symptoms that suggest any further testing/ screening at this time.  Social distancing reinforced today.  Recommended follow-up:  As above  Relevant cardiac medications were reviewed at length with the patient today.   The patient does not have concerns regarding their medications at this time.   The following changes were made today:  As above  Today, I have spent 12 minutes with the patient with telehealth technology discussing the above issues .    Signed, Glori Bickers, MD  01/30/2020 2:25 PM  Advanced Heart Failure [facility] [address] Heart and [place] [address] [phone] (office) [phone] (fax)

## 2020-01-30 NOTE — Addendum Note (Signed)
Encounter addended by: Scarlette Calico, RN on: 01/30/2020 5:04 PM  Actions taken: Order list changed

## 2020-01-30 NOTE — Addendum Note (Signed)
Encounter addended by: Scarlette Calico, RN on: 01/30/2020 5:02 PM  Actions taken: Order list changed, Diagnosis association updated

## 2020-01-30 NOTE — Patient Instructions (Addendum)
START Sildenafil 20mg  Three times a day  Your physician recommends that you come to the office for labs in 1-2 weeks  Your physician recommends that you schedule your follow up appointment to return to clinic with an echocardiogram in 3 months  Your physician has requested that you have an echocardiogram. Echocardiography is a painless test that uses sound waves to create images of your heart. It provides your doctor with information about the size and shape of your heart and how well your heart's chambers and valves are working. This procedure takes approximately one hour. There are no restrictions for this procedure.  A referral for a sleep study was ordered with [facility]. They will contact you to schedule an appointment   If you have any questions or concerns before your next appointment please send [place] a message through [place] or call our office at [phone].    TO LEAVE A MESSAGE FOR THE NURSE SELECT OPTION 2, PLEASE LEAVE A MESSAGE INCLUDING: . YOUR NAME . DATE OF BIRTH . CALL BACK NUMBER . REASON FOR CALL**this is important as we prioritize the call backs  [place] AS LONG AS YOU CALL BEFORE 4:00 PM  At the [facility], you and your health needs are our priority. As part of our continuing mission to provide you with exceptional heart care, we have created designated Provider Care Teams. These Care Teams include your primary Cardiologist (physician) and Advanced Practice Providers (APPs- Physician Assistants and Nurse Practitioners) who all work together to provide you with the care you need, when you need it.   You may see any of the following providers on your designated Care Team at your next follow up: Marland Kitchen Dr Glori Bickers . Dr Loralie Champagne . Darrick Grinder, NP . Lyda Jester, PA . Audry Riles, PharmD   Please be sure to bring in all your medications bottles to every appointment.

## 2020-01-30 NOTE — Addendum Note (Signed)
Encounter addended by: Malena Edman, RN on: 01/30/2020 4:38 PM  Actions taken: Pharmacy for encounter modified, Visit diagnoses modified, Order list changed, Diagnosis association updated, Clinical Note Signed

## 2020-01-31 ENCOUNTER — Other Ambulatory Visit (HOSPITAL_COMMUNITY): Payer: Self-pay

## 2020-01-31 MED ORDER — SILDENAFIL CITRATE 20 MG PO TABS
20.0000 mg | ORAL_TABLET | Freq: Three times a day (TID) | ORAL | 5 refills | Status: DC
Start: 2020-01-31 — End: 2020-02-06

## 2020-02-03 NOTE — Progress Notes (Signed)
Cardiology Office Note:   Date:  02/06/2020  NAME:  LOU LOEWE    MRN: [id number] DOB:  May 13, 1951   PCP:  [name], MD  Cardiologist:  [name], MD   Referring MD: [name], MD   Chief Complaint  Patient presents with  . pulmonary hypertension   History of Present Illness:   DEMOND SHALLENBERGER is a 69 y.o. male with a hx of moderate to severe pulmonary hypertension (idiopathic), HLD, HTN, non-obstructive CAD who presents for follow-up. Work-up has revealed rapid onset pulmonary hypertension. Work-up shows this is idiopathic. On macitentan/sildenafil.   He reports he started sildenafil last week.  Apparently he had back cramps and trouble breathing.  He stopped this medication.  His symptoms have resolved without this.  He reached out to Dr. Clayborne Dana office about this possibly being a side effect.  I have also reached out to them.  He reports he is tolerating the macitentan well.  He is able to walk a bit more without getting short of breath.  O2 levels still quite low per his report.  He requires oxygen 24/7.  We did discuss going back on Crestor.  He is amenable to doing this.  He would like to start low.  5 mg daily is recommended.  His blood pressure also is a bit low.  I recommended to come off the carvedilol.  He is okay to do this.  He denies any chest pain.  He does inquire about his risk of coronary artery disease.  He does have coronary artery disease.  I recommended intensification of statin therapy and lowering his LDL cholesterol.  He is okay to go back on a statin.  [facility]  He reports for now he would like to continue with Dr. Ronna Polio and then possibly pursue this at a later date.  Hopefully we can get him on appropriate therapy and repeat heart pressures will be improved.1  Problem List 1. Idiopathic pulmonary hypertension  -RVSP 44 -> 68 (2020->2021) -PFT 08/31/2019: no obstruction, severe  diffusion defect (DCLO 25%) -CT negative for ILD -ANA negative, anti-CCP negative, anti-RA negative -VQ scan negative CTEPH -RHC 12/14/2019: mPAP 44, PVR 7.8 WU 2. HLD -T chol 140, HDL 40, TG 82, LDL 78 3. HTN -A1c 5.9 4.CAD -Three-vessel calcification seen on recent lung CT -Nuclear medicine stress test normal September 2020 -LHC 12/14/2019: 20% RCA 40% LAD  Past Medical History: Past Medical History:  Diagnosis Date  . Coronary artery disease   . Hyperlipidemia   . Hypertension     Past Surgical History: Past Surgical History:  Procedure Laterality Date  . CARDIAC CATHETERIZATION    . RIGHT/LEFT HEART CATH AND CORONARY ANGIOGRAPHY N/A 12/14/2019   Procedure: RIGHT/LEFT HEART CATH AND CORONARY ANGIOGRAPHY;  Surgeon: Jolaine Artist, MD;  Location: [facility];  Service: Cardiovascular;  Laterality: N/A;    Current Medications: Current Meds  Medication Sig  . acetaminophen (TYLENOL) 500 MG tablet Take 1,000 mg by mouth every 6 (six) hours as needed for moderate pain or headache.  . allopurinol (ZYLOPRIM) 300 MG tablet Take 300 mg by mouth daily.  Marland Kitchen aspirin EC 81 MG tablet Take 1 tablet (81 mg total) by mouth daily.  Marland Kitchen ezetimibe (ZETIA) 10 MG tablet Take 10 mg by mouth daily.  . Flaxseed, Linseed, (FLAX SEEDS PO) Take 500 mg by mouth daily.  . hydrochlorothiazide (HYDRODIURIL) 25 MG tablet Take  25 mg by mouth daily.   . macitentan (OPSUMIT) 10 MG tablet Take 1 tablet (10 mg total) by mouth daily.  . Menthol-Methyl Salicylate (MUSCLE RUB) 10-15 % CREA Apply 1 application topically as needed for muscle pain.  . Omega-3 Fatty Acids (FISH OIL) 1000 MG CAPS Take 1,000 mg by mouth daily.  . OXYGEN Inhale 2-3 L into the lungs continuous. 2 L at rest and with activity 3 L  . oxymetazoline (AFRIN NASAL SPRAY) 0.05 % nasal spray Place 2 sprays into both nostrils 2 (two) times daily as needed for congestion.  . potassium chloride SA (KLOR-CON) 20 MEQ tablet Take 1 tablet (20  mEq total) by mouth daily.  . [DISCONTINUED] carvedilol (COREG) 12.5 MG tablet Take 12.5 mg by mouth 2 (two) times daily with a meal.      Allergies:    Amlodipine besylate, Penicillins, and Valsartan   Social History: Social History   Socioeconomic History  . Marital status: Married    Spouse name: Not on file  . Number of children: Not on file  . Years of education: Not on file  . Highest education level: Not on file  Occupational History  . Not on file  Tobacco Use  . Smoking status: Former Smoker    Quit date: 05/24/2002    Years since quitting: 17.7  . Smokeless tobacco: Never Used  Substance and Sexual Activity  . Alcohol use: Not Currently  . Drug use: Never  . Sexual activity: Not Currently  Other Topics Concern  . Not on file  Social History Narrative  . Not on file   Social Determinants of Health   Financial Resource Strain:   . Difficulty of Paying Living Expenses: Not on file  Food Insecurity:   . Worried About Charity fundraiser in the Last Year: Not on file  . Ran Out of Food in the Last Year: Not on file  Transportation Needs:   . Lack of Transportation (Medical): Not on file  . Lack of Transportation (Non-Medical): Not on file  Physical Activity:   . Days of Exercise per Week: Not on file  . Minutes of Exercise per Session: Not on file  Stress:   . Feeling of Stress : Not on file  Social Connections:   . Frequency of Communication with Friends and Family: Not on file  . Frequency of Social Gatherings with Friends and Family: Not on file  . Attends Religious Services: Not on file  . Active Member of Clubs or Organizations: Not on file  . Attends Archivist Meetings: Not on file  . Marital Status: Not on file     Family History: The patient's family history includes Heart disease in his mother.  ROS:   All other ROS reviewed and negative. Pertinent positives noted in the HPI.     EKGs/Labs/Other Studies Reviewed:   The following  studies were personally reviewed by me today:  LHC/RHC 12/14/2019  Prox RCA lesion is 20% stenosed.  Mid LAD lesion is 40% stenosed.   Findings:  Ao = 120/71 (95) LV = 117/8 RA = 5 RV = 67/6 PA = 68/28 (44) PCW = 6 Fick cardiac output/index = 4.9/2.4 PVR = 7.8 WU FA sat = 92% PA sat = 69%, 70%  Initial PCW pressure seemed to be 34 with v waves to 41 with what seemed to be adequate wedge position. However with normal LVEDP we attempted to get a deeper wedge which was 6. Simultaneous LV-PCWP showed  no MV gradient.    Recent Labs: 10/29/2019: Pro B Natriuretic peptide (BNP) 145.0 01/01/2020: ALT 44; B Natriuretic Peptide 39.1; Hemoglobin 16.1; Platelets 208 01/15/2020: BUN 14; Creatinine, Ser 0.98; Potassium 3.8; Sodium 140   Recent Lipid Panel    Component Value Date/Time   CHOL 133 11/16/2008 0057   TRIG 120 11/16/2008 0057   HDL 41 11/16/2008 0057   CHOLHDL 3.2 Ratio 11/16/2008 0057   VLDL 24 11/16/2008 0057   LDLCALC 68 11/16/2008 0057    Physical Exam:   VS:  BP 118/70   Pulse 77   Ht 5\' 9"  (1.753 m)   Wt 193 lb (87.5 kg)   BMI 28.50 kg/m    Wt Readings from Last 3 Encounters:  02/06/20 193 lb (87.5 kg)  01/01/20 192 lb 2 oz (87.1 kg)  12/14/19 188 lb (85.3 kg)    General: Well nourished, well developed, in no acute distress Heart: Atraumatic, normal size  Eyes: PEERLA, EOMI  Neck: Supple, no JVD Endocrine: No thryomegaly Cardiac: Normal S1, S2; RRR; no murmurs, rubs, or gallops Lungs: Clear to auscultation bilaterally, no wheezing, rhonchi or rales  Abd: Soft, nontender, no hepatomegaly  Ext: No edema, pulses 2+ Musculoskeletal: No deformities, BUE and BLE strength normal and equal Skin: Warm and dry, no rashes   Neuro: Alert and oriented to person, place, time, and situation, CNII-XII grossly intact, no focal deficits  Psych: Normal mood and affect   ASSESSMENT:   VADA YELLEN is a 69 y.o. male who presents for the following: 1. Pulmonary  hypertension, unspecified ([place])   2. Coronary artery disease involving native coronary artery of native heart without angina pectoris   3. Mixed hyperlipidemia    PLAN:   1. Pulmonary hypertension, unspecified ([place]) -idiopathic per work-up.  Negative rheumatologic work-up.  Negative VQ scan.  No evidence of interstitial lung disease. -Severe diffusion capacity defect present. -He was started on macitentan and sildenafil.  He had issues with sildenafil.  I reached out to Dr. Pierre Bali about his sildenafil.  Hopefully we can get a recommendation about this medication. -He will continue with oxygen for now. -He is euvolemic on examination.  Plan to repeat echo per advanced heart failure.  2. Coronary artery disease involving native coronary artery of native heart without angina pectoris -40% LAD lesion.  20% RCA lesion.  Nonobstructive.  No symptoms of angina.  We will add Crestor 5 mg daily.  He will continue Zetia.  Goal LDL cholesterol less than 70.  His blood pressure is well controlled.  I will stop his Coreg.  No strong indication for this medication.  3. Mixed hyperlipidemia -Add Crestor.  Continue Zetia.  I am sure that his LDL will be at goal with the addition of 5 mg of Crestor.  Disposition: Return in about 6 months (around 08/05/2020).  Medication Adjustments/Labs and Tests Ordered: Current medicines are reviewed at length with the patient today.  Concerns regarding medicines are outlined above.  No orders of the defined types were placed in this encounter.  Meds ordered this encounter  Medications  . rosuvastatin (CRESTOR) 5 MG tablet    Sig: Take 1 tablet (5 mg total) by mouth daily.    Dispense:  30 tablet    Refill:  3    Patient Instructions  Medication Instructions:  Stop Carvedilol  Start Crestor 5 mg daily   *If you need a refill on your cardiac medications before your next appointment, please call your pharmacy*  Follow-Up: At [facility], you and  your health needs are our priority.  As part of our continuing mission to provide you with exceptional heart care, we have created designated Provider Care Teams.  These Care Teams include your primary Cardiologist (physician) and Advanced Practice Providers (APPs -  Physician Assistants and Nurse Practitioners) who all work together to provide you with the care you need, when you need it.  We recommend signing up for the patient portal called "MyChart".  Sign up information is provided on this After Visit Summary.  MyChart is used to connect with patients for Virtual Visits (Telemedicine).  Patients are able to view lab/test results, encounter notes, upcoming appointments, etc.  Non-urgent messages can be sent to your provider as well.   To learn more about what you can do with MyChart, go to [email or web address].    Your next appointment:   6 month(s)  The format for your next appointment:   In Person  Provider:   Eleonore Chiquito, MD        Time Spent with Patient: I have spent a total of 35 minutes with patient reviewing hospital notes, telemetry, EKGs, labs and examining the patient as well as establishing an assessment and plan that was discussed with the patient.  > 50% of time was spent in direct patient care.  Signed, Addison Naegeli. [name], [place]  [address], [place], [place] [address] [phone]  02/06/2020 11:30 AM

## 2020-02-05 ENCOUNTER — Telehealth (HOSPITAL_COMMUNITY): Payer: Self-pay | Admitting: Cardiology

## 2020-02-05 NOTE — Telephone Encounter (Signed)
LATE ENTRY: Spoke with patient while on triage 02/04/20 Patient reports since starting sildenafil he has noticed increase in nasal congestion not relieved with nasocort and severe muscle cramps.  Advised would forward to pharmacist for side effects and if needed provider for further medication recommendations

## 2020-02-06 ENCOUNTER — Ambulatory Visit (INDEPENDENT_AMBULATORY_CARE_PROVIDER_SITE_OTHER): Payer: Medicare Other | Admitting: Cardiovascular Disease

## 2020-02-06 ENCOUNTER — Other Ambulatory Visit: Payer: Self-pay

## 2020-02-06 ENCOUNTER — Encounter: Payer: Self-pay | Admitting: Cardiovascular Disease

## 2020-02-06 VITALS — BP 118/70 | HR 77 | Ht 69.0 in | Wt 193.0 lb

## 2020-02-06 DIAGNOSIS — I272 Pulmonary hypertension, unspecified: Secondary | ICD-10-CM

## 2020-02-06 DIAGNOSIS — I2510 Atherosclerotic heart disease of native coronary artery without angina pectoris: Secondary | ICD-10-CM | POA: Diagnosis not present

## 2020-02-06 DIAGNOSIS — E782 Mixed hyperlipidemia: Secondary | ICD-10-CM

## 2020-02-06 MED ORDER — ROSUVASTATIN CALCIUM 5 MG PO TABS: 5.0000 mg | ORAL_TABLET | Freq: Every day | ORAL | 3 refills | Status: DC

## 2020-02-06 NOTE — Telephone Encounter (Signed)
  I typically associate rhinosinusitis more with macitnentan than sildenafil but I agree it seems like he was tolerating macitentan and sildenafil put him over the top.  Agree with stopping sildenafil and see what happens. If improves off sildenafil would try selxipag next (versus adempas)   thanks

## 2020-02-06 NOTE — Telephone Encounter (Signed)
Pt aware and voiced understanding 

## 2020-02-06 NOTE — Telephone Encounter (Signed)
Called patient to speak with him regarding side effects of sildenafil. He stated that last Friday he started experiencing nasal congestion so bad that he could not breath through his nose. He tried both Nasacort and saline nasal spray with no relief. Additionally, he developed lower back and hip pain so bad that he could not sleep. He tried sleeping on his stomach to get relief, but then his oxygen would drop. He stopped the sildenafil on Sunday and both the back pain and nasal congestion have cleared. These are both known side effects of sildenafil. Given that patient has had a poor reaction to the sildenafil, I think it is reasonable to stop the sildenafil. I will forward this message to Dr. Haroldine Laws to determine if he would like to start him on any other PAH therapy since he cannot tolerate the sildenafil.   Audry Riles, PharmD, BCPS, BCCP, CPP [facility] Failure [facility] Pharmacist [phone]

## 2020-02-06 NOTE — Patient Instructions (Signed)
Medication Instructions:  Stop Carvedilol  Start Crestor 5 mg daily   *If you need a refill on your cardiac medications before your next appointment, please call your pharmacy*   Follow-Up: At [facility], you and your health needs are our priority.  As part of our continuing mission to provide you with exceptional heart care, we have created designated Provider Care Teams.  These Care Teams include your primary Cardiologist (physician) and Advanced Practice Providers (APPs -  Physician Assistants and Nurse Practitioners) who all work together to provide you with the care you need, when you need it.  We recommend signing up for the patient portal called "MyChart".  Sign up information is provided on this After Visit Summary.  MyChart is used to connect with patients for Virtual Visits (Telemedicine).  Patients are able to view lab/test results, encounter notes, upcoming appointments, etc.  Non-urgent messages can be sent to your provider as well.   To learn more about what you can do with MyChart, go to [email or web address].    Your next appointment:   6 month(s)  The format for your next appointment:   In Person  Provider:   Eleonore Chiquito, MD

## 2020-02-07 ENCOUNTER — Ambulatory Visit (HOSPITAL_COMMUNITY)
Admission: RE | Admit: 2020-02-07 | Discharge: 2020-02-07 | Disposition: A | Discharge status: Home / Self Care | Payer: Medicare Other | Source: Ambulatory Visit | Attending: Internal Medicine | Admitting: Internal Medicine

## 2020-02-07 DIAGNOSIS — I272 Pulmonary hypertension, unspecified: Secondary | ICD-10-CM | POA: Insufficient documentation

## 2020-02-07 LAB — BASIC METABOLIC PANEL
Anion gap: 11 (ref 5–15)
BUN: 19 mg/dL (ref 8–23)
CO2: 26 mmol/L (ref 22–32)
Calcium: 9.8 mg/dL (ref 8.9–10.3)
Chloride: 101 mmol/L (ref 98–111)
Creatinine, Ser: 0.97 mg/dL (ref 0.61–1.24)
GFR calc Af Amer: >60 mL/min (ref >60)
GFR calc non Af Amer: >60 mL/min (ref >60)
Glucose, Bld: 89 mg/dL (ref 70–99)
Potassium: 3.9 mmol/L (ref 3.5–5.1)
Sodium: 138 mmol/L (ref 135–145)

## 2020-03-14 ENCOUNTER — Encounter (HOSPITAL_COMMUNITY): Payer: Self-pay

## 2020-03-14 ENCOUNTER — Other Ambulatory Visit: Payer: Self-pay

## 2020-03-14 ENCOUNTER — Encounter (HOSPITAL_COMMUNITY)
Admission: RE | Admit: 2020-03-14 | Discharge: 2020-03-14 | Disposition: A | Discharge status: Home / Self Care | Payer: Medicare Other | Source: Ambulatory Visit | Attending: Internal Medicine | Admitting: Internal Medicine

## 2020-03-14 VITALS — BP 96/64 | HR 66 | Ht 69.0 in | Wt 195.0 lb

## 2020-03-14 DIAGNOSIS — I272 Pulmonary hypertension, unspecified: Secondary | ICD-10-CM | POA: Diagnosis not present

## 2020-03-14 NOTE — Progress Notes (Signed)
Cardiac/Pulmonary Rehab Medication Review by a Pharmacist  Does the patient  feel that his/her medications are working for him/her?  yes  Has the patient been experiencing any side effects to the medications prescribed?  yes  Does the patient measure his/her own blood pressure or blood glucose at home?  yes   Does the patient have any problems obtaining medications due to transportation or finances?   no  Understanding of regimen: excellent Understanding of indications: good Potential of compliance: excellent  Questions asked to Determine Patient Understanding of Medication Regimen:  1. What is the name of the medication?  2. What is the medication used for?  3. When should it be taken?  4. How much should be taken?  5. How will you take it?  6. What side effects should you report?  Understanding Defined as: Excellent: All questions above are correct Good: Questions 1-4 are correct Fair: Questions 1-2 are correct  Poor: 1 or none of the above questions are correct   Pharmacist comments: Patient presents today for pulmonary rehab. The patient has idiopathic pulmonary HTN and is followed by Dr.Bensimhon.We reviewed his medications and updated med rec. He is not tolerating K-dur 40meq, consider changing to 48meq tablets. He feels like it gets stuck in his throat since it is so big. He also has heartburn and taking tums as a result. I also suggested to soften in water and/or break in half. In addition, he is having issues with sildenafil, occasionally his head feels like it opens up(a head rush) then it goes dissipates.  He is managing pain in his legs with tylenol but concerned with taking too much. (he is taking 3000mg  daily divided 3 times a day).  He is very knowledgeable about regimen and is compliant.He monitor his BP and HR daily and as needed. He knows that the pulmonary HTN will not go away, but hopes with medication therapy and pulmonary rehab he can improve.  Thanks for the  opportunity to participate in the care of this patient,   Isac Sarna, BS [place], [place] Pharmacist Pager [phone] 03/14/2020 2:07 PM

## 2020-03-14 NOTE — Progress Notes (Signed)
Pulmonary Individual Treatment Plan  Patient Details  Name: Warren [name] MRN: [id number] Date of Birth: December 23, 1950 Referring Provider:     PULMONARY REHAB OTHER RESP ORIENTATION from 03/14/2020 in [place]  Referring Provider Dr. Haroldine Laws      Initial Encounter Date:    [place] from 03/14/2020 in [place]  Date 03/14/20      Visit Diagnosis: Pulmonary hypertension, unspecified ([place])  Patient's Home Medications on Admission:   Current Outpatient Medications:  .  sildenafil (REVATIO) 20 MG tablet, Take 20 mg by mouth 3 (three) times daily., Disp: , Rfl:  .  acetaminophen (TYLENOL) 500 MG tablet, Take 1,000 mg by mouth every 6 (six) hours as needed for moderate pain or headache., Disp: , Rfl:  .  allopurinol (ZYLOPRIM) 300 MG tablet, Take 300 mg by mouth daily., Disp: , Rfl:  .  aspirin EC 81 MG tablet, Take 1 tablet (81 mg total) by mouth daily., Disp: 90 tablet, Rfl: 3 .  ezetimibe (ZETIA) 10 MG tablet, Take 10 mg by mouth daily., Disp: , Rfl:  .  Flaxseed, Linseed, (FLAX SEEDS PO), Take 500 mg by mouth daily., Disp: , Rfl:  .  hydrochlorothiazide (HYDRODIURIL) 25 MG tablet, Take 25 mg by mouth daily. , Disp: , Rfl:  .  macitentan (OPSUMIT) 10 MG tablet, Take 1 tablet (10 mg total) by mouth daily., Disp: 30 tablet, Rfl: 11 .  Menthol-Methyl Salicylate (MUSCLE RUB) 10-15 % CREA, Apply 1 application topically as needed for muscle pain., Disp: , Rfl:  .  Omega-3 Fatty Acids (FISH OIL) 1000 MG CAPS, Take 1,000 mg by mouth daily., Disp: , Rfl:  .  OXYGEN, Inhale 2-3 L into the lungs continuous. 2 L at rest and with activity 3 L, Disp: , Rfl:  .  oxymetazoline (AFRIN NASAL SPRAY) 0.05 % nasal spray, Place 2 sprays into both nostrils 2 (two) times daily as needed for congestion., Disp: , Rfl:  .  potassium chloride SA (KLOR-CON) 20 MEQ tablet, Take 1 tablet (20 mEq total) by mouth daily., Disp: 30 tablet, Rfl:  6 .  rosuvastatin (CRESTOR) 5 MG tablet, Take 1 tablet (5 mg total) by mouth daily., Disp: 30 tablet, Rfl: 3 No current facility-administered medications for this encounter.  Facility-Administered Medications Ordered in Other Encounters:  .  sodium chloride flush (NS) 0.9 % injection 10 mL, 10 mL, Intravenous, PRN, [name], Cassie Freer, MD, 20 mL at 12/05/19 1125  Past Medical History: Past Medical History:  Diagnosis Date  . Coronary artery disease   . Hyperlipidemia   . Hypertension     Tobacco Use: Social History   Tobacco Use  Smoking Status Former Smoker  . Quit date: 05/24/2002  . Years since quitting: 17.8  Smokeless Tobacco Never Used    Labs: Recent Chemical engineer    Labs for ITP Cardiac and Pulmonary Rehab Latest Ref Rng & Units 11/15/2008 11/16/2008 12/14/2019 12/14/2019 12/14/2019   Cholestrol 0 - 200 mg/dL - 133 - - -   LDLCALC 0 - 99 mg/dL - 68 - - -   HDL >39 mg/dL - 41 - - -   Trlycerides <150 mg/dL - 120 - - -   Hemoglobin A1c % 5.5 - - - -   PHART 7.35 - 7.45 - - 7.421 - -   PCO2ART 32 - 48 mmHg - - 37.7 - -   HCO3 20.0 - 28.0 mmol/L - - 24.5 20.8 27.0  TCO2 22 - 32 mmol/L - - 26 22 28    ACIDBASEDEF 0.0 - 2.0 mmol/L - - - 4.0(H) -   O2SAT % - - 92.0 69.0 71.0      Capillary Blood Glucose: No results found for: GLUCAP   Pulmonary Assessment Scores:  Pulmonary Assessment Scores    Row Name 03/14/20 1428         ADL UCSD   SOB Score total 24       CAT Score   CAT Score 17       mMRC Score   mMRC Score 4           UCSD: Self-administered rating of dyspnea associated with activities of daily living (ADLs) 6-point scale (0 = "not at all" to 5 = "maximal or unable to do because of breathlessness")  Scoring Scores range from 0 to 120.  Minimally important difference is 5 units  CAT: CAT can identify the health impairment of COPD patients and is better correlated with disease progression.  CAT has a scoring range of zero to 40. The CAT  score is classified into four groups of low (less than 10), medium (10 - 20), high (21-30) and very high (31-40) based on the impact level of disease on health status. A CAT score over 10 suggests significant symptoms.  A worsening CAT score could be explained by an exacerbation, poor medication adherence, poor inhaler technique, or progression of COPD or comorbid conditions.  CAT MCID is 2 points  mMRC: mMRC (Modified Medical Research Council) Dyspnea Scale is used to assess the degree of baseline functional disability in patients of respiratory disease due to dyspnea. No minimal important difference is established. A decrease in score of 1 point or greater is considered a positive change.   Pulmonary Function Assessment:  Pulmonary Function Assessment - 03/14/20 1309      Breath   Shortness of Breath Yes;Limiting activity           Exercise Target Goals: Exercise Program Goal: Individual exercise prescription set using results from initial 6 min walk test and THRR while considering  patient's activity barriers and safety.   Exercise Prescription Goal: Initial exercise prescription builds to 30-45 minutes a day of aerobic activity, 2-3 days per week.  Home exercise guidelines will be given to patient during program as part of exercise prescription that the participant will acknowledge.  Activity Barriers & Risk Stratification:  Activity Barriers & Cardiac Risk Stratification - 03/14/20 1302      Activity Barriers & Cardiac Risk Stratification   Activity Barriers Arthritis;Back Problems;Neck/Spine Problems;Joint Problems;Deconditioning;Muscular Weakness;Shortness of Breath    Cardiac Risk Stratification Moderate           6 Minute Walk:  6 Minute Walk    Row Name 03/14/20 1449         6 Minute Walk   Phase Initial     Distance 950 feet     Walk Time 6 minutes     # of Rest Breaks 1     MPH 1.8     METS 2.61     RPE 13     Perceived Dyspnea  15     VO2 Peak 9.13      Symptoms Yes (comment)     Comments Pt stopped for 1 min 15 seconds to allow for SpO2 to increase to >80%     Resting HR 70 bpm     Resting BP 96/64     Resting Oxygen Saturation  96 %     Exercise Oxygen Saturation  during 6 min walk 78 %     Max Ex. HR 115 bpm     Max Ex. BP 144/82     2 Minute Post BP 110/56       Interval HR   1 Minute HR 84     2 Minute HR 114     3 Minute HR 95     4 Minute HR 108     5 Minute HR 115     6 Minute HR 115     2 Minute Post HR 88     Interval Heart Rate? Yes       Interval Oxygen   Interval Oxygen? Yes     Baseline Oxygen Saturation % 96 %     1 Minute Oxygen Saturation % 87 %     1 Minute Liters of Oxygen 4 L     2 Minute Oxygen Saturation % 78 %     2 Minute Liters of Oxygen 6 L     3 Minute Oxygen Saturation % 82 %     3 Minute Liters of Oxygen 8 L     4 Minute Oxygen Saturation % 85 %     4 Minute Liters of Oxygen 8 L     5 Minute Oxygen Saturation % 81 %     5 Minute Liters of Oxygen 8 L     6 Minute Oxygen Saturation % 78 %     6 Minute Liters of Oxygen 8 L     2 Minute Post Oxygen Saturation % 89 %     2 Minute Post Liters of Oxygen 8 L            Oxygen Initial Assessment:  Oxygen Initial Assessment - 03/14/20 1428      Initial 6 min Walk   Oxygen Used Continuous    Liters per minute 8      Program Oxygen Prescription   Program Oxygen Prescription Continuous    Liters per minute 8      Intervention   Short Term Goals To learn and exhibit compliance with exercise, home and travel O2 prescription;To learn and understand importance of monitoring SPO2 with pulse oximeter and demonstrate accurate use of the pulse oximeter.;To learn and demonstrate proper use of respiratory medications;To learn and demonstrate proper pursed lip breathing techniques or other breathing techniques.;To learn and understand importance of maintaining oxygen saturations>88%    Long  Term Goals Exhibits compliance with exercise, home and travel  O2 prescription;Verbalizes importance of monitoring SPO2 with pulse oximeter and return demonstration;Maintenance of O2 saturations>88%;Exhibits proper breathing techniques, such as pursed lip breathing or other method taught during program session;Compliance with respiratory medication;Demonstrates proper use of MDI's           Oxygen Re-Evaluation:   Oxygen Discharge (Final Oxygen Re-Evaluation):   Initial Exercise Prescription:  Initial Exercise Prescription - 03/14/20 1400      Date of Initial Exercise RX and Referring Provider   Date 03/14/20    Referring Provider Dr. Haroldine Laws    Expected Discharge Date 07/18/20      Oxygen   Oxygen Continuous    Liters 8      NuStep   Level 1    SPM 80    Minutes 39      Prescription Details   Frequency (times per week) 2    Duration Progress to 30 minutes of continuous aerobic without signs/symptoms  of physical distress      Intensity   THRR 40-80% of Max Heartrate 61-122    Ratings of Perceived Exertion 11-13    Perceived Dyspnea 0-4      Resistance Training   Training Prescription Yes    Weight 2 lbs    Reps 10-15           Perform Capillary Blood Glucose checks as needed.  Exercise Prescription Changes:   Exercise Comments:   Exercise Goals and Review:  Exercise Goals    Row Name 03/14/20 1453             Exercise Goals   Increase Physical Activity Yes       Intervention Provide advice, education, support and counseling about physical activity/exercise needs.;Develop an individualized exercise prescription for aerobic and resistive training based on initial evaluation findings, risk stratification, comorbidities and participant's personal goals.       Expected Outcomes Short Term: Attend rehab on a regular basis to increase amount of physical activity.;Long Term: Add in home exercise to make exercise part of routine and to increase amount of physical activity.;Long Term: Exercising regularly at least 3-5  days a week.       Increase Strength and Stamina Yes       Intervention Provide advice, education, support and counseling about physical activity/exercise needs.;Develop an individualized exercise prescription for aerobic and resistive training based on initial evaluation findings, risk stratification, comorbidities and participant's personal goals.       Expected Outcomes Short Term: Increase workloads from initial exercise prescription for resistance, speed, and METs.;Short Term: Perform resistance training exercises routinely during rehab and add in resistance training at home;Long Term: Improve cardiorespiratory fitness, muscular endurance and strength as measured by increased METs and functional capacity (6MWT)       Able to understand and use rate of perceived exertion (RPE) scale Yes       Intervention Provide education and explanation on how to use RPE scale       Expected Outcomes Short Term: Able to use RPE daily in rehab to express subjective intensity level;Long Term:  Able to use RPE to guide intensity level when exercising independently       Able to understand and use Dyspnea scale Yes       Intervention Provide education and explanation on how to use Dyspnea scale       Expected Outcomes Short Term: Able to use Dyspnea scale daily in rehab to express subjective sense of shortness of breath during exertion;Long Term: Able to use Dyspnea scale to guide intensity level when exercising independently       Knowledge and understanding of Target Heart Rate Range (THRR) Yes       Intervention Provide education and explanation of THRR including how the numbers were predicted and where they are located for reference       Expected Outcomes Short Term: Able to state/look up THRR;Long Term: Able to use THRR to govern intensity when exercising independently;Short Term: Able to use daily as guideline for intensity in rehab       Understanding of Exercise Prescription Yes       Intervention Provide  education, explanation, and written materials on patient's individual exercise prescription       Expected Outcomes Short Term: Able to explain program exercise prescription;Long Term: Able to explain home exercise prescription to exercise independently              Exercise Goals Re-Evaluation :  Discharge Exercise Prescription (Final Exercise Prescription Changes):   Nutrition:  Target Goals: Understanding of nutrition guidelines, daily intake of sodium 1500mg , cholesterol 200mg , calories 30% from fat and 7% or less from saturated fats, daily to have 5 or more servings of fruits and vegetables.  Biometrics:  Pre Biometrics - 03/14/20 1454      Pre Biometrics   Height 5\' 9"  (1.753 m)    Weight 195 lb (88.5 kg)    Waist Circumference 42 inches    Hip Circumference 44 inches    Waist to Hip Ratio 0.95 %    BMI (Calculated) 28.78    Triceps Skinfold 19 mm    % Body Fat 29.8 %    Grip Strength 36.4 kg    Flexibility 0 in    Single Leg Stand 18.84 seconds            Nutrition Therapy Plan and Nutrition Goals:   Nutrition Assessments:  Nutrition Assessments - 03/14/20 1429      MEDFICTS Scores   Pre Score 36           Nutrition Goals Re-Evaluation:   Nutrition Goals Discharge (Final Nutrition Goals Re-Evaluation):   Psychosocial: Target Goals: Acknowledge presence or absence of significant depression and/or stress, maximize coping skills, provide positive support system. Participant is able to verbalize types and ability to use techniques and skills needed for reducing stress and depression.  Initial Review & Psychosocial Screening:  Initial Psych Review & Screening - 03/14/20 1310      Initial Review   Current issues with None Identified      Family Dynamics   Good Support System? Yes    Comments Great support system with wife and his brothers      Barriers   Psychosocial barriers to participate in program There are no identifiable barriers or  psychosocial needs.      Screening Interventions   Interventions Encouraged to exercise           Quality of Life Scores:  Quality of Life - 03/14/20 1448      Quality of Life   Select Quality of Life      Quality of Life Scores   Health/Function Pre 13.5 %    Socioeconomic Pre 26.25 %    Psych/Spiritual Pre 14.64 %    Family Pre 26.38 %    GLOBAL Pre 17.18 %          Scores of 19 and below usually indicate a poorer quality of life in these areas.  A difference of  2-3 points is a clinically meaningful difference.  A difference of 2-3 points in the total score of the Quality of Life Index has been associated with significant improvement in overall quality of life, self-image, physical symptoms, and general health in studies assessing change in quality of life.   PHQ-9: Recent Review Flowsheet Data    Depression screen [facility] 2/9 03/14/2020   Decreased Interest 0   Down, Depressed, Hopeless 0   PHQ - 2 Score 0   Altered sleeping 0   Tired, decreased energy 1   Change in appetite 1   Feeling bad or failure about yourself  0   Trouble concentrating 0   Moving slowly or fidgety/restless 0   Suicidal thoughts 0   PHQ-9 Score 2   Difficult doing work/chores Not difficult at all     Interpretation of Total Score  Total Score Depression Severity:  1-4 = Minimal depression, 5-9 = Mild depression,  10-14 = Moderate depression, 15-19 = Moderately severe depression, 20-27 = Severe depression   Psychosocial Evaluation and Intervention:   Psychosocial Re-Evaluation:   Psychosocial Discharge (Final Psychosocial Re-Evaluation):    Education: Education Goals: Education classes will be provided on a weekly basis, covering required topics. Participant will state understanding/return demonstration of topics presented.  Learning Barriers/Preferences:  Learning Barriers/Preferences - 03/14/20 1312      Learning Barriers/Preferences   Learning Barriers None    Learning  Preferences Skilled Demonstration           Education Topics: How Lungs Work and Diseases: - Discuss the anatomy of the lungs and diseases that can affect the lungs, such as COPD.   Exercise: -Discuss the importance of exercise, FITT principles of exercise, normal and abnormal responses to exercise, and how to exercise safely.   Environmental Irritants: -Discuss types of environmental irritants and how to limit exposure to environmental irritants.   Meds/Inhalers and oxygen: - Discuss respiratory medications, definition of an inhaler and oxygen, and the proper way to use an inhaler and oxygen.   Energy Saving Techniques: - Discuss methods to conserve energy and decrease shortness of breath when performing activities of daily living.    Bronchial Hygiene / Breathing Techniques: - Discuss breathing mechanics, pursed-lip breathing technique,  proper posture, effective ways to clear airways, and other functional breathing techniques   Cleaning Equipment: - Provides group verbal and written instruction about the health risks of elevated stress, cause of high stress, and healthy ways to reduce stress.   Nutrition I: Fats: - Discuss the types of cholesterol, what cholesterol does to the body, and how cholesterol levels can be controlled.   Nutrition II: Labels: -Discuss the different components of food labels and how to read food labels.   Respiratory Infections: - Discuss the signs and symptoms of respiratory infections, ways to prevent respiratory infections, and the importance of seeking medical treatment when having a respiratory infection.   Stress I: Signs and Symptoms: - Discuss the causes of stress, how stress may lead to anxiety and depression, and ways to limit stress.   Stress II: Relaxation: -Discuss relaxation techniques to limit stress.   Oxygen for Home/Travel: - Discuss how to prepare for travel when on oxygen and proper ways to transport and store  oxygen to ensure safety.   Knowledge Questionnaire Score:  Knowledge Questionnaire Score - 03/14/20 1429      Knowledge Questionnaire Score   Pre Score 5/18           Core Components/Risk Factors/Patient Goals at Admission:  Personal Goals and Risk Factors at Admission - 03/14/20 1314      Core Components/Risk Factors/Patient Goals on Admission    Weight Management Weight Loss;Yes    Admit Weight 193 lb (87.5 kg)    Goal Weight: Short Term 185 lb (83.9 kg)    Expected Outcomes Short Term: Continue to assess and modify interventions until short term weight is achieved;Long Term: Adherence to nutrition and physical activity/exercise program aimed toward attainment of established weight goal;Weight Maintenance: Understanding of the daily nutrition guidelines, which includes 25-35% calories from fat, 7% or less cal from saturated fats, less than 200mg  cholesterol, less than 1.5gm of sodium, & 5 or more servings of fruits and vegetables daily;Weight Loss: Understanding of general recommendations for a balanced deficit meal plan, which promotes 1-2 lb weight loss per week and includes a negative energy balance of [phone] kcal/d;Understanding recommendations for meals to include 15-35% energy as protein, 25-35% energy  from fat, 35-60% energy from carbohydrates, less than 200mg  of dietary cholesterol, 20-35 gm of total fiber daily;Understanding of distribution of calorie intake throughout the day with the consumption of 4-5 meals/snacks;Weight Gain: Understanding of general recommendations for a high calorie, high protein meal plan that promotes weight gain by distributing calorie intake throughout the day with the consumption for 4-5 meals, snacks, and/or supplements    Improve shortness of breath with ADL's Yes    Intervention Provide education, individualized exercise plan and daily activity instruction to help decrease symptoms of SOB with activities of daily living.    Expected Outcomes Short  Term: Improve cardiorespiratory fitness to achieve a reduction of symptoms when performing ADLs;Long Term: Be able to perform more ADLs without symptoms or delay the onset of symptoms           Core Components/Risk Factors/Patient Goals Review:    Core Components/Risk Factors/Patient Goals at Discharge (Final Review):    ITP Comments:   Comments: Patient arrived for orientation at [facility] pulmonary rehab. Patient was referred to [facility] by Dr. Haroldine Laws due to pulmonary hypertension. During orientation advised patient on arrival and appointment times what to wear, what to do before, during and after exercise. Reviewed attendance and class policy.  Pt is scheduled to return Pulmonary Rehab 10/26  at 1330. Pt was advised to come to class 15 minutes before class starts.  Discussed RPE/Dpysnea scales.  Patient was able to complete 6 minute walk test. Patient was measured for the equipment. Discussed equipment safety with patient. Took patient pre-anthropometric measurements. Patient finished visit at 1430.

## 2020-03-18 ENCOUNTER — Encounter (HOSPITAL_COMMUNITY)
Admission: RE | Admit: 2020-03-18 | Discharge: 2020-03-18 | Disposition: A | Discharge status: Home / Self Care | Payer: Medicare Other | Source: Ambulatory Visit | Attending: Internal Medicine | Admitting: Internal Medicine

## 2020-03-18 ENCOUNTER — Other Ambulatory Visit: Payer: Self-pay

## 2020-03-18 VITALS — Wt 192.5 lb

## 2020-03-18 DIAGNOSIS — I272 Pulmonary hypertension, unspecified: Secondary | ICD-10-CM | POA: Diagnosis not present

## 2020-03-18 NOTE — Progress Notes (Signed)
Daily Session Note  Patient Details  Name: Warren [name] MRN: [id number] Date of Birth: 11-10-1950 Referring Provider:     PULMONARY REHAB OTHER RESP ORIENTATION from 03/14/2020 in [place]  Referring Provider Dr. Haroldine Laws      Encounter Date: 03/18/2020  Check In:  Session Check In - 03/18/20 1351      Check-In   Supervising physician immediately available to respond to emergencies [facility] MD immediately available    Physician(s) Dr. Harl Bowie    Location [facility]    Staff Present Geanie Cooley, RN;Jett Fukuda Kris Mouton, MS, ACSM-CEP, Exercise Physiologist    Virtual Visit No    Medication changes reported     No    Fall or balance concerns reported    No    Tobacco Cessation No Change    Warm-up and Cool-down Performed on first and last piece of equipment    Resistance Training Performed Yes    VAD Patient? No    PAD/SET Patient? No      Pain Assessment   Currently in Pain? No/denies    Pain Score 0-No pain    Multiple Pain Sites No           Capillary Blood Glucose: No results found for this or any previous visit (from the past 24 hour(s)).    Social History   Tobacco Use  Smoking Status Former Smoker  . Quit date: 05/24/2002  . Years since quitting: 17.8  Smokeless Tobacco Never Used    Goals Met:  Independence with exercise equipment Exercise tolerated well No report of cardiac concerns or symptoms Strength training completed today  Goals Unmet:  Not Applicable  Comments: checkout time is 1430   Dr. Kathie Dike is Medical Director for [facility] Pulmonary Rehab.

## 2020-03-19 DIAGNOSIS — Z8739 Personal history of other diseases of the musculoskeletal system and connective tissue: Secondary | ICD-10-CM | POA: Diagnosis not present

## 2020-03-19 DIAGNOSIS — R0902 Hypoxemia: Secondary | ICD-10-CM | POA: Diagnosis not present

## 2020-03-19 DIAGNOSIS — I10 Essential (primary) hypertension: Secondary | ICD-10-CM | POA: Diagnosis not present

## 2020-03-19 DIAGNOSIS — J449 Chronic obstructive pulmonary disease, unspecified: Secondary | ICD-10-CM | POA: Diagnosis not present

## 2020-03-19 DIAGNOSIS — E7849 Other hyperlipidemia: Secondary | ICD-10-CM | POA: Diagnosis not present

## 2020-03-19 DIAGNOSIS — R911 Solitary pulmonary nodule: Secondary | ICD-10-CM | POA: Diagnosis not present

## 2020-03-20 ENCOUNTER — Encounter (HOSPITAL_COMMUNITY)
Admission: RE | Admit: 2020-03-20 | Discharge: 2020-03-20 | Disposition: A | Discharge status: Home / Self Care | Payer: Medicare Other | Source: Ambulatory Visit | Attending: Internal Medicine | Admitting: Internal Medicine

## 2020-03-20 ENCOUNTER — Other Ambulatory Visit: Payer: Self-pay

## 2020-03-20 DIAGNOSIS — I272 Pulmonary hypertension, unspecified: Secondary | ICD-10-CM | POA: Diagnosis not present

## 2020-03-20 NOTE — Progress Notes (Signed)
Pulmonary Individual Treatment Plan  Patient Details  Name: Warren [name] MRN: [id number] Date of Birth: 1950-09-04 Referring Provider:     PULMONARY REHAB OTHER RESP ORIENTATION from 03/14/2020 in [place]  Referring Provider Dr. Haroldine Laws      Initial Encounter Date:    [place] from 03/14/2020 in [facility]  Date 03/14/20      Visit Diagnosis: Pulmonary hypertension, unspecified ([place])  Patient's Home Medications on Admission:   Current Outpatient Medications:  .  acetaminophen (TYLENOL) 500 MG tablet, Take 1,000 mg by mouth every 6 (six) hours as needed for moderate pain or headache., Disp: , Rfl:  .  allopurinol (ZYLOPRIM) 300 MG tablet, Take 300 mg by mouth daily., Disp: , Rfl:  .  aspirin EC 81 MG tablet, Take 1 tablet (81 mg total) by mouth daily., Disp: 90 tablet, Rfl: 3 .  ezetimibe (ZETIA) 10 MG tablet, Take 10 mg by mouth daily., Disp: , Rfl:  .  Flaxseed, Linseed, (FLAX SEEDS PO), Take 500 mg by mouth daily., Disp: , Rfl:  .  hydrochlorothiazide (HYDRODIURIL) 25 MG tablet, Take 25 mg by mouth daily. , Disp: , Rfl:  .  macitentan (OPSUMIT) 10 MG tablet, Take 1 tablet (10 mg total) by mouth daily., Disp: 30 tablet, Rfl: 11 .  Menthol-Methyl Salicylate (MUSCLE RUB) 10-15 % CREA, Apply 1 application topically as needed for muscle pain., Disp: , Rfl:  .  Omega-3 Fatty Acids (FISH OIL) 1000 MG CAPS, Take 1,000 mg by mouth daily., Disp: , Rfl:  .  OXYGEN, Inhale 2-3 L into the lungs continuous. 2 L at rest and with activity 3 L, Disp: , Rfl:  .  oxymetazoline (AFRIN NASAL SPRAY) 0.05 % nasal spray, Place 2 sprays into both nostrils 2 (two) times daily as needed for congestion., Disp: , Rfl:  .  potassium chloride SA (KLOR-CON) 20 MEQ tablet, Take 1 tablet (20 mEq total) by mouth daily., Disp: 30 tablet, Rfl: 6 .  rosuvastatin (CRESTOR) 5 MG tablet, Take 1 tablet (5 mg total) by mouth daily., Disp: 30  tablet, Rfl: 3 .  sildenafil (REVATIO) 20 MG tablet, Take 20 mg by mouth 3 (three) times daily., Disp: , Rfl:  No current facility-administered medications for this encounter.  Facility-Administered Medications Ordered in Other Encounters:  .  sodium chloride flush (NS) 0.9 % injection 10 mL, 10 mL, Intravenous, PRN, [name], Cassie Freer, MD, 20 mL at 12/05/19 1125  Past Medical History: Past Medical History:  Diagnosis Date  . Coronary artery disease   . Hyperlipidemia   . Hypertension     Tobacco Use: Social History   Tobacco Use  Smoking Status Former Smoker  . Quit date: 05/24/2002  . Years since quitting: 17.8  Smokeless Tobacco Never Used    Labs: Recent Chemical engineer    Labs for ITP Cardiac and Pulmonary Rehab Latest Ref Rng & Units 11/15/2008 11/16/2008 12/14/2019 12/14/2019 12/14/2019   Cholestrol 0 - 200 mg/dL - 133 - - -   LDLCALC 0 - 99 mg/dL - 68 - - -   HDL >39 mg/dL - 41 - - -   Trlycerides <150 mg/dL - 120 - - -   Hemoglobin A1c % 5.5 - - - -   PHART 7.35 - 7.45 - - 7.421 - -   PCO2ART 32 - 48 mmHg - - 37.7 - -   HCO3 20.0 - 28.0 mmol/L - - 24.5 20.8 27.0  TCO2 22 - 32 mmol/L - - 26 22 28    ACIDBASEDEF 0.0 - 2.0 mmol/L - - - 4.0(H) -   O2SAT % - - 92.0 69.0 71.0      Capillary Blood Glucose: No results found for: GLUCAP   Pulmonary Assessment Scores:  Pulmonary Assessment Scores    Row Name 03/14/20 1428         ADL UCSD   SOB Score total 24       CAT Score   CAT Score 17       mMRC Score   mMRC Score 4           UCSD: Self-administered rating of dyspnea associated with activities of daily living (ADLs) 6-point scale (0 = "not at all" to 5 = "maximal or unable to do because of breathlessness")  Scoring Scores range from 0 to 120.  Minimally important difference is 5 units  CAT: CAT can identify the health impairment of COPD patients and is better correlated with disease progression.  CAT has a scoring range of zero to 40. The  CAT score is classified into four groups of low (less than 10), medium (10 - 20), high (21-30) and very high (31-40) based on the impact level of disease on health status. A CAT score over 10 suggests significant symptoms.  A worsening CAT score could be explained by an exacerbation, poor medication adherence, poor inhaler technique, or progression of COPD or comorbid conditions.  CAT MCID is 2 points  mMRC: mMRC (Modified Medical Research Council) Dyspnea Scale is used to assess the degree of baseline functional disability in patients of respiratory disease due to dyspnea. No minimal important difference is established. A decrease in score of 1 point or greater is considered a positive change.   Pulmonary Function Assessment:  Pulmonary Function Assessment - 03/14/20 1309      Breath   Shortness of Breath Yes;Limiting activity           Exercise Target Goals: Exercise Program Goal: Individual exercise prescription set using results from initial 6 min walk test and THRR while considering  patient's activity barriers and safety.   Exercise Prescription Goal: Initial exercise prescription builds to 30-45 minutes a day of aerobic activity, 2-3 days per week.  Home exercise guidelines will be given to patient during program as part of exercise prescription that the participant will acknowledge.  Activity Barriers & Risk Stratification:  Activity Barriers & Cardiac Risk Stratification - 03/14/20 1302      Activity Barriers & Cardiac Risk Stratification   Activity Barriers Arthritis;Back Problems;Neck/Spine Problems;Joint Problems;Deconditioning;Muscular Weakness;Shortness of Breath    Cardiac Risk Stratification Moderate           6 Minute Walk:  6 Minute Walk    Row Name 03/14/20 1449         6 Minute Walk   Phase Initial     Distance 950 feet     Walk Time 6 minutes     # of Rest Breaks 1     MPH 1.8     METS 2.61     RPE 13     Perceived Dyspnea  15     VO2 Peak 9.13      Symptoms Yes (comment)     Comments Pt stopped for 1 min 15 seconds to allow for SpO2 to increase to >80%     Resting HR 70 bpm     Resting BP 96/64     Resting Oxygen Saturation  96 %     Exercise Oxygen Saturation  during 6 min walk 78 %     Max Ex. HR 115 bpm     Max Ex. BP 144/82     2 Minute Post BP 110/56       Interval HR   1 Minute HR 84     2 Minute HR 114     3 Minute HR 95     4 Minute HR 108     5 Minute HR 115     6 Minute HR 115     2 Minute Post HR 88     Interval Heart Rate? Yes       Interval Oxygen   Interval Oxygen? Yes     Baseline Oxygen Saturation % 96 %     1 Minute Oxygen Saturation % 87 %     1 Minute Liters of Oxygen 4 L     2 Minute Oxygen Saturation % 78 %     2 Minute Liters of Oxygen 6 L     3 Minute Oxygen Saturation % 82 %     3 Minute Liters of Oxygen 8 L     4 Minute Oxygen Saturation % 85 %     4 Minute Liters of Oxygen 8 L     5 Minute Oxygen Saturation % 81 %     5 Minute Liters of Oxygen 8 L     6 Minute Oxygen Saturation % 78 %     6 Minute Liters of Oxygen 8 L     2 Minute Post Oxygen Saturation % 89 %     2 Minute Post Liters of Oxygen 8 L            Oxygen Initial Assessment:  Oxygen Initial Assessment - 03/14/20 1428      Initial 6 min Walk   Oxygen Used Continuous    Liters per minute 8      Program Oxygen Prescription   Program Oxygen Prescription Continuous    Liters per minute 8      Intervention   Short Term Goals To learn and exhibit compliance with exercise, home and travel O2 prescription;To learn and understand importance of monitoring SPO2 with pulse oximeter and demonstrate accurate use of the pulse oximeter.;To learn and demonstrate proper use of respiratory medications;To learn and demonstrate proper pursed lip breathing techniques or other breathing techniques.;To learn and understand importance of maintaining oxygen saturations>88%    Long  Term Goals Exhibits compliance with exercise, home and  travel O2 prescription;Verbalizes importance of monitoring SPO2 with pulse oximeter and return demonstration;Maintenance of O2 saturations>88%;Exhibits proper breathing techniques, such as pursed lip breathing or other method taught during program session;Compliance with respiratory medication;Demonstrates proper use of MDI's           Oxygen Re-Evaluation:  Oxygen Re-Evaluation    Row Name 03/18/20 1443             Program Oxygen Prescription   Program Oxygen Prescription Continuous       Liters per minute 8         Home Oxygen   Home Oxygen Device E-Tanks;Home Concentrator       Sleep Oxygen Prescription Continuous       Liters per minute 3       Home Exercise Oxygen Prescription Continuous       Liters per minute 8       Home Resting Oxygen Prescription Continuous  Liters per minute 3       Compliance with Home Oxygen Use Yes         Goals/Expected Outcomes   Short Term Goals To learn and exhibit compliance with exercise, home and travel O2 prescription;To learn and understand importance of monitoring SPO2 with pulse oximeter and demonstrate accurate use of the pulse oximeter.;To learn and demonstrate proper use of respiratory medications;To learn and demonstrate proper pursed lip breathing techniques or other breathing techniques.;To learn and understand importance of maintaining oxygen saturations>88%       Long  Term Goals Exhibits compliance with exercise, home and travel O2 prescription;Verbalizes importance of monitoring SPO2 with pulse oximeter and return demonstration;Maintenance of O2 saturations>88%;Exhibits proper breathing techniques, such as pursed lip breathing or other method taught during program session;Compliance with respiratory medication;Demonstrates proper use of MDI's       Goals/Expected Outcomes compliance              Oxygen Discharge (Final Oxygen Re-Evaluation):  Oxygen Re-Evaluation - 03/18/20 1443      Program Oxygen Prescription    Program Oxygen Prescription Continuous    Liters per minute 8      Home Oxygen   Home Oxygen Device E-Tanks;Home Concentrator    Sleep Oxygen Prescription Continuous    Liters per minute 3    Home Exercise Oxygen Prescription Continuous    Liters per minute 8    Home Resting Oxygen Prescription Continuous    Liters per minute 3    Compliance with Home Oxygen Use Yes      Goals/Expected Outcomes   Short Term Goals To learn and exhibit compliance with exercise, home and travel O2 prescription;To learn and understand importance of monitoring SPO2 with pulse oximeter and demonstrate accurate use of the pulse oximeter.;To learn and demonstrate proper use of respiratory medications;To learn and demonstrate proper pursed lip breathing techniques or other breathing techniques.;To learn and understand importance of maintaining oxygen saturations>88%    Long  Term Goals Exhibits compliance with exercise, home and travel O2 prescription;Verbalizes importance of monitoring SPO2 with pulse oximeter and return demonstration;Maintenance of O2 saturations>88%;Exhibits proper breathing techniques, such as pursed lip breathing or other method taught during program session;Compliance with respiratory medication;Demonstrates proper use of MDI's    Goals/Expected Outcomes compliance           Initial Exercise Prescription:  Initial Exercise Prescription - 03/14/20 1400      Date of Initial Exercise RX and Referring Provider   Date 03/14/20    Referring Provider Dr. Haroldine Laws    Expected Discharge Date 07/18/20      Oxygen   Oxygen Continuous    Liters 8      NuStep   Level 1    SPM 80    Minutes 39      Prescription Details   Frequency (times per week) 2    Duration Progress to 30 minutes of continuous aerobic without signs/symptoms of physical distress      Intensity   THRR 40-80% of Max Heartrate 61-122    Ratings of Perceived Exertion 11-13    Perceived Dyspnea 0-4      Resistance  Training   Training Prescription Yes    Weight 2 lbs    Reps 10-15           Perform Capillary Blood Glucose checks as needed.  Exercise Prescription Changes:   Exercise Prescription Changes    Row Name 03/18/20 1400  Response to Exercise   Blood Pressure (Admit) 112/70       Blood Pressure (Exercise) 130/82       Blood Pressure (Exit) 102/62       Heart Rate (Admit) 82 bpm       Heart Rate (Exercise) 92 bpm       Heart Rate (Exit) 82 bpm       Oxygen Saturation (Admit) 92 %       Oxygen Saturation (Exercise) 91 %       Oxygen Saturation (Exit) 96 %       Rating of Perceived Exertion (Exercise) 9       Perceived Dyspnea (Exercise) 11       Duration Continue with 30 min of aerobic exercise without signs/symptoms of physical distress.       Intensity THRR unchanged         Progression   Progression Continue to progress workloads to maintain intensity without signs/symptoms of physical distress.         Resistance Training   Training Prescription Yes       Weight 2 lbs       Reps 10-15       Time 10 Minutes         Oxygen   Oxygen Continuous       Liters 8         NuStep   Level 1       SPM 76       Minutes 39       METs 1.9              Exercise Comments:   Exercise Comments    Row Name 03/18/20 1443           Exercise Comments Pt completed his first exercise session of pulmonary rehab today. He was able to tolerate exercise well with no complaints. 8L of continuous oxygen was adequate to maintain his oxygen saturations >90%.              Exercise Goals and Review:   Exercise Goals    Row Name 03/14/20 1453 03/18/20 1446           Exercise Goals   Increase Physical Activity Yes Yes      Intervention Provide advice, education, support and counseling about physical activity/exercise needs.;Develop an individualized exercise prescription for aerobic and resistive training based on initial evaluation findings, risk stratification,  comorbidities and participant's personal goals. Provide advice, education, support and counseling about physical activity/exercise needs.;Develop an individualized exercise prescription for aerobic and resistive training based on initial evaluation findings, risk stratification, comorbidities and participant's personal goals.      Expected Outcomes Short Term: Attend rehab on a regular basis to increase amount of physical activity.;Long Term: Add in home exercise to make exercise part of routine and to increase amount of physical activity.;Long Term: Exercising regularly at least 3-5 days a week. Short Term: Attend rehab on a regular basis to increase amount of physical activity.;Long Term: Add in home exercise to make exercise part of routine and to increase amount of physical activity.;Long Term: Exercising regularly at least 3-5 days a week.      Increase Strength and Stamina Yes Yes      Intervention Provide advice, education, support and counseling about physical activity/exercise needs.;Develop an individualized exercise prescription for aerobic and resistive training based on initial evaluation findings, risk stratification, comorbidities and participant's personal goals. Provide advice, education, support and counseling about  physical activity/exercise needs.;Develop an individualized exercise prescription for aerobic and resistive training based on initial evaluation findings, risk stratification, comorbidities and participant's personal goals.      Expected Outcomes Short Term: Increase workloads from initial exercise prescription for resistance, speed, and METs.;Short Term: Perform resistance training exercises routinely during rehab and add in resistance training at home;Long Term: Improve cardiorespiratory fitness, muscular endurance and strength as measured by increased METs and functional capacity (6MWT) Short Term: Increase workloads from initial exercise prescription for resistance, speed, and  METs.;Short Term: Perform resistance training exercises routinely during rehab and add in resistance training at home;Long Term: Improve cardiorespiratory fitness, muscular endurance and strength as measured by increased METs and functional capacity (6MWT)      Able to understand and use rate of perceived exertion (RPE) scale Yes Yes      Intervention Provide education and explanation on how to use RPE scale Provide education and explanation on how to use RPE scale      Expected Outcomes Short Term: Able to use RPE daily in rehab to express subjective intensity level;Long Term:  Able to use RPE to guide intensity level when exercising independently Short Term: Able to use RPE daily in rehab to express subjective intensity level;Long Term:  Able to use RPE to guide intensity level when exercising independently      Able to understand and use Dyspnea scale Yes Yes      Intervention Provide education and explanation on how to use Dyspnea scale Provide education and explanation on how to use Dyspnea scale      Expected Outcomes Short Term: Able to use Dyspnea scale daily in rehab to express subjective sense of shortness of breath during exertion;Long Term: Able to use Dyspnea scale to guide intensity level when exercising independently Short Term: Able to use Dyspnea scale daily in rehab to express subjective sense of shortness of breath during exertion;Long Term: Able to use Dyspnea scale to guide intensity level when exercising independently      Knowledge and understanding of Target Heart Rate Range (THRR) Yes Yes      Intervention Provide education and explanation of THRR including how the numbers were predicted and where they are located for reference Provide education and explanation of THRR including how the numbers were predicted and where they are located for reference      Expected Outcomes Short Term: Able to state/look up THRR;Long Term: Able to use THRR to govern intensity when exercising  independently;Short Term: Able to use daily as guideline for intensity in rehab Short Term: Able to state/look up THRR;Long Term: Able to use THRR to govern intensity when exercising independently;Short Term: Able to use daily as guideline for intensity in rehab      Understanding of Exercise Prescription Yes Yes      Intervention Provide education, explanation, and written materials on patient's individual exercise prescription Provide education, explanation, and written materials on patient's individual exercise prescription      Expected Outcomes Short Term: Able to explain program exercise prescription;Long Term: Able to explain home exercise prescription to exercise independently Short Term: Able to explain program exercise prescription;Long Term: Able to explain home exercise prescription to exercise independently             Exercise Goals Re-Evaluation :  Exercise Goals Re-Evaluation    Row Name 03/18/20 1446             Exercise Goal Re-Evaluation   Exercise Goals Review Increase Physical Activity;Increase Strength and  Stamina;Able to understand and use rate of perceived exertion (RPE) scale;Able to understand and use Dyspnea scale;Knowledge and understanding of Target Heart Rate Range (THRR);Understanding of Exercise Prescription       Comments Pt has completed 1 exercise session. 8 L of O2 was able to maintain his oxygen saturations >90%. He is eager to be in the program. He exercised at 1.9 METs on his first day. WIll continue to monitor and progress as able.       Expected Outcomes Through exercise at rehab and by engaging in a home exercise program, the patient will be able to reach their goals.              Discharge Exercise Prescription (Final Exercise Prescription Changes):  Exercise Prescription Changes - 03/18/20 1400      Response to Exercise   Blood Pressure (Admit) 112/70    Blood Pressure (Exercise) 130/82    Blood Pressure (Exit) 102/62    Heart Rate (Admit)  82 bpm    Heart Rate (Exercise) 92 bpm    Heart Rate (Exit) 82 bpm    Oxygen Saturation (Admit) 92 %    Oxygen Saturation (Exercise) 91 %    Oxygen Saturation (Exit) 96 %    Rating of Perceived Exertion (Exercise) 9    Perceived Dyspnea (Exercise) 11    Duration Continue with 30 min of aerobic exercise without signs/symptoms of physical distress.    Intensity THRR unchanged      Progression   Progression Continue to progress workloads to maintain intensity without signs/symptoms of physical distress.      Resistance Training   Training Prescription Yes    Weight 2 lbs    Reps 10-15    Time 10 Minutes      Oxygen   Oxygen Continuous    Liters 8      NuStep   Level 1    SPM 76    Minutes 39    METs 1.9           Nutrition:  Target Goals: Understanding of nutrition guidelines, daily intake of sodium 1500mg , cholesterol 200mg , calories 30% from fat and 7% or less from saturated fats, daily to have 5 or more servings of fruits and vegetables.  Biometrics:  Pre Biometrics - 03/14/20 1454      Pre Biometrics   Height 5\' 9"  (1.753 m)    Weight 88.5 kg    Waist Circumference 42 inches    Hip Circumference 44 inches    Waist to Hip Ratio 0.95 %    BMI (Calculated) 28.78    Triceps Skinfold 19 mm    % Body Fat 29.8 %    Grip Strength 36.4 kg    Flexibility 0 in    Single Leg Stand 18.84 seconds            Nutrition Therapy Plan and Nutrition Goals:   Nutrition Assessments:  Nutrition Assessments - 03/14/20 1429      MEDFICTS Scores   Pre Score 36           Nutrition Goals Re-Evaluation:   Nutrition Goals Discharge (Final Nutrition Goals Re-Evaluation):   Psychosocial: Target Goals: Acknowledge presence or absence of significant depression and/or stress, maximize coping skills, provide positive support system. Participant is able to verbalize types and ability to use techniques and skills needed for reducing stress and depression.  Initial Review  & Psychosocial Screening:  Initial Psych Review & Screening - 03/14/20 1310  Initial Review   Current issues with None Identified      Family Dynamics   Good Support System? Yes    Comments Great support system with wife and his brothers      Barriers   Psychosocial barriers to participate in program There are no identifiable barriers or psychosocial needs.      Screening Interventions   Interventions Encouraged to exercise           Quality of Life Scores:  Quality of Life - 03/14/20 1448      Quality of Life   Select Quality of Life      Quality of Life Scores   Health/Function Pre 13.5 %    Socioeconomic Pre 26.25 %    Psych/Spiritual Pre 14.64 %    Family Pre 26.38 %    GLOBAL Pre 17.18 %          Scores of 19 and below usually indicate a poorer quality of life in these areas.  A difference of  2-3 points is a clinically meaningful difference.  A difference of 2-3 points in the total score of the Quality of Life Index has been associated with significant improvement in overall quality of life, self-image, physical symptoms, and general health in studies assessing change in quality of life.   PHQ-9: Recent Review Flowsheet Data    Depression screen [facility] 2/9 03/14/2020   Decreased Interest 0   Down, Depressed, Hopeless 0   PHQ - 2 Score 0   Altered sleeping 0   Tired, decreased energy 1   Change in appetite 1   Feeling bad or failure about yourself  0   Trouble concentrating 0   Moving slowly or fidgety/restless 0   Suicidal thoughts 0   PHQ-9 Score 2   Difficult doing work/chores Not difficult at all     Interpretation of Total Score  Total Score Depression Severity:  1-4 = Minimal depression, 5-9 = Mild depression, 10-14 = Moderate depression, 15-19 = Moderately severe depression, 20-27 = Severe depression   Psychosocial Evaluation and Intervention:   Psychosocial Re-Evaluation:  Psychosocial Re-Evaluation    [place] Name 03/20/20 [phone]              Psychosocial Re-Evaluation   Current issues with None Identified       Comments Patient's initial QOL score was 17.18% and his PHQ-9 score was 2 with no psychosocial issues identified. He has a strong support system and a good positive outlook. Will continue to monitor.       Expected Outcomes Patient will no have any psychosocial issues identified at discharge.       Interventions Encouraged to attend Pulmonary Rehabilitation for the exercise;Relaxation education;Stress management education       Continue Psychosocial Services  No Follow up required              Psychosocial Discharge (Final Psychosocial Re-Evaluation):  Psychosocial Re-Evaluation - 03/20/20 0749      Psychosocial Re-Evaluation   Current issues with None Identified    Comments Patient's initial QOL score was 17.18% and his PHQ-9 score was 2 with no psychosocial issues identified. He has a strong support system and a good positive outlook. Will continue to monitor.    Expected Outcomes Patient will no have any psychosocial issues identified at discharge.    Interventions Encouraged to attend Pulmonary Rehabilitation for the exercise;Relaxation education;Stress management education    Continue Psychosocial Services  No Follow up required  Education: Education Goals: Education classes will be provided on a weekly basis, covering required topics. Participant will state understanding/return demonstration of topics presented.  Learning Barriers/Preferences:  Learning Barriers/Preferences - 03/14/20 1312      Learning Barriers/Preferences   Learning Barriers None    Learning Preferences Skilled Demonstration           Education Topics: How Lungs Work and Diseases: - Discuss the anatomy of the lungs and diseases that can affect the lungs, such as COPD.   Exercise: -Discuss the importance of exercise, FITT principles of exercise, normal and abnormal responses to exercise, and how to exercise  safely.   Environmental Irritants: -Discuss types of environmental irritants and how to limit exposure to environmental irritants.   Meds/Inhalers and oxygen: - Discuss respiratory medications, definition of an inhaler and oxygen, and the proper way to use an inhaler and oxygen.   Energy Saving Techniques: - Discuss methods to conserve energy and decrease shortness of breath when performing activities of daily living.    Bronchial Hygiene / Breathing Techniques: - Discuss breathing mechanics, pursed-lip breathing technique,  proper posture, effective ways to clear airways, and other functional breathing techniques   Cleaning Equipment: - Provides group verbal and written instruction about the health risks of elevated stress, cause of high stress, and healthy ways to reduce stress.   Nutrition I: Fats: - Discuss the types of cholesterol, what cholesterol does to the body, and how cholesterol levels can be controlled.   Nutrition II: Labels: -Discuss the different components of food labels and how to read food labels.   Respiratory Infections: - Discuss the signs and symptoms of respiratory infections, ways to prevent respiratory infections, and the importance of seeking medical treatment when having a respiratory infection.   Stress I: Signs and Symptoms: - Discuss the causes of stress, how stress may lead to anxiety and depression, and ways to limit stress.   Stress II: Relaxation: -Discuss relaxation techniques to limit stress.   Oxygen for Home/Travel: - Discuss how to prepare for travel when on oxygen and proper ways to transport and store oxygen to ensure safety.   Knowledge Questionnaire Score:  Knowledge Questionnaire Score - 03/14/20 1429      Knowledge Questionnaire Score   Pre Score 5/18           Core Components/Risk Factors/Patient Goals at Admission:  Personal Goals and Risk Factors at Admission - 03/14/20 1314      Core Components/Risk  Factors/Patient Goals on Admission    Weight Management Weight Loss;Yes    Admit Weight 193 lb (87.5 kg)    Goal Weight: Short Term 185 lb (83.9 kg)    Expected Outcomes Short Term: Continue to assess and modify interventions until short term weight is achieved;Long Term: Adherence to nutrition and physical activity/exercise program aimed toward attainment of established weight goal;Weight Maintenance: Understanding of the daily nutrition guidelines, which includes 25-35% calories from fat, 7% or less cal from saturated fats, less than 200mg  cholesterol, less than 1.5gm of sodium, & 5 or more servings of fruits and vegetables daily;Weight Loss: Understanding of general recommendations for a balanced deficit meal plan, which promotes 1-2 lb weight loss per week and includes a negative energy balance of [id number] kcal/d;Understanding recommendations for meals to include 15-35% energy as protein, 25-35% energy from fat, 35-60% energy from carbohydrates, less than 200mg  of dietary cholesterol, 20-35 gm of total fiber daily;Understanding of distribution of calorie intake throughout the day with the consumption of 4-5 meals/snacks;Weight Gain:  Understanding of general recommendations for a high calorie, high protein meal plan that promotes weight gain by distributing calorie intake throughout the day with the consumption for 4-5 meals, snacks, and/or supplements    Improve shortness of breath with ADL's Yes    Intervention Provide education, individualized exercise plan and daily activity instruction to help decrease symptoms of SOB with activities of daily living.    Expected Outcomes Short Term: Improve cardiorespiratory fitness to achieve a reduction of symptoms when performing ADLs;Long Term: Be able to perform more ADLs without symptoms or delay the onset of symptoms           Core Components/Risk Factors/Patient Goals Review:   Goals and Risk Factor Review    Row Name 03/20/20 0748              Core Components/Risk Factors/Patient Goals Review   Personal Goals Review Improve shortness of breath with ADL's       Review Patient is new to the program completing 2 sessions. His personal goals are to be able to walk to his mailbox and to get off of O2.  Will continue to monitor for progress as he works toward meeting his personal and program goals.       Expected Outcomes Patient will complete the program meeting both personal and program goals.              Core Components/Risk Factors/Patient Goals at Discharge (Final Review):   Goals and Risk Factor Review - 03/20/20 0748      Core Components/Risk Factors/Patient Goals Review   Personal Goals Review Improve shortness of breath with ADL's    Review Patient is new to the program completing 2 sessions. His personal goals are to be able to walk to his mailbox and to get off of O2.  Will continue to monitor for progress as he works toward meeting his personal and program goals.    Expected Outcomes Patient will complete the program meeting both personal and program goals.           ITP Comments:   Comments: ITP REVIEW Pt is making expected progress toward pulmonary rehab goals after completing 2 sessions. Recommend continued exercise, life style modification, education, and utilization of breathing techniques to increase stamina and strength and decrease shortness of breath with exertion.

## 2020-03-20 NOTE — Progress Notes (Signed)
Daily Session Note  Patient Details  Name: Warren [name] MRN: [id number] Date of Birth: 1950/10/13 Referring Provider:     PULMONARY REHAB OTHER RESP ORIENTATION from 03/14/2020 in [place]  Referring Provider Dr. Haroldine Laws      Encounter Date: 03/20/2020  Check In:  Session Check In - 03/20/20 1409      Check-In   Supervising physician immediately available to respond to emergencies [facility] MD immediately available    Physician(s) Dr. Harl Bowie    Location [facility]    Staff Present Geanie Cooley, RN;Traniece Boffa Kris Mouton, MS, ACSM-CEP, Exercise Physiologist;Debra Wynetta Emery, RN, BSN    Virtual Visit No    Medication changes reported     No    Fall or balance concerns reported    No    Tobacco Cessation No Change    Warm-up and Cool-down Performed on first and last piece of equipment    Resistance Training Performed Yes    VAD Patient? No    PAD/SET Patient? No      Pain Assessment   Currently in Pain? No/denies    Pain Score 0-No pain    Multiple Pain Sites No           Capillary Blood Glucose: No results found for this or any previous visit (from the past 24 hour(s)).    Social History   Tobacco Use  Smoking Status Former Smoker  . Quit date: 05/24/2002  . Years since quitting: 17.8  Smokeless Tobacco Never Used    Goals Met:  Independence with exercise equipment Exercise tolerated well No report of cardiac concerns or symptoms Strength training completed today  Goals Unmet:  Not Applicable  Comments: Service time is from 1315 to 1415    Dr. Kathie Dike is Medical Director for [facility].

## 2020-03-25 ENCOUNTER — Encounter (HOSPITAL_COMMUNITY)
Admission: RE | Admit: 2020-03-25 | Discharge: 2020-03-25 | Disposition: A | Discharge status: Home / Self Care | Payer: Medicare Other | Source: Ambulatory Visit | Attending: Internal Medicine | Admitting: Internal Medicine

## 2020-03-25 ENCOUNTER — Other Ambulatory Visit: Payer: Self-pay

## 2020-03-25 DIAGNOSIS — I272 Pulmonary hypertension, unspecified: Secondary | ICD-10-CM | POA: Diagnosis not present

## 2020-03-25 NOTE — Progress Notes (Signed)
Daily Session Note  Patient Details  Name: Warren [name] MRN: [id number] Date of Birth: 04-01-1951 Referring Provider:     PULMONARY REHAB OTHER RESP ORIENTATION from 03/14/2020 in [place]  Referring Provider Dr. Haroldine Laws      Encounter Date: 03/25/2020  Check In:  Session Check In - 03/25/20 1336      Check-In   Supervising physician immediately available to respond to emergencies [facility] MD immediately available    Physician(s) Branch    Location [facility]    Staff Present Geanie Cooley, RN;Dalton Kris Mouton, MS, ACSM-CEP, Exercise Physiologist    Virtual Visit No    Medication changes reported     No    Fall or balance concerns reported    No    Tobacco Cessation No Change    Warm-up and Cool-down Performed as group-led instruction    Resistance Training Performed Yes    VAD Patient? No    PAD/SET Patient? No      Pain Assessment   Currently in Pain? No/denies    Pain Score 0-No pain    Multiple Pain Sites No           Capillary Blood Glucose: No results found for this or any previous visit (from the past 24 hour(s)).    Social History   Tobacco Use  Smoking Status Former Smoker  . Quit date: 05/24/2002  . Years since quitting: 17.8  Smokeless Tobacco Never Used    Goals Met:  Proper associated with RPD/PD & O2 Sat Independence with exercise equipment Improved SOB with ADL's Using PLB without cueing & demonstrates good technique Exercise tolerated well No report of cardiac concerns or symptoms Strength training completed today  Goals Unmet:  Not Applicable  Comments: check out 14:30   Dr. Kathie Dike is Medical Director for [facility] Pulmonary Rehab.

## 2020-03-27 ENCOUNTER — Encounter (HOSPITAL_COMMUNITY)
Admission: RE | Admit: 2020-03-27 | Discharge: 2020-03-27 | Disposition: A | Discharge status: Home / Self Care | Payer: Medicare Other | Source: Ambulatory Visit | Attending: Internal Medicine | Admitting: Internal Medicine

## 2020-03-27 ENCOUNTER — Other Ambulatory Visit: Payer: Self-pay

## 2020-03-27 DIAGNOSIS — I272 Pulmonary hypertension, unspecified: Secondary | ICD-10-CM

## 2020-03-27 NOTE — Progress Notes (Signed)
Daily Session Note  Patient Details  Name: Warren [name] MRN: [id number] Date of Birth: February 27, 1951 Referring Provider:     PULMONARY REHAB OTHER RESP ORIENTATION from 03/14/2020 in [place]  Referring Provider Dr. Haroldine Laws      Encounter Date: 03/27/2020  Check In:  Session Check In - 03/27/20 1330      Check-In   Supervising physician immediately available to respond to emergencies [facility] MD immediately available    Physician(s) Branch    Location [facility]    Staff Present Geanie Cooley, RN;Dannah Ryles Kris Mouton, MS, ACSM-CEP, Exercise Physiologist    Virtual Visit No    Medication changes reported     No    Fall or balance concerns reported    No    Tobacco Cessation No Change    Warm-up and Cool-down Performed as group-led instruction    Resistance Training Performed Yes    VAD Patient? No    PAD/SET Patient? No      Pain Assessment   Pain Score 0-No pain    Multiple Pain Sites No           Capillary Blood Glucose: No results found for this or any previous visit (from the past 24 hour(s)).    Social History   Tobacco Use  Smoking Status Former Smoker  . Quit date: 05/24/2002  . Years since quitting: 17.8  Smokeless Tobacco Never Used    Goals Met:  Independence with exercise equipment Exercise tolerated well No report of cardiac concerns or symptoms Strength training completed today  Goals Unmet:  Not Applicable  Comments: checkout time is 1430   Dr. Kathie Dike is Medical Director for [facility] Pulmonary Rehab.

## 2020-03-31 ENCOUNTER — Other Ambulatory Visit (HOSPITAL_COMMUNITY): Payer: Self-pay | Admitting: *Deleted

## 2020-03-31 ENCOUNTER — Telehealth (HOSPITAL_COMMUNITY): Payer: Self-pay | Admitting: *Deleted

## 2020-03-31 MED ORDER — POTASSIUM CHLORIDE ER 10 MEQ PO TBCR: 20.0000 meq | EXTENDED_RELEASE_TABLET | Freq: Every day | ORAL | 3 refills | Status: DC

## 2020-03-31 NOTE — Telephone Encounter (Signed)
Absolutely recommended.

## 2020-03-31 NOTE — Telephone Encounter (Signed)
Pt asked if he should take the covid 19 booster shot. I told him that our office is recommending our patients take the booster. Pt said he had a bad reaction with the second dose of the vaccine he had vertigo, low blood pressure, elevated heart rate, and fatigue x1 day. Pt asked that I follow up with Dr. Haroldine Laws about his specific case and see if Dr.Bensimhon recommends the booster.

## 2020-04-01 ENCOUNTER — Other Ambulatory Visit: Payer: Self-pay

## 2020-04-01 ENCOUNTER — Encounter (HOSPITAL_COMMUNITY)
Admission: RE | Admit: 2020-04-01 | Discharge: 2020-04-01 | Disposition: A | Discharge status: Home / Self Care | Payer: Medicare Other | Source: Ambulatory Visit | Attending: Internal Medicine | Admitting: Internal Medicine

## 2020-04-01 VITALS — Wt 191.6 lb

## 2020-04-01 DIAGNOSIS — I272 Pulmonary hypertension, unspecified: Secondary | ICD-10-CM

## 2020-04-01 NOTE — Progress Notes (Signed)
Daily Session Note  Patient Details  Name: Warren [name] MRN: [id number] Date of Birth: [birth year] Referring Provider:     PULMONARY REHAB OTHER RESP ORIENTATION from 03/14/2020 in [place]  Referring Provider Dr. Haroldine Laws      Encounter Date: 04/01/2020  Check In:  Session Check In - 04/01/20 1338      Check-In   Supervising physician immediately available to respond to emergencies [facility] MD immediately available    Physician(s) Domenic Polite    Location [facility]    Staff Present Geanie Cooley, RN;Madison Audria Nine, MS, Exercise Physiologist;Dalton Kris Mouton, MS, ACSM-CEP, Exercise Physiologist    Virtual Visit No    Medication changes reported     No    Fall or balance concerns reported    No    Tobacco Cessation No Change    Warm-up and Cool-down Performed as group-led instruction    Resistance Training Performed Yes    VAD Patient? No    PAD/SET Patient? No      Pain Assessment   Currently in Pain? No/denies    Pain Score 0-No pain    Multiple Pain Sites No           Capillary Blood Glucose: No results found for this or any previous visit (from the past 24 hour(s)).    Social History   Tobacco Use  Smoking Status Former Smoker   Quit date: 05/24/2002   Years since quitting: 17.8  Smokeless Tobacco Never Used    Goals Met:  Proper associated with RPD/PD & O2 Sat Independence with exercise equipment Improved SOB with ADL's Using PLB without cueing & demonstrates good technique Exercise tolerated well No report of cardiac concerns or symptoms Strength training completed today  Goals Unmet:  Not Applicable  Comments: check out @   Dr. Kathie Dike is Medical Director for [facility] Pulmonary Re[facility].

## 2020-04-02 NOTE — Telephone Encounter (Signed)
Pt aware and agreeable.  

## 2020-04-03 ENCOUNTER — Other Ambulatory Visit: Payer: Self-pay

## 2020-04-03 ENCOUNTER — Encounter (HOSPITAL_COMMUNITY)
Admission: RE | Admit: 2020-04-03 | Discharge: 2020-04-03 | Disposition: A | Discharge status: Home / Self Care | Payer: Medicare Other | Source: Ambulatory Visit | Attending: Internal Medicine | Admitting: Internal Medicine

## 2020-04-03 DIAGNOSIS — I272 Pulmonary hypertension, unspecified: Secondary | ICD-10-CM | POA: Diagnosis not present

## 2020-04-03 NOTE — Progress Notes (Signed)
Daily Session Note  Patient Details  Name: JUNE VACHA MRN: [id number] Date of Birth: 1951/01/05 Referring Provider:     PULMONARY REHAB OTHER RESP ORIENTATION from 03/14/2020 in [place]  Referring Provider Dr. Haroldine Laws      Encounter Date: 04/03/2020  Check In:  Session Check In - 04/03/20 1330      Check-In   Supervising physician immediately available to respond to emergencies [facility] MD immediately available    Physician(s) Domenic Polite    Location [facility]    Staff Present Aundra Dubin, RN, BSN;Phyllis Billingsley, RN;Madison Audria Nine, MS, Exercise Physiologist    Virtual Visit No    Medication changes reported     No    Fall or balance concerns reported    No    Tobacco Cessation No Change    Warm-up and Cool-down Performed as group-led instruction    Resistance Training Performed Yes    VAD Patient? No    PAD/SET Patient? No      Pain Assessment   Currently in Pain? No/denies    Pain Score 0-No pain    Multiple Pain Sites No           Capillary Blood Glucose: No results found for this or any previous visit (from the past 24 hour(s)).    Social History   Tobacco Use  Smoking Status Former Smoker  . Quit date: 05/24/2002  . Years since quitting: 17.8  Smokeless Tobacco Never Used    Goals Met:  Proper associated with RPD/PD & O2 Sat Independence with exercise equipment Improved SOB with ADL's Using PLB without cueing & demonstrates good technique Exercise tolerated well No report of cardiac concerns or symptoms Strength training completed today  Goals Unmet:  Not Applicable  Comments: Check out 1430.   Dr. Kathie Dike is Medical Director for [facility] Pulmonary Rehab.

## 2020-04-08 ENCOUNTER — Encounter (HOSPITAL_COMMUNITY)
Admission: RE | Admit: 2020-04-08 | Discharge: 2020-04-08 | Disposition: A | Discharge status: Home / Self Care | Payer: Medicare Other | Source: Ambulatory Visit | Attending: Internal Medicine | Admitting: Internal Medicine

## 2020-04-08 ENCOUNTER — Other Ambulatory Visit: Payer: Self-pay

## 2020-04-08 VITALS — Wt 191.6 lb

## 2020-04-08 DIAGNOSIS — I272 Pulmonary hypertension, unspecified: Secondary | ICD-10-CM

## 2020-04-08 NOTE — Progress Notes (Signed)
Daily Session Note  Patient Details  Name: Warren [name] MRN: [id number] Date of Birth: 02/08/1951 Referring Provider:     PULMONARY REHAB OTHER RESP ORIENTATION from 03/14/2020 in [place]  Referring Provider Dr. Haroldine Laws      Encounter Date: 04/08/2020  Check In:  Session Check In - 04/08/20 1347      Check-In   Supervising physician immediately available to respond to emergencies [facility] immediately available    Physician(s) Dr. Harrington Challenger    Location [facility]    Staff Present Hoy Register, MS, ACSM-CEP, Exercise Physiologist;Madison Audria Nine, MS, Exercise Physiologist;Phyllis Billingsley, RN    Virtual Visit No    Medication changes reported     No    Fall or balance concerns reported    No    Tobacco Cessation No Change    Warm-up and Cool-down Performed as group-led instruction    Resistance Training Performed Yes    VAD Patient? No    PAD/SET Patient? No      Pain Assessment   Currently in Pain? No/denies    Pain Score 0-No pain    Multiple Pain Sites No           Capillary Blood Glucose: No results found for this or any previous visit (from the past 24 hour(s)).    Social History   Tobacco Use  Smoking Status Former Smoker  . Quit date: 05/24/2002  . Years since quitting: 17.8  Smokeless Tobacco Never Used    Goals Met:  Independence with exercise equipment Exercise tolerated well No report of cardiac concerns or symptoms Strength training completed today  Goals Unmet:  Not Applicable  Comments: checkout time is 1430   Dr. Kathie Dike is Medical Director for [facility] Pulmonary Rehab.

## 2020-04-10 ENCOUNTER — Other Ambulatory Visit: Payer: Self-pay

## 2020-04-10 ENCOUNTER — Encounter (HOSPITAL_COMMUNITY)
Admission: RE | Admit: 2020-04-10 | Discharge: 2020-04-10 | Disposition: A | Discharge status: Home / Self Care | Payer: Medicare Other | Source: Ambulatory Visit | Attending: Internal Medicine | Admitting: Internal Medicine

## 2020-04-10 DIAGNOSIS — I272 Pulmonary hypertension, unspecified: Secondary | ICD-10-CM | POA: Diagnosis not present

## 2020-04-10 NOTE — Progress Notes (Signed)
Daily Session Note  Patient Details  Name: Warren [name] MRN: [id number] Date of Birth: 1951/02/24 Referring Provider:     PULMONARY REHAB OTHER RESP ORIENTATION from 03/14/2020 in [place]  Referring Provider Dr. Haroldine Laws      Encounter Date: 04/10/2020  Check In:  Session Check In - 04/10/20 1330      Check-In   Supervising physician immediately available to respond to emergencies [facility] MD immediately available    Physician(s) Dr. Harl Bowie    Location [facility]    Staff Present Hoy Register, MS, ACSM-CEP, Exercise Physiologist;Phyllis Billingsley, Kermit Balo, RN, BSN    Virtual Visit No    Medication changes reported     No    Fall or balance concerns reported    No    Tobacco Cessation No Change    Warm-up and Cool-down Performed as group-led instruction    Resistance Training Performed Yes    VAD Patient? No    PAD/SET Patient? No      Pain Assessment   Currently in Pain? Yes    Pain Score 5     Pain Location Hip    Pain Orientation Right    Pain Descriptors / Indicators Aching    Pain Type Chronic pain    Pain Onset More than a month ago    Pain Frequency Constant    Multiple Pain Sites No           Capillary Blood Glucose: No results found for this or any previous visit (from the past 24 hour(s)).    Social History   Tobacco Use  Smoking Status Former Smoker  . Quit date: 05/24/2002  . Years since quitting: 17.8  Smokeless Tobacco Never Used    Goals Met:  Proper associated with RPD/PD & O2 Sat Independence with exercise equipment Improved SOB with ADL's Using PLB without cueing & demonstrates good technique Exercise tolerated well No report of cardiac concerns or symptoms Strength training completed today  Goals Unmet:  Not Applicable  Comments: Check out 1430.   Dr. Kathie Dike is Medical Director for [facility] Pul[facility] Re[facility].

## 2020-04-10 NOTE — Progress Notes (Signed)
Pulmonary Individual Treatment Plan  Patient Details  Name: Warren [name] MRN: [id number] Date of Birth: [birth year] Referring Provider:     PULMONARY REHAB OTHER RESP ORIENTATION from 03/14/2020 in [place]  Referring Provider Dr. Haroldine Laws      Initial Encounter Date:    [place] from 03/14/2020 in [place]  Date 03/14/20      Visit Diagnosis: Pulmonary hypertension, unspecified ([place])  Patient's Home Medications on Admission:   Current Outpatient Medications:  .  acetaminophen (TYLENOL) 500 MG tablet, Take 1,000 mg by mouth every 6 (six) hours as needed for moderate pain or headache., Disp: , Rfl:  .  allopurinol (ZYLOPRIM) 300 MG tablet, Take 300 mg by mouth daily., Disp: , Rfl:  .  aspirin EC 81 MG tablet, Take 1 tablet (81 mg total) by mouth daily., Disp: 90 tablet, Rfl: 3 .  ezetimibe (ZETIA) 10 MG tablet, Take 10 mg by mouth daily., Disp: , Rfl:  .  Flaxseed, Linseed, (FLAX SEEDS PO), Take 500 mg by mouth daily., Disp: , Rfl:  .  hydrochlorothiazide (HYDRODIURIL) 25 MG tablet, Take 25 mg by mouth daily. , Disp: , Rfl:  .  macitentan (OPSUMIT) 10 MG tablet, Take 1 tablet (10 mg total) by mouth daily., Disp: 30 tablet, Rfl: 11 .  Menthol-Methyl Salicylate (MUSCLE RUB) 10-15 % CREA, Apply 1 application topically as needed for muscle pain., Disp: , Rfl:  .  Omega-3 Fatty Acids (FISH OIL) 1000 MG CAPS, Take 1,000 mg by mouth daily., Disp: , Rfl:  .  OXYGEN, Inhale 2-3 L into the lungs continuous. 2 L at rest and with activity 3 L, Disp: , Rfl:  .  oxymetazoline (AFRIN NASAL SPRAY) 0.05 % nasal spray, Place 2 sprays into both nostrils 2 (two) times daily as needed for congestion., Disp: , Rfl:  .  potassium chloride (KLOR-CON) 10 MEQ tablet, Take 2 tablets (20 mEq total) by mouth daily., Disp: 60 tablet, Rfl: 3 .  rosuvastatin (CRESTOR) 5 MG tablet, Take 1 tablet (5 mg total) by mouth daily., Disp: 30  tablet, Rfl: 3 .  sildenafil (REVATIO) 20 MG tablet, Take 20 mg by mouth 3 (three) times daily., Disp: , Rfl:  No current facility-administered medications for this encounter.  Facility-Administered Medications Ordered in Other Encounters:  .  sodium chloride flush (NS) 0.9 % injection 10 mL, 10 mL, Intravenous, PRN, [name], Cassie Freer, MD, 20 mL at 12/05/19 1125  Past Medical History: Past Medical History:  Diagnosis Date  . Coronary artery disease   . Hyperlipidemia   . Hypertension     Tobacco Use: Social History   Tobacco Use  Smoking Status Former Smoker  . Quit date: 05/24/2002  . Years since quitting: 17.8  Smokeless Tobacco Never Used    Labs: Recent Chemical engineer    Labs for ITP Cardiac and Pulmonary Rehab Latest Ref Rng & Units 11/15/2008 11/16/2008 12/14/2019 12/14/2019 12/14/2019   Cholestrol 0 - 200 mg/dL - 133 - - -   LDLCALC 0 - 99 mg/dL - 68 - - -   HDL >39 mg/dL - 41 - - -   Trlycerides <150 mg/dL - 120 - - -   Hemoglobin A1c % 5.5 - - - -   PHART 7.35 - 7.45 - - 7.421 - -   PCO2ART 32 - 48 mmHg - - 37.7 - -   HCO3 20.0 - 28.0 mmol/L - - 24.5 20.8 27.0   TCO2  22 - 32 mmol/L - - 26 22 28    ACIDBASEDEF 0.0 - 2.0 mmol/L - - - 4.0(H) -   O2SAT % - - 92.0 69.0 71.0      Capillary Blood Glucose: No results found for: GLUCAP   Pulmonary Assessment Scores:  Pulmonary Assessment Scores    Row Name 03/14/20 1428         ADL UCSD   SOB Score total 24       CAT Score   CAT Score 17       mMRC Score   mMRC Score 4           UCSD: Self-administered rating of dyspnea associated with activities of daily living (ADLs) 6-point scale (0 = "not at all" to 5 = "maximal or unable to do because of breathlessness")  Scoring Scores range from 0 to 120.  Minimally important difference is 5 units  CAT: CAT can identify the health impairment of COPD patients and is better correlated with disease progression.  CAT has a scoring range of zero to 40. The  CAT score is classified into four groups of low (less than 10), medium (10 - 20), high (21-30) and very high (31-40) based on the impact level of disease on health status. A CAT score over 10 suggests significant symptoms.  A worsening CAT score could be explained by an exacerbation, poor medication adherence, poor inhaler technique, or progression of COPD or comorbid conditions.  CAT MCID is 2 points  mMRC: mMRC (Modified Medical Research Council) Dyspnea Scale is used to assess the degree of baseline functional disability in patients of respiratory disease due to dyspnea. No minimal important difference is established. A decrease in score of 1 point or greater is considered a positive change.   Pulmonary Function Assessment:  Pulmonary Function Assessment - 03/14/20 1309      Breath   Shortness of Breath Yes;Limiting activity           Exercise Target Goals: Exercise Program Goal: Individual exercise prescription set using results from initial 6 min walk test and THRR while considering  patient's activity barriers and safety.   Exercise Prescription Goal: Initial exercise prescription builds to 30-45 minutes a day of aerobic activity, 2-3 days per week.  Home exercise guidelines will be given to patient during program as part of exercise prescription that the participant will acknowledge.  Activity Barriers & Risk Stratification:  Activity Barriers & Cardiac Risk Stratification - 03/14/20 1302      Activity Barriers & Cardiac Risk Stratification   Activity Barriers Arthritis;Back Problems;Neck/Spine Problems;Joint Problems;Deconditioning;Muscular Weakness;Shortness of Breath    Cardiac Risk Stratification Moderate           6 Minute Walk:  6 Minute Walk    Row Name 03/14/20 1449         6 Minute Walk   Phase Initial     Distance 950 feet     Walk Time 6 minutes     # of Rest Breaks 1     MPH 1.8     METS 2.61     RPE 13     Perceived Dyspnea  15     VO2 Peak 9.13      Symptoms Yes (comment)     Comments Pt stopped for 1 min 15 seconds to allow for SpO2 to increase to >80%     Resting HR 70 bpm     Resting BP 96/64     Resting Oxygen Saturation  96 %     Exercise Oxygen Saturation  during 6 min walk 78 %     Max Ex. HR 115 bpm     Max Ex. BP 144/82     2 Minute Post BP 110/56       Interval HR   1 Minute HR 84     2 Minute HR 114     3 Minute HR 95     4 Minute HR 108     5 Minute HR 115     6 Minute HR 115     2 Minute Post HR 88     Interval Heart Rate? Yes       Interval Oxygen   Interval Oxygen? Yes     Baseline Oxygen Saturation % 96 %     1 Minute Oxygen Saturation % 87 %     1 Minute Liters of Oxygen 4 L     2 Minute Oxygen Saturation % 78 %     2 Minute Liters of Oxygen 6 L     3 Minute Oxygen Saturation % 82 %     3 Minute Liters of Oxygen 8 L     4 Minute Oxygen Saturation % 85 %     4 Minute Liters of Oxygen 8 L     5 Minute Oxygen Saturation % 81 %     5 Minute Liters of Oxygen 8 L     6 Minute Oxygen Saturation % 78 %     6 Minute Liters of Oxygen 8 L     2 Minute Post Oxygen Saturation % 89 %     2 Minute Post Liters of Oxygen 8 L            Oxygen Initial Assessment:  Oxygen Initial Assessment - 03/14/20 1428      Initial 6 min Walk   Oxygen Used Continuous    Liters per minute 8      Program Oxygen Prescription   Program Oxygen Prescription Continuous    Liters per minute 8      Intervention   Short Term Goals To learn and exhibit compliance with exercise, home and travel O2 prescription;To learn and understand importance of monitoring SPO2 with pulse oximeter and demonstrate accurate use of the pulse oximeter.;To learn and demonstrate proper use of respiratory medications;To learn and demonstrate proper pursed lip breathing techniques or other breathing techniques.;To learn and understand importance of maintaining oxygen saturations>88%    Long  Term Goals Exhibits compliance with exercise, home and  travel O2 prescription;Verbalizes importance of monitoring SPO2 with pulse oximeter and return demonstration;Maintenance of O2 saturations>88%;Exhibits proper breathing techniques, such as pursed lip breathing or other method taught during program session;Compliance with respiratory medication;Demonstrates proper use of MDI's           Oxygen Re-Evaluation:  Oxygen Re-Evaluation    Row Name 03/18/20 1443 04/08/20 1542           Program Oxygen Prescription   Program Oxygen Prescription Continuous Continuous      Liters per minute 8 8        Home Oxygen   Home Oxygen Device E-Tanks;Home Concentrator --      Sleep Oxygen Prescription Continuous --      Liters per minute 3 --      Home Exercise Oxygen Prescription Continuous Continuous      Liters per minute 8 8      Home Resting Oxygen Prescription Continuous Continuous  Liters per minute 3 3      Compliance with Home Oxygen Use Yes Yes        Goals/Expected Outcomes   Short Term Goals To learn and exhibit compliance with exercise, home and travel O2 prescription;To learn and understand importance of monitoring SPO2 with pulse oximeter and demonstrate accurate use of the pulse oximeter.;To learn and demonstrate proper use of respiratory medications;To learn and demonstrate proper pursed lip breathing techniques or other breathing techniques.;To learn and understand importance of maintaining oxygen saturations>88% To learn and exhibit compliance with exercise, home and travel O2 prescription;To learn and understand importance of monitoring SPO2 with pulse oximeter and demonstrate accurate use of the pulse oximeter.;To learn and understand importance of maintaining oxygen saturations>88%;To learn and demonstrate proper pursed lip breathing techniques or other breathing techniques.;To learn and demonstrate proper use of respiratory medications      Long  Term Goals Exhibits compliance with exercise, home and travel O2  prescription;Verbalizes importance of monitoring SPO2 with pulse oximeter and return demonstration;Maintenance of O2 saturations>88%;Exhibits proper breathing techniques, such as pursed lip breathing or other method taught during program session;Compliance with respiratory medication;Demonstrates proper use of MDI's Demonstrates proper use of MDI's;Compliance with respiratory medication;Exhibits proper breathing techniques, such as pursed lip breathing or other method taught during program session;Maintenance of O2 saturations>88%;Verbalizes importance of monitoring SPO2 with pulse oximeter and return demonstration;Exhibits compliance with exercise, home and travel O2 prescription      Goals/Expected Outcomes compliance Compliance             Oxygen Discharge (Final Oxygen Re-Evaluation):  Oxygen Re-Evaluation - 04/08/20 1542      Program Oxygen Prescription   Program Oxygen Prescription Continuous    Liters per minute 8      Home Oxygen   Home Exercise Oxygen Prescription Continuous    Liters per minute 8    Home Resting Oxygen Prescription Continuous    Liters per minute 3    Compliance with Home Oxygen Use Yes      Goals/Expected Outcomes   Short Term Goals To learn and exhibit compliance with exercise, home and travel O2 prescription;To learn and understand importance of monitoring SPO2 with pulse oximeter and demonstrate accurate use of the pulse oximeter.;To learn and understand importance of maintaining oxygen saturations>88%;To learn and demonstrate proper pursed lip breathing techniques or other breathing techniques.;To learn and demonstrate proper use of respiratory medications    Long  Term Goals Demonstrates proper use of MDI's;Compliance with respiratory medication;Exhibits proper breathing techniques, such as pursed lip breathing or other method taught during program session;Maintenance of O2 saturations>88%;Verbalizes importance of monitoring SPO2 with pulse oximeter and  return demonstration;Exhibits compliance with exercise, home and travel O2 prescription    Goals/Expected Outcomes Compliance           Initial Exercise Prescription:  Initial Exercise Prescription - 03/14/20 1400      Date of Initial Exercise RX and Referring Provider   Date 03/14/20    Referring Provider Dr. Haroldine Laws    Expected Discharge Date 07/18/20      Oxygen   Oxygen Continuous    Liters 8      NuStep   Level 1    SPM 80    Minutes 39      Prescription Details   Frequency (times per week) 2    Duration Progress to 30 minutes of continuous aerobic without signs/symptoms of physical distress      Intensity   THRR 40-80% of Max Heartrate  61-122    Ratings of Perceived Exertion 11-13    Perceived Dyspnea 0-4      Resistance Training   Training Prescription Yes    Weight 2 lbs    Reps 10-15           Perform Capillary Blood Glucose checks as needed.  Exercise Prescription Changes:   Exercise Prescription Changes    Row Name 03/18/20 1400 04/01/20 1500 04/08/20 1400         Response to Exercise   Blood Pressure (Admit) 112/70 108/84 112/84     Blood Pressure (Exercise) 130/82 128/76 152/70     Blood Pressure (Exit) 102/62 102/70 112/70     Heart Rate (Admit) 82 bpm 95 bpm 92 bpm     Heart Rate (Exercise) 92 bpm 119 bpm 106 bpm     Heart Rate (Exit) 82 bpm 108 bpm 89 bpm     Oxygen Saturation (Admit) 92 % 94 % 92 %     Oxygen Saturation (Exercise) 91 % 86 % 89 %     Oxygen Saturation (Exit) 96 % 97 % 98 %     Rating of Perceived Exertion (Exercise) 9 12 12      Perceived Dyspnea (Exercise) 11 12 12      Duration Continue with 30 min of aerobic exercise without signs/symptoms of physical distress. Continue with 30 min of aerobic exercise without signs/symptoms of physical distress. Continue with 30 min of aerobic exercise without signs/symptoms of physical distress.     Intensity THRR unchanged THRR unchanged THRR unchanged       Progression    Progression Continue to progress workloads to maintain intensity without signs/symptoms of physical distress. Continue to progress workloads to maintain intensity without signs/symptoms of physical distress. Continue to progress workloads to maintain intensity without signs/symptoms of physical distress.       Resistance Training   Training Prescription Yes Yes Yes     Weight 2 lbs 3 lbs 3 lbs     Reps 10-15 10-15 10-15     Time 10 Minutes 10 Minutes 10 Minutes       Oxygen   Oxygen Continuous Continuous Continuous     Liters 8 8 8        NuStep   Level 1 2 2      SPM 76 98 100     Minutes 39 39 39     METs 1.9 2 2.1            Exercise Comments:   Exercise Comments    Row Name 03/18/20 1443           Exercise Comments Pt completed his first exercise session of pulmonary rehab today. He was able to tolerate exercise well with no complaints. 8L of continuous oxygen was adequate to maintain his oxygen saturations >90%.              Exercise Goals and Review:   Exercise Goals    Row Name 03/14/20 1453 03/18/20 1446 04/08/20 1543         Exercise Goals   Increase Physical Activity Yes Yes Yes     Intervention Provide advice, education, support and counseling about physical activity/exercise needs.;Develop an individualized exercise prescription for aerobic and resistive training based on initial evaluation findings, risk stratification, comorbidities and participant's personal goals. Provide advice, education, support and counseling about physical activity/exercise needs.;Develop an individualized exercise prescription for aerobic and resistive training based on initial evaluation findings, risk stratification, comorbidities and participant's personal  goals. Provide advice, education, support and counseling about physical activity/exercise needs.;Develop an individualized exercise prescription for aerobic and resistive training based on initial evaluation findings, risk  stratification, comorbidities and participant's personal goals.     Expected Outcomes Short Term: Attend rehab on a regular basis to increase amount of physical activity.;Long Term: Add in home exercise to make exercise part of routine and to increase amount of physical activity.;Long Term: Exercising regularly at least 3-5 days a week. Short Term: Attend rehab on a regular basis to increase amount of physical activity.;Long Term: Add in home exercise to make exercise part of routine and to increase amount of physical activity.;Long Term: Exercising regularly at least 3-5 days a week. Short Term: Attend rehab on a regular basis to increase amount of physical activity.;Long Term: Add in home exercise to make exercise part of routine and to increase amount of physical activity.;Long Term: Exercising regularly at least 3-5 days a week.     Increase Strength and Stamina Yes Yes Yes     Intervention Provide advice, education, support and counseling about physical activity/exercise needs.;Develop an individualized exercise prescription for aerobic and resistive training based on initial evaluation findings, risk stratification, comorbidities and participant's personal goals. Provide advice, education, support and counseling about physical activity/exercise needs.;Develop an individualized exercise prescription for aerobic and resistive training based on initial evaluation findings, risk stratification, comorbidities and participant's personal goals. Provide advice, education, support and counseling about physical activity/exercise needs.;Develop an individualized exercise prescription for aerobic and resistive training based on initial evaluation findings, risk stratification, comorbidities and participant's personal goals.     Expected Outcomes Short Term: Increase workloads from initial exercise prescription for resistance, speed, and METs.;Short Term: Perform resistance training exercises routinely during rehab and  add in resistance training at home;Long Term: Improve cardiorespiratory fitness, muscular endurance and strength as measured by increased METs and functional capacity (6MWT) Short Term: Increase workloads from initial exercise prescription for resistance, speed, and METs.;Short Term: Perform resistance training exercises routinely during rehab and add in resistance training at home;Long Term: Improve cardiorespiratory fitness, muscular endurance and strength as measured by increased METs and functional capacity (6MWT) Short Term: Increase workloads from initial exercise prescription for resistance, speed, and METs.;Short Term: Perform resistance training exercises routinely during rehab and add in resistance training at home;Long Term: Improve cardiorespiratory fitness, muscular endurance and strength as measured by increased METs and functional capacity (6MWT)     Able to understand and use rate of perceived exertion (RPE) scale Yes Yes Yes     Intervention Provide education and explanation on how to use RPE scale Provide education and explanation on how to use RPE scale Provide education and explanation on how to use RPE scale     Expected Outcomes Short Term: Able to use RPE daily in rehab to express subjective intensity level;Long Term:  Able to use RPE to guide intensity level when exercising independently Short Term: Able to use RPE daily in rehab to express subjective intensity level;Long Term:  Able to use RPE to guide intensity level when exercising independently Short Term: Able to use RPE daily in rehab to express subjective intensity level;Long Term:  Able to use RPE to guide intensity level when exercising independently     Able to understand and use Dyspnea scale Yes Yes Yes     Intervention Provide education and explanation on how to use Dyspnea scale Provide education and explanation on how to use Dyspnea scale Provide education and explanation on how to use  Dyspnea scale     Expected Outcomes  Short Term: Able to use Dyspnea scale daily in rehab to express subjective sense of shortness of breath during exertion;Long Term: Able to use Dyspnea scale to guide intensity level when exercising independently Short Term: Able to use Dyspnea scale daily in rehab to express subjective sense of shortness of breath during exertion;Long Term: Able to use Dyspnea scale to guide intensity level when exercising independently Short Term: Able to use Dyspnea scale daily in rehab to express subjective sense of shortness of breath during exertion;Long Term: Able to use Dyspnea scale to guide intensity level when exercising independently     Knowledge and understanding of Target Heart Rate Range (THRR) Yes Yes Yes     Intervention Provide education and explanation of THRR including how the numbers were predicted and where they are located for reference Provide education and explanation of THRR including how the numbers were predicted and where they are located for reference Provide education and explanation of THRR including how the numbers were predicted and where they are located for reference     Expected Outcomes Short Term: Able to state/look up THRR;Long Term: Able to use THRR to govern intensity when exercising independently;Short Term: Able to use daily as guideline for intensity in rehab Short Term: Able to state/look up THRR;Long Term: Able to use THRR to govern intensity when exercising independently;Short Term: Able to use daily as guideline for intensity in rehab Short Term: Able to state/look up THRR;Short Term: Able to use daily as guideline for intensity in rehab;Long Term: Able to use THRR to govern intensity when exercising independently     Understanding of Exercise Prescription Yes Yes Yes     Intervention Provide education, explanation, and written materials on patient's individual exercise prescription Provide education, explanation, and written materials on patient's individual exercise  prescription Provide education, explanation, and written materials on patient's individual exercise prescription     Expected Outcomes Short Term: Able to explain program exercise prescription;Long Term: Able to explain home exercise prescription to exercise independently Short Term: Able to explain program exercise prescription;Long Term: Able to explain home exercise prescription to exercise independently Short Term: Able to explain program exercise prescription;Long Term: Able to explain home exercise prescription to exercise independently            Exercise Goals Re-Evaluation :  Exercise Goals Re-Evaluation    Row Name 03/18/20 1446 04/08/20 1543           Exercise Goal Re-Evaluation   Exercise Goals Review Increase Physical Activity;Increase Strength and Stamina;Able to understand and use rate of perceived exertion (RPE) scale;Able to understand and use Dyspnea scale;Knowledge and understanding of Target Heart Rate Range (THRR);Understanding of Exercise Prescription Increase Physical Activity;Increase Strength and Stamina;Able to understand and use rate of perceived exertion (RPE) scale;Able to understand and use Dyspnea scale;Knowledge and understanding of Target Heart Rate Range (THRR);Understanding of Exercise Prescription      Comments Pt has completed 1 exercise session. 8 L of O2 was able to maintain his oxygen saturations >90%. He is eager to be in the program. He exercised at 1.9 METs on his first day. WIll continue to monitor and progress as able. Pt has completed 7 exercise sessions. He is progressing well and is going to try the treadmill next week. He feels he is getting stronger. He is exercising at 2.1 METs on the stepper. Will continue to monitor and progress exercise as able.      Expected Outcomes  Through exercise at rehab and by engaging in a home exercise program, the patient will be able to reach their goals. Through exercising at rehab and at home patient will reach their  goals.             Discharge Exercise Prescription (Final Exercise Prescription Changes):  Exercise Prescription Changes - 04/08/20 1400      Response to Exercise   Blood Pressure (Admit) 112/84    Blood Pressure (Exercise) 152/70    Blood Pressure (Exit) 112/70    Heart Rate (Admit) 92 bpm    Heart Rate (Exercise) 106 bpm    Heart Rate (Exit) 89 bpm    Oxygen Saturation (Admit) 92 %    Oxygen Saturation (Exercise) 89 %    Oxygen Saturation (Exit) 98 %    Rating of Perceived Exertion (Exercise) 12    Perceived Dyspnea (Exercise) 12    Duration Continue with 30 min of aerobic exercise without signs/symptoms of physical distress.    Intensity THRR unchanged      Progression   Progression Continue to progress workloads to maintain intensity without signs/symptoms of physical distress.      Resistance Training   Training Prescription Yes    Weight 3 lbs    Reps 10-15    Time 10 Minutes      Oxygen   Oxygen Continuous    Liters 8      NuStep   Level 2    SPM 100    Minutes 39    METs 2.1           Nutrition:  Target Goals: Understanding of nutrition guidelines, daily intake of sodium 1500mg , cholesterol 200mg , calories 30% from fat and 7% or less from saturated fats, daily to have 5 or more servings of fruits and vegetables.  Biometrics:  Pre Biometrics - 04/08/20 1456      Pre Biometrics   Weight 86.9 kg    BMI (Calculated) 28.28            Nutrition Therapy Plan and Nutrition Goals:  Nutrition Therapy & Goals - 04/07/20 1331      Personal Nutrition Goals   Comments Patient scored 36 on his medficts diet assessment. We continue to provide heart healthy nutritional information through hand-outs.      Intervention Plan   Intervention Nutrition handout(s) given to patient.           Nutrition Assessments:  Nutrition Assessments - 03/14/20 1429      MEDFICTS Scores   Pre Score 36          MEDIFICTS Score Key:  ?70 Need to make dietary  changes   40-70 Heart Healthy Diet  ? 40 Therapeutic Level Cholesterol Diet   Picture Your Plate Scores:  <29 Unhealthy dietary pattern with much room for improvement.  41-50 Dietary pattern unlikely to meet recommendations for good health and room for improvement.  51-60 More healthful dietary pattern, with some room for improvement.   >60 Healthy dietary pattern, although there may be some specific behaviors that could be improved.    Nutrition Goals Re-Evaluation:   Nutrition Goals Discharge (Final Nutrition Goals Re-Evaluation):   Psychosocial: Target Goals: Acknowledge presence or absence of significant depression and/or stress, maximize coping skills, provide positive support system. Participant is able to verbalize types and ability to use techniques and skills needed for reducing stress and depression.  Initial Review & Psychosocial Screening:  Initial Psych Review & Screening - 03/14/20 1310  Initial Review   Current issues with None Identified      Family Dynamics   Good Support System? Yes    Comments Great support system with wife and his brothers      Barriers   Psychosocial barriers to participate in program There are no identifiable barriers or psychosocial needs.      Screening Interventions   Interventions Encouraged to exercise           Quality of Life Scores:  Quality of Life - 03/14/20 1448      Quality of Life   Select Quality of Life      Quality of Life Scores   Health/Function Pre 13.5 %    Socioeconomic Pre 26.25 %    Psych/Spiritual Pre 14.64 %    Family Pre 26.38 %    GLOBAL Pre 17.18 %          Scores of 19 and below usually indicate a poorer quality of life in these areas.  A difference of  2-3 points is a clinically meaningful difference.  A difference of 2-3 points in the total score of the Quality of Life Index has been associated with significant improvement in overall quality of life, self-image, physical symptoms,  and general health in studies assessing change in quality of life.   PHQ-9: Recent Review Flowsheet Data    Depression screen [facility] 2/9 03/14/2020   Decreased Interest 0   Down, Depressed, Hopeless 0   PHQ - 2 Score 0   Altered sleeping 0   Tired, decreased energy 1   Change in appetite 1   Feeling bad or failure about yourself  0   Trouble concentrating 0   Moving slowly or fidgety/restless 0   Suicidal thoughts 0   PHQ-9 Score 2   Difficult doing work/chores Not difficult at all     Interpretation of Total Score  Total Score Depression Severity:  1-4 = Minimal depression, 5-9 = Mild depression, 10-14 = Moderate depression, 15-19 = Moderately severe depression, 20-27 = Severe depression   Psychosocial Evaluation and Intervention:   Psychosocial Re-Evaluation:  Psychosocial Re-Evaluation    Row Name 03/20/20 0749 04/07/20 1331           Psychosocial Re-Evaluation   Current issues with None Identified --      Comments Patient's initial QOL score was 17.18% and his PHQ-9 score was 2 with no psychosocial issues identified. He has a strong support system and a good positive outlook. Will continue to monitor. Patient continues to have no psychosocial issues identified. He has a positive attitude and works hard during his sessions. He continues to use O2 but says he is starting to feel stronger. Will continue to monitor for progress.      Expected Outcomes Patient will no have any psychosocial issues identified at discharge. Patient will no have any psychosocial issues identified at discharge.      Interventions Encouraged to attend Pulmonary Rehabilitation for the exercise;Relaxation education;Stress management education Encouraged to attend Pulmonary Rehabilitation for the exercise;Relaxation education;Stress management education      Continue Psychosocial Services  No Follow up required No Follow up required             Psychosocial Discharge (Final Psychosocial  Re-Evaluation):  Psychosocial Re-Evaluation - 04/07/20 1331      Psychosocial Re-Evaluation   Comments Patient continues to have no psychosocial issues identified. He has a positive attitude and works hard during his sessions. He continues to use O2 but  says he is starting to feel stronger. Will continue to monitor for progress.    Expected Outcomes Patient will no have any psychosocial issues identified at discharge.    Interventions Encouraged to attend Pulmonary Rehabilitation for the exercise;Relaxation education;Stress management education    Continue Psychosocial Services  No Follow up required            Education: Education Goals: Education classes will be provided on a weekly basis, covering required topics. Participant will state understanding/return demonstration of topics presented.  Learning Barriers/Preferences:  Learning Barriers/Preferences - 03/14/20 1312      Learning Barriers/Preferences   Learning Barriers None    Learning Preferences Skilled Demonstration           Education Topics: How Lungs Work and Diseases: - Discuss the anatomy of the lungs and diseases that can affect the lungs, such as COPD.   Exercise: -Discuss the importance of exercise, FITT principles of exercise, normal and abnormal responses to exercise, and how to exercise safely.   Environmental Irritants: -Discuss types of environmental irritants and how to limit exposure to environmental irritants.   Meds/Inhalers and oxygen: - Discuss respiratory medications, definition of an inhaler and oxygen, and the proper way to use an inhaler and oxygen.   Energy Saving Techniques: - Discuss methods to conserve energy and decrease shortness of breath when performing activities of daily living.    PULMONARY REHAB OTHER RESPIRATORY from 04/03/2020 in [place]  Date 03/20/20  Educator DF  Instruction Review Code 2- Demonstrated Understanding      Bronchial Hygiene  / Breathing Techniques: - Discuss breathing mechanics, pursed-lip breathing technique,  proper posture, effective ways to clear airways, and other functional breathing techniques   PULMONARY REHAB OTHER RESPIRATORY from 04/03/2020 in [place]  Date 03/27/20  Educator DF  Instruction Review Code 2- Demonstrated Understanding      Cleaning Equipment: - Provides group verbal and written instruction about the health risks of elevated stress, cause of high stress, and healthy ways to reduce stress.   PULMONARY REHAB OTHER RESPIRATORY from 04/03/2020 in [place]  Date 04/03/20  Educator DJ  Instruction Review Code 1- Verbalizes Understanding      Nutrition I: Fats: - Discuss the types of cholesterol, what cholesterol does to the body, and how cholesterol levels can be controlled.   Nutrition II: Labels: -Discuss the different components of food labels and how to read food labels.   Respiratory Infections: - Discuss the signs and symptoms of respiratory infections, ways to prevent respiratory infections, and the importance of seeking medical treatment when having a respiratory infection.   Stress I: Signs and Symptoms: - Discuss the causes of stress, how stress may lead to anxiety and depression, and ways to limit stress.   Stress II: Relaxation: -Discuss relaxation techniques to limit stress.   Oxygen for Home/Travel: - Discuss how to prepare for travel when on oxygen and proper ways to transport and store oxygen to ensure safety.   Knowledge Questionnaire Score:  Knowledge Questionnaire Score - 03/14/20 1429      Knowledge Questionnaire Score   Pre Score 5/18           Core Components/Risk Factors/Patient Goals at Admission:  Personal Goals and Risk Factors at Admission - 03/14/20 1314      Core Components/Risk Factors/Patient Goals on Admission    Weight Management Weight Loss;Yes    Admit Weight 193 lb (87.5 kg)     Goal  Weight: Short Term 185 lb (83.9 kg)    Expected Outcomes Short Term: Continue to assess and modify interventions until short term weight is achieved;Long Term: Adherence to nutrition and physical activity/exercise program aimed toward attainment of established weight goal;Weight Maintenance: Understanding of the daily nutrition guidelines, which includes 25-35% calories from fat, 7% or less cal from saturated fats, less than 200mg  cholesterol, less than 1.5gm of sodium, & 5 or more servings of fruits and vegetables daily;Weight Loss: Understanding of general recommendations for a balanced deficit meal plan, which promotes 1-2 lb weight loss per week and includes a negative energy balance of [phone] kcal/d;Understanding recommendations for meals to include 15-35% energy as protein, 25-35% energy from fat, 35-60% energy from carbohydrates, less than 200mg  of dietary cholesterol, 20-35 gm of total fiber daily;Understanding of distribution of calorie intake throughout the day with the consumption of 4-5 meals/snacks;Weight Gain: Understanding of general recommendations for a high calorie, high protein meal plan that promotes weight gain by distributing calorie intake throughout the day with the consumption for 4-5 meals, snacks, and/or supplements    Improve shortness of breath with ADL's Yes    Intervention Provide education, individualized exercise plan and daily activity instruction to help decrease symptoms of SOB with activities of daily living.    Expected Outcomes Short Term: Improve cardiorespiratory fitness to achieve a reduction of symptoms when performing ADLs;Long Term: Be able to perform more ADLs without symptoms or delay the onset of symptoms           Core Components/Risk Factors/Patient Goals Review:   Goals and Risk Factor Review    Row Name 03/20/20 0748 04/07/20 1333           Core Components/Risk Factors/Patient Goals Review   Personal Goals Review Improve shortness of  breath with ADL's --      Review Patient is new to the program completing 2 sessions. His personal goals are to be able to walk to his mailbox and to get off of O2.  Will continue to monitor for progress as he works toward meeting his personal and program goals. Patient has completed 6 sessions losing 1 lb since last 30 day review. He is doing well in the program with progression and consistent attendance. His personal goals are to breathe better; to be able to walk to his mailbox and to get off of O2. He feels he is making progress toward meeting his goals.      Expected Outcomes Patient will complete the program meeting both personal and program goals. Patient will complete the program meeting both personal and program goals.             Core Components/Risk Factors/Patient Goals at Discharge (Final Review):   Goals and Risk Factor Review - 04/07/20 1333      Core Components/Risk Factors/Patient Goals Review   Review Patient has completed 6 sessions losing 1 lb since last 30 day review. He is doing well in the program with progression and consistent attendance. His personal goals are to breathe better; to be able to walk to his mailbox and to get off of O2. He feels he is making progress toward meeting his goals.    Expected Outcomes Patient will complete the program meeting both personal and program goals.           ITP Comments:   Comments: ITP REVIEW Pt is making expected progress toward pulmonary rehab goals after completing 8 sessions. Recommend continued exercise, life style modification, education, and utilization  of breathing techniques to increase stamina and strength and decrease shortness of breath with exertion.

## 2020-04-15 ENCOUNTER — Other Ambulatory Visit: Payer: Self-pay

## 2020-04-15 ENCOUNTER — Encounter (HOSPITAL_COMMUNITY)
Admission: RE | Admit: 2020-04-15 | Discharge: 2020-04-15 | Disposition: A | Discharge status: Home / Self Care | Payer: Medicare Other | Source: Ambulatory Visit | Attending: Internal Medicine | Admitting: Internal Medicine

## 2020-04-15 DIAGNOSIS — Z23 Encounter for immunization: Secondary | ICD-10-CM | POA: Diagnosis not present

## 2020-04-15 DIAGNOSIS — I272 Pulmonary hypertension, unspecified: Secondary | ICD-10-CM | POA: Diagnosis not present

## 2020-04-15 NOTE — Progress Notes (Signed)
Daily Session Note  Patient Details  Name: Warren [name] MRN: [id number] Date of Birth: Oct 20, 1950 Referring Provider:     PULMONARY REHAB OTHER RESP ORIENTATION from 03/14/2020 in [place]  Referring Provider Dr. Haroldine Laws      Encounter Date: 04/15/2020  Check In:  Session Check In - 04/15/20 1330      Check-In   Supervising physician immediately available to respond to emergencies [facility] MD immediately available    Physician(s) Dr. Johnsie Cancel    Location [facility]    Staff Present Geanie Cooley, RN;Madison Audria Nine, MS, Exercise Physiologist;Dalton Kris Mouton, MS, ACSM-CEP, Exercise Physiologist    Virtual Visit No    Medication changes reported     No    Fall or balance concerns reported    No    Tobacco Cessation No Change    Warm-up and Cool-down Performed as group-led instruction    Resistance Training Performed Yes    VAD Patient? No    PAD/SET Patient? No      Pain Assessment   Currently in Pain? No/denies    Pain Score 0-No pain    Multiple Pain Sites No           Capillary Blood Glucose: No results found for this or any previous visit (from the past 24 hour(s)).    Social History   Tobacco Use  Smoking Status Former Smoker   Quit date: 05/24/2002   Years since quitting: 17.9  Smokeless Tobacco Never Used    Goals Met:  Proper associated with RPD/PD & O2 Sat Independence with exercise equipment Improved SOB with ADL's Using PLB without cueing & demonstrates good technique Exercise tolerated well No report of cardiac concerns or symptoms Strength training completed today  Goals Unmet:  Not Applicable  Comments: check out @ 14:30   Dr. Kathie Dike is Medical Director for [facility] Pulmonary Rehab.

## 2020-04-17 ENCOUNTER — Encounter (HOSPITAL_COMMUNITY): Payer: Medicare Other

## 2020-04-21 ENCOUNTER — Encounter (HOSPITAL_COMMUNITY): Payer: Medicare Other

## 2020-04-22 ENCOUNTER — Encounter (HOSPITAL_COMMUNITY)
Admission: RE | Admit: 2020-04-22 | Discharge: 2020-04-22 | Disposition: A | Discharge status: Home / Self Care | Payer: Medicare Other | Source: Ambulatory Visit | Attending: Internal Medicine | Admitting: Internal Medicine

## 2020-04-22 ENCOUNTER — Other Ambulatory Visit: Payer: Self-pay

## 2020-04-22 VITALS — Wt 192.7 lb

## 2020-04-22 DIAGNOSIS — I272 Pulmonary hypertension, unspecified: Secondary | ICD-10-CM | POA: Diagnosis not present

## 2020-04-22 NOTE — Progress Notes (Signed)
Daily Session Note  Patient Details  Name: Warren [name] MRN: [id number] Date of Birth: 02/29/1951 Referring Provider:     PULMONARY REHAB OTHER RESP ORIENTATION from 03/14/2020 in [place]  Referring Provider Dr. Haroldine Laws      Encounter Date: 04/22/2020  Check In:  Session Check In - 04/22/20 1330      Check-In   Supervising physician immediately available to respond to emergencies [facility] MD immediately available    Physician(s) Dr. Domenic Polite    Location [facility]    Staff Present Geanie Cooley, RN;Dalton Kris Mouton, MS, ACSM-CEP, Exercise Physiologist    Virtual Visit No    Medication changes reported     No    Fall or balance concerns reported    No    Tobacco Cessation No Change    Warm-up and Cool-down Performed as group-led instruction    Resistance Training Performed Yes    VAD Patient? No    PAD/SET Patient? No      Pain Assessment   Currently in Pain? Yes    Pain Score 5     Pain Location Hip    Pain Orientation Right    Pain Descriptors / Indicators Aching;Constant    Pain Type Chronic pain    Pain Onset More than a month ago    Pain Frequency Constant    Multiple Pain Sites No           Capillary Blood Glucose: No results found for this or any previous visit (from the past 24 hour(s)).    Social History   Tobacco Use  Smoking Status Former Smoker  . Quit date: 05/24/2002  . Years since quitting: 17.9  Smokeless Tobacco Never Used    Goals Met:  Proper associated with RPD/PD & O2 Sat Independence with exercise equipment Improved SOB with ADL's Using PLB without cueing & demonstrates good technique Exercise tolerated well No report of cardiac concerns or symptoms Strength training completed today  Goals Unmet:  Not Applicable  Comments: check out @ 14:30   Dr. Kathie Dike is Medical Director for [facility] Pulmonary Rehab.

## 2020-04-24 ENCOUNTER — Encounter (HOSPITAL_COMMUNITY)
Admission: RE | Admit: 2020-04-24 | Discharge: 2020-04-24 | Disposition: A | Discharge status: Home / Self Care | Payer: Medicare Other | Source: Ambulatory Visit | Attending: Internal Medicine | Admitting: Internal Medicine

## 2020-04-24 ENCOUNTER — Other Ambulatory Visit: Payer: Self-pay

## 2020-04-24 DIAGNOSIS — I272 Pulmonary hypertension, unspecified: Secondary | ICD-10-CM | POA: Diagnosis not present

## 2020-04-24 NOTE — Progress Notes (Signed)
Daily Session Note  Patient Details  Name: Warren [name] MRN: [id number] Date of Birth: June 20, 1950 Referring Provider:     PULMONARY REHAB OTHER RESP ORIENTATION from 03/14/2020 in [place]  Referring Provider Dr. Haroldine Laws      Encounter Date: 04/24/2020  Check In:  Session Check In - 04/24/20 1330      Check-In   Supervising physician immediately available to respond to emergencies [facility] MD immediately available    Physician(s) Dr. Domenic Polite    Location [facility]    Staff Present Hoy Register, MS, ACSM-CEP, Exercise Physiologist;Inocente Krach Wynetta Emery, RN, BSN;Phyllis Billingsley, RN;Madison Audria Nine, MS, Exercise Physiologist    Virtual Visit No    Medication changes reported     No    Fall or balance concerns reported    No    Tobacco Cessation No Change    Warm-up and Cool-down Performed as group-led instruction    Resistance Training Performed Yes    VAD Patient? No    PAD/SET Patient? No      Pain Assessment   Currently in Pain? No/denies    Pain Score 0-No pain    Multiple Pain Sites No           Capillary Blood Glucose: No results found for this or any previous visit (from the past 24 hour(s)).    Social History   Tobacco Use  Smoking Status Former Smoker  . Quit date: 05/24/2002  . Years since quitting: 17.9  Smokeless Tobacco Never Used    Goals Met:  Proper associated with RPD/PD & O2 Sat Independence with exercise equipment Improved SOB with ADL's Using PLB without cueing & demonstrates good technique Exercise tolerated well No report of cardiac concerns or symptoms Strength training completed today  Goals Unmet:  Not Applicable  Comments: Check out 1430.   Dr. Kathie Dike is Medical Director for [facility] Pulmonary Rehab.

## 2020-04-29 ENCOUNTER — Encounter (HOSPITAL_COMMUNITY)
Admission: RE | Admit: 2020-04-29 | Discharge: 2020-04-29 | Disposition: A | Discharge status: Home / Self Care | Payer: Medicare Other | Source: Ambulatory Visit | Attending: Internal Medicine | Admitting: Internal Medicine

## 2020-04-29 ENCOUNTER — Other Ambulatory Visit: Payer: Self-pay

## 2020-04-29 DIAGNOSIS — I272 Pulmonary hypertension, unspecified: Secondary | ICD-10-CM

## 2020-04-29 NOTE — Progress Notes (Signed)
Daily Session Note  Patient Details  Name: Warren [name] MRN: [id number] Date of Birth: [birth year] Referring Provider:     PULMONARY REHAB OTHER RESP ORIENTATION from 03/14/2020 in [place]  Referring Provider Dr. Haroldine Laws      Encounter Date: 04/29/2020  Check In:  Session Check In - 04/29/20 1330      Check-In   Supervising physician immediately available to respond to emergencies [facility] MD immediately available    Physician(s) Dr. Domenic Polite    Location [facility]    Staff Present Geanie Cooley, RN;Candus Braud Audria Nine, MS, Exercise Physiologist;Dalton Kris Mouton, MS, ACSM-CEP, Exercise Physiologist    Virtual Visit No    Medication changes reported     No    Fall or balance concerns reported    No    Tobacco Cessation No Change    Warm-up and Cool-down Performed as group-led instruction    Resistance Training Performed Yes    VAD Patient? No    PAD/SET Patient? No      Pain Assessment   Currently in Pain? No/denies    Multiple Pain Sites No           Capillary Blood Glucose: No results found for this or any previous visit (from the past 24 hour(s)).    Social History   Tobacco Use  Smoking Status Former Smoker  . Quit date: 05/24/2002  . Years since quitting: 17.9  Smokeless Tobacco Never Used    Goals Met:  Independence with exercise equipment Exercise tolerated well No report of cardiac concerns or symptoms Strength training completed today  Goals Unmet:  Not Applicable  Comments: check out 1430   Dr. Kathie Dike is Medical Director for [facility] Pulmonary Rehab.

## 2020-05-01 ENCOUNTER — Other Ambulatory Visit: Payer: Self-pay

## 2020-05-01 ENCOUNTER — Ambulatory Visit (HOSPITAL_BASED_OUTPATIENT_CLINIC_OR_DEPARTMENT_OTHER)
Admission: RE | Admit: 2020-05-01 | Discharge: 2020-05-01 | Disposition: A | Discharge status: Home / Self Care | Payer: Medicare Other | Source: Ambulatory Visit | Attending: Internal Medicine | Admitting: Internal Medicine

## 2020-05-01 ENCOUNTER — Encounter (HOSPITAL_COMMUNITY): Payer: Medicare Other

## 2020-05-01 ENCOUNTER — Ambulatory Visit (HOSPITAL_COMMUNITY)
Admission: RE | Admit: 2020-05-01 | Discharge: 2020-05-01 | Disposition: A | Discharge status: Home / Self Care | Payer: Medicare Other | Source: Ambulatory Visit | Attending: Internal Medicine | Admitting: Internal Medicine

## 2020-05-01 ENCOUNTER — Encounter (HOSPITAL_COMMUNITY): Payer: Self-pay | Admitting: Internal Medicine

## 2020-05-01 VITALS — BP 158/90 | HR 73 | Wt 193.2 lb

## 2020-05-01 DIAGNOSIS — I110 Hypertensive heart disease with heart failure: Secondary | ICD-10-CM | POA: Diagnosis not present

## 2020-05-01 DIAGNOSIS — I5022 Chronic systolic (congestive) heart failure: Secondary | ICD-10-CM | POA: Insufficient documentation

## 2020-05-01 DIAGNOSIS — I272 Pulmonary hypertension, unspecified: Secondary | ICD-10-CM

## 2020-05-01 DIAGNOSIS — I358 Other nonrheumatic aortic valve disorders: Secondary | ICD-10-CM | POA: Diagnosis not present

## 2020-05-01 DIAGNOSIS — J9611 Chronic respiratory failure with hypoxia: Secondary | ICD-10-CM

## 2020-05-01 DIAGNOSIS — E785 Hyperlipidemia, unspecified: Secondary | ICD-10-CM | POA: Insufficient documentation

## 2020-05-01 HISTORY — DX: Heart failure, unspecified: I50.9

## 2020-05-01 LAB — ECHOCARDIOGRAM COMPLETE
Area-P 1/2: 5.66 cm2
S' Lateral: 2.9 cm

## 2020-05-01 MED ORDER — SILDENAFIL CITRATE 20 MG PO TABS
40.0000 mg | ORAL_TABLET | Freq: Three times a day (TID) | ORAL | 6 refills | Status: DC
Start: 2020-05-01 — End: 2020-05-09

## 2020-05-01 NOTE — Progress Notes (Signed)
ITAMAR home sleep study given to patient, all instructions explained, and CLOUDPAT registration complete.  

## 2020-05-01 NOTE — Addendum Note (Signed)
Encounter addended by: Scarlette Calico, RN on: 05/01/2020 12:54 PM  Actions taken: Pharmacy for encounter modified, Order list changed, Clinical Note Signed

## 2020-05-01 NOTE — Addendum Note (Signed)
Encounter addended by: Scarlette Calico, RN on: 05/01/2020 1:06 PM  Actions taken: Clinical Note Signed

## 2020-05-01 NOTE — Patient Instructions (Addendum)
Increase Sildenafil to 40 mg Three times a day   Your provider has recommended that you have a home sleep study.  We have provided you with the equipment in our office today. Please download the app and follow the instructions. YOUR PIN NUMBER IS: [id number]. Once you have completed the test you just dispose of the equipment, the information is automatically uploaded to [place] via blue-tooth technology. If your test is positive for sleep apnea and you need a home CPAP machine you will be contacted by Dr Theodosia Blender office [facility]) to set this up.  Your physician recommends that you schedule a follow-up appointment in: 3 months  If you have any questions or concerns before your next appointment please send [place] a message through [place] or call our office at [phone].    TO LEAVE A MESSAGE FOR THE NURSE SELECT OPTION 2, PLEASE LEAVE A MESSAGE INCLUDING: . YOUR NAME . DATE OF BIRTH . CALL BACK NUMBER . REASON FOR CALL**this is important as we prioritize the call backs  [place] AS LONG AS YOU CALL BEFORE 4:00 PM  At the [facility], you and your health needs are our priority. As part of our continuing mission to provide you with exceptional heart care, we have created designated Provider Care Teams. These Care Teams include your primary Cardiologist (physician) and Advanced Practice Providers (APPs- Physician Assistants and Nurse Practitioners) who all work together to provide you with the care you need, when you need it.   You may see any of the following providers on your designated Care Team at your next follow up: Marland Kitchen Dr Glori Bickers . Dr Loralie Champagne . Darrick Grinder, NP . Lyda Jester, PA . Audry Riles, PharmD   Please be sure to bring in all your medications bottles to every appointment.

## 2020-05-01 NOTE — Progress Notes (Signed)
  Echocardiogram 2D Echocardiogram with 3D has been performed.  Warren [name] M 05/01/2020, 10:58 AM

## 2020-05-01 NOTE — Addendum Note (Signed)
Encounter addended by: Scarlette Calico, RN on: 05/01/2020 12:55 PM  Actions taken: Clinical Note Signed

## 2020-05-01 NOTE — Progress Notes (Signed)
Advanced [facility] Failure Clinic Note   Date:  05/01/2020   ID:  Warren [name], DOB 08-24-1950, MRN [id number]  Location: Home  Provider location: [facility] Type of Visit: Established patient  PCP:  [name], MD  Cardiologist:  [name], MD Primary HF: [name]  Chief Complaint: Heart Failure follow-up   History of Present Illness:  Warren [name] a 69 y.o.malewith a hx of HTN, HLD, pulmonary hypertension referred by Dr. [name] for further management of Warren [name].   He has had a very complete w/u by Dr. [name]. He has had rather rapid onset of SOB associated with severe diffusion defect on PFTs with normal spirometry. CT negative for ILD. Echo showed normal EF with moderate to severe pHTN. VQ scan was negative. His basic rheumatologic work-up was negative.   Underwent R/L cath 12/14/19 as below and started on Opsumit.   He is here for routine f/u. Feeling much better. Now tolerating sildenafil well without joint aches. Wearing O2. Able to do all ADLs without too much problem. Can go to mail box and up a flight of steps. No edema, orthopnea or PND. Going to Pulmonary Rehab   Echo today 05/01/20 EF 50-55% RV mildly dilated mild HK RVSP ~71mmHG  PAH therapy 1. Macitentan 10mg   Started 8/21 2. Sildenafil 20 tid Started 10/21  Problem List/Studies 1.Moderate to severe pulmonary HTN -RVSP 44 -> 68 (2020->2021) -PFT 08/31/2019: no obstruction, severe diffusion defect (DCLO 25%) -CT negative for ILD -ANA negative, anti-CCP negative, anti-RA negative -VQ scan negative CTEPH 2.HLD -T chol 140, HDL 40, TG 82, LDL 78 3. HTN -A1c 5.9 4.CAD -Three-vessel calcification seen on recent lung CT -Nuclear medicine stress test normal September 2020  R/L cath 12/14/19  EF 55-60%   Prox RCA lesion is 20% stenosed.  Mid LAD lesion is 40% stenosed.  Findings:  Ao = 120/71 (95) LV = 117/8 RA = 5 RV = 67/6 PA = 68/28 (44) PCW =  6 Fick cardiac output/index = 4.9/2.4 PVR = 7.8 WU FA sat = 92% PA sat = 69%, 70%   Warren [name] denies symptoms worrisome for COVID 19.   Past Medical History:  Diagnosis Date  . CHF (congestive heart failure) ([place]   . Coronary artery disease   . Hyperlipidemia   . Hypertension    Past Surgical History:  Procedure Laterality Date  . CARDIAC CATHETERIZATION    . RIGHT/LEFT HEART CATH AND CORONARY ANGIOGRAPHY N/A 12/14/2019   Procedure: RIGHT/LEFT HEART CATH AND CORONARY ANGIOGRAPHY;  Surgeon: Jolaine Artist, MD;  Location: [facility];  Service: Cardiovascular;  Laterality: N/A;     Current Outpatient Medications  Medication Sig Dispense Refill  . acetaminophen (TYLENOL) 500 MG tablet Take 1,000 mg by mouth every 6 (six) hours as needed for moderate pain or headache.    Marland Kitchen aspirin EC 81 MG tablet Take 1 tablet (81 mg total) by mouth daily. 90 tablet 3  . ezetimibe (ZETIA) 10 MG tablet Take 10 mg by mouth daily.    . hydrochlorothiazide (HYDRODIURIL) 25 MG tablet Take 25 mg by mouth daily.     . macitentan (OPSUMIT) 10 MG tablet Take 1 tablet (10 mg total) by mouth daily. 30 tablet 11  . Menthol-Methyl Salicylate (MUSCLE RUB) 10-15 % CREA Apply 1 application topically as needed for muscle pain.    . OXYGEN Inhale 2-3 L into the lungs continuous. 2 L at rest and with  activity 3 L    . potassium chloride (KLOR-CON) 10 MEQ tablet Take 2 tablets (20 mEq total) by mouth daily. 60 tablet 3  . rosuvastatin (CRESTOR) 5 MG tablet Take 1 tablet (5 mg total) by mouth daily. 30 tablet 3  . sildenafil (REVATIO) 20 MG tablet Take 20 mg by mouth 3 (three) times daily.     No current facility-administered medications for this encounter.   Facility-Administered Medications Ordered in Other Encounters  Medication Dose Route Frequency Provider Last Rate Last Admin  . sodium chloride flush (NS) 0.9 % injection 10 mL  10 mL Intravenous PRN [name], Cassie Freer, MD   20 mL at 12/05/19  1125    Allergies:   Amlodipine besylate, Penicillins, and Valsartan   Social History:  The patient  reports that he quit smoking about 17 years ago. He has never used smokeless tobacco. He reports previous alcohol use. He reports that he does not use drugs.   Family History:  The patient's family history includes Heart disease in his mother.   ROS:  Please see the history of present illness.   All other systems are personally reviewed and negative.   Vitals:   05/01/20 1130  BP: (!) 158/90  Pulse: 73  SpO2: 96%  Weight: 87.6 kg (193 lb 3.2 oz)    Exam:  General:  Well appearing. No resp difficulty. On O2 HEENT: normal Neck: supple. no JVD. Carotids 2+ bilat; no bruits. No lymphadenopathy or thryomegaly appreciated. Cor: PMI nondisplaced. Regular rate & rhythm. No rubs, gallops or murmurs. Lungs: clear but mildly decreased Abdomen: soft, nontender, nondistended. No hepatosplenomegaly. No bruits or masses. Good bowel sounds. Extremities: no cyanosis, clubbing, rash, edema Neuro: alert & orientedx3, cranial nerves grossly intact. moves all 4 extremities w/o difficulty. Affect pleasant  Recent Labs: 10/29/2019: Pro B Natriuretic peptide (BNP) 145.0 01/01/2020: ALT 44; B Natriuretic Peptide 39.1; Hemoglobin 16.1; Platelets 208 02/07/2020: BUN 19; Creatinine, Ser 0.97; Potassium 3.9; Sodium 138  Personally reviewed   Wt Readings from Last 3 Encounters:  05/01/20 87.6 kg (193 lb 3.2 oz)  04/22/20 87.4 kg (192 lb 10.9 oz)  04/08/20 86.9 kg (191 lb 9.3 oz)      ASSESSMENT AND PLAN:  1.Moderate to severe pulmonary HTN - PFT 08/31/2019: no obstruction/restriction, severe diffusion defect (DCLO 25%) - Echo 9/20 LVEF normal. RV mild to moderate HK  - Echo today 21/9/21 EF 50-55% RV mildly to moderately HK  - CT negative for ILD - ANA negative, anti-CCP negative, anti-RA negative - VQ scan negative CTEPH - WHO Group I - likely IPAH - Improved NYHA II-early III - BNP 39.1 - 6MW  01/02/20 239m with sats down to 79% - 6MW 03/14/20 270m (stopped for 1 min due to drop in O2) - Reveal Lite 2.0 Risk Score 5 (low to intermediate risk)  - On macitentan 10 tolerating well - Increase sildenafil to 40 tid - Continue Pulmonray rehab - Will need repeat 6MW and RHC with possible addition of selexipag soon  - Needs home sleep study  2. Chronic respiratory failure due to [facility] - continue home O2. Follow sats with pulse ok and titrate to keep sats >= 90% - no change  3.CAD -Three-vessel calcification seen on recent lung CT - Minimal CAD by cath  - No s/s ischemia - Followed by Dr. [name]. Has been intolerant of statins.    Signed, Glori Bickers, MD  05/01/2020 12:29 PM  [facility] [place] [address]  [facility] [place] [phone] (office) [phone] (fax)

## 2020-05-03 DIAGNOSIS — G4734 Idiopathic sleep related nonobstructive alveolar hypoventilation: Secondary | ICD-10-CM | POA: Diagnosis not present

## 2020-05-03 DIAGNOSIS — G4733 Obstructive sleep apnea (adult) (pediatric): Secondary | ICD-10-CM

## 2020-05-05 ENCOUNTER — Other Ambulatory Visit (HOSPITAL_COMMUNITY): Payer: Self-pay | Admitting: *Deleted

## 2020-05-05 ENCOUNTER — Telehealth (HOSPITAL_COMMUNITY): Payer: Self-pay | Admitting: Pharmacist

## 2020-05-05 NOTE — Telephone Encounter (Signed)
Advanced Heart Failure Patient Advocate Encounter  Prior Authorization for sildenafil has been approved.    Effective dates: 05/25/19 through 05/23/20  Audry Riles, PharmD, BCPS, BCCP, CPP Heart Failure Clinic Pharmacist [phone]

## 2020-05-05 NOTE — Telephone Encounter (Signed)
Patient Advocate Encounter   Received notification from [facility] that prior authorization for sildenafil is required.   PA submitted on CoverMyMeds Key [id number] Status is pending   Will continue to follow.   Audry Riles, PharmD, BCPS, BCCP, CPP Heart Failure Clinic Pharmacist [phone]

## 2020-05-06 ENCOUNTER — Telehealth (HOSPITAL_COMMUNITY): Payer: Self-pay | Admitting: Pharmacist

## 2020-05-06 ENCOUNTER — Encounter (HOSPITAL_COMMUNITY)
Admission: RE | Admit: 2020-05-06 | Discharge: 2020-05-06 | Disposition: A | Discharge status: Home / Self Care | Payer: Medicare Other | Source: Ambulatory Visit | Attending: Internal Medicine | Admitting: Internal Medicine

## 2020-05-06 ENCOUNTER — Other Ambulatory Visit: Payer: Self-pay

## 2020-05-06 VITALS — Wt 191.4 lb

## 2020-05-06 DIAGNOSIS — I272 Pulmonary hypertension, unspecified: Secondary | ICD-10-CM | POA: Diagnosis not present

## 2020-05-06 NOTE — Telephone Encounter (Signed)
Patient Advocate Encounter   Received notification from [facility] that prior authorization for Opsumit is required.   PA submitted on CoverMyMeds Key [id number] Status is pending   Will continue to follow.   Audry Riles, PharmD, BCPS, BCCP, CPP Heart Failure Clinic Pharmacist [phone]

## 2020-05-06 NOTE — Telephone Encounter (Signed)
Advanced Heart Failure Patient Advocate Encounter  Prior Authorization for Opsumit has been approved.    Effective dates: 05/24/20 through 05/23/21   Chrys Landgrebe, PharmD, BCPS, BCCP, CPP Heart Failure Clinic Pharmacist [phone]    

## 2020-05-06 NOTE — Progress Notes (Signed)
Daily Session Note  Patient Details  Name: Warren [name] MRN: [id number] Date of Birth: July 12, 1950 Referring Provider:   Flowsheet Row PULMONARY REHAB OTHER RESP ORIENTATION from 03/14/2020 in [facility]  Referring Provider Dr. Haroldine Laws      Encounter Date: 05/06/2020  Check In:  Session Check In - 05/06/20 1330      Check-In   Supervising physician immediately available to respond to emergencies [facility] MD immediately available    Physician(s) Dr. Harl Bowie    Location [facility]    Staff Present Geanie Cooley, RN;Teagyn Fishel Audria Nine, MS, Exercise Physiologist    Virtual Visit No    Medication changes reported     No    Fall or balance concerns reported    No    Tobacco Cessation No Change    Warm-up and Cool-down Performed as group-led instruction    Resistance Training Performed Yes    VAD Patient? No    PAD/SET Patient? No      Pain Assessment   Currently in Pain? Yes    Pain Location Hip    Pain Orientation Right    Pain Descriptors / Indicators Constant    Multiple Pain Sites No           Capillary Blood Glucose: No results found for this or any previous visit (from the past 24 hour(s)).    Social History   Tobacco Use  Smoking Status Former Smoker  . Quit date: 05/24/2002  . Years since quitting: 17.9  Smokeless Tobacco Never Used    Goals Met:  Independence with exercise equipment Exercise tolerated well No report of cardiac concerns or symptoms Strength training completed today  Goals Unmet:  Not Applicable  Comments: check out 1430   Dr. Kathie Dike is Medical Director for [facility].

## 2020-05-08 ENCOUNTER — Encounter (HOSPITAL_COMMUNITY)
Admission: RE | Admit: 2020-05-08 | Discharge: 2020-05-08 | Disposition: A | Discharge status: Home / Self Care | Payer: Medicare Other | Source: Ambulatory Visit | Attending: Internal Medicine | Admitting: Internal Medicine

## 2020-05-08 ENCOUNTER — Other Ambulatory Visit: Payer: Self-pay

## 2020-05-08 DIAGNOSIS — I272 Pulmonary hypertension, unspecified: Secondary | ICD-10-CM | POA: Diagnosis not present

## 2020-05-08 NOTE — Progress Notes (Signed)
Pulmonary Individual Treatment Plan  Patient Details  Name: Warren [name] MRN: [id number] Date of Birth: Feb 12, 1951 Referring Provider:   [place] from 03/14/2020 in [facility]  Referring Provider Dr. Haroldine Laws      Initial Encounter Date:  Flowsheet Row PULMONARY REHAB OTHER RESP ORIENTATION from 03/14/2020 in [facility]  Date 03/14/20      Visit Diagnosis: Pulmonary hypertension, unspecified ([place])  Patient's Home Medications on Admission:   Current Outpatient Medications:    acetaminophen (TYLENOL) 500 MG tablet, Take 1,000 mg by mouth every 6 (six) hours as needed for moderate pain or headache., Disp: , Rfl:    aspirin EC 81 MG tablet, Take 1 tablet (81 mg total) by mouth daily., Disp: 90 tablet, Rfl: 3   ezetimibe (ZETIA) 10 MG tablet, Take 10 mg by mouth daily., Disp: , Rfl:    hydrochlorothiazide (HYDRODIURIL) 25 MG tablet, Take 25 mg by mouth daily. , Disp: , Rfl:    macitentan (OPSUMIT) 10 MG tablet, Take 1 tablet (10 mg total) by mouth daily., Disp: 30 tablet, Rfl: 11   Menthol-Methyl Salicylate (MUSCLE RUB) 10-15 % CREA, Apply 1 application topically as needed for muscle pain., Disp: , Rfl:    OXYGEN, Inhale 2-3 L into the lungs continuous. 2 L at rest and with activity 3 L, Disp: , Rfl:    potassium chloride (KLOR-CON) 10 MEQ tablet, Take 2 tablets (20 mEq total) by mouth daily., Disp: 60 tablet, Rfl: 3   rosuvastatin (CRESTOR) 5 MG tablet, Take 1 tablet (5 mg total) by mouth daily., Disp: 30 tablet, Rfl: 3   sildenafil (REVATIO) 20 MG tablet, Take 2 tablets (40 mg total) by mouth 3 (three) times daily., Disp: 90 tablet, Rfl: 6 No current facility-administered medications for this encounter.  Facility-Administered Medications Ordered in Other Encounters:    sodium chloride flush (NS) 0.9 % injection 10 mL, 10 mL, Intravenous, PRN, [name], Cassie Freer, MD, 20 mL at  12/05/19 1125  Past Medical History: Past Medical History:  Diagnosis Date   CHF (congestive heart failure) ([place])    Coronary artery disease    Hyperlipidemia    Hypertension     Tobacco Use: Social History   Tobacco Use  Smoking Status Former Smoker   Quit date: 05/24/2002   Years since quitting: 17.9  Smokeless Tobacco Never Used    Labs: Recent [employer] for ITP Cardiac and Pulmonary Rehab Latest Ref Rng & Units 11/15/2008 11/16/2008 12/14/2019 12/14/2019 12/14/2019   Cholestrol 0 - 200 mg/dL - 133 - - -   LDLCALC 0 - 99 mg/dL - 68 - - -   HDL >39 mg/dL - 41 - - -   Trlycerides <150 mg/dL - 120 - - -   Hemoglobin A1c % 5.5 - - - -   PHART 7.350 - 7.450 - - 7.421 - -   PCO2ART 32.0 - 48.0 mmHg - - 37.7 - -   HCO3 20.0 - 28.0 mmol/L - - 24.5 20.8 27.0   TCO2 22 - 32 mmol/L - - 26 22 28    ACIDBASEDEF 0.0 - 2.0 mmol/L - - - 4.0(H) -   O2SAT % - - 92.0 69.0 71.0      Capillary Blood Glucose: No results found for: GLUCAP   Pulmonary Assessment Scores:  Pulmonary Assessment Scores    Row Name 03/14/20 [place]  SOB Score total 24           CAT Score   CAT Score 17           mMRC Score   mMRC Score 4           UCSD: Self-administered rating of dyspnea associated with activities of daily living (ADLs) 6-point scale (0 = "not at all" to 5 = "maximal or unable to do because of breathlessness")  Scoring Scores range from 0 to 120.  Minimally important difference is 5 units  CAT: CAT can identify the health impairment of COPD patients and is better correlated with disease progression.  CAT has a scoring range of zero to 40. The CAT score is classified into four groups of low (less than 10), medium (10 - 20), high (21-30) and very high (31-40) based on the impact level of disease on health status. A CAT score over 10 suggests significant symptoms.  A worsening CAT score could be explained by an exacerbation, poor medication  adherence, poor inhaler technique, or progression of COPD or comorbid conditions.  CAT MCID is 2 points  mMRC: mMRC (Modified Medical Research Council) Dyspnea Scale is used to assess the degree of baseline functional disability in patients of respiratory disease due to dyspnea. No minimal important difference is established. A decrease in score of 1 point or greater is considered a positive change.   Pulmonary Function Assessment:  Pulmonary Function Assessment - 03/14/20 1309      Breath   Shortness of Breath Yes;Limiting activity           Exercise Target Goals: Exercise Program Goal: Individual exercise prescription set using results from initial 6 min walk test and THRR while considering  patients activity barriers and safety.   Exercise Prescription Goal: Initial exercise prescription builds to 30-45 minutes a day of aerobic activity, 2-3 days per week.  Home exercise guidelines will be given to patient during program as part of exercise prescription that the participant will acknowledge.  Activity Barriers & Risk Stratification:  Activity Barriers & Cardiac Risk Stratification - 03/14/20 1302      Activity Barriers & Cardiac Risk Stratification   Activity Barriers Arthritis;Back Problems;Neck/Spine Problems;Joint Problems;Deconditioning;Muscular Weakness;Shortness of Breath    Cardiac Risk Stratification Moderate           6 Minute Walk:  6 Minute Walk    Row Name 03/14/20 1449         6 Minute Walk   Phase Initial     Distance 950 feet     Walk Time 6 minutes     # of Rest Breaks 1     MPH 1.8     METS 2.61     RPE 13     Perceived Dyspnea  15     VO2 Peak 9.13     Symptoms Yes (comment)     Comments Pt stopped for 1 min 15 seconds to allow for SpO2 to increase to >80%     Resting HR 70 bpm     Resting BP 96/64     Resting Oxygen Saturation  96 %     Exercise Oxygen Saturation  during 6 min walk 78 %     Max Ex. HR 115 bpm     Max Ex. BP 144/82      2 Minute Post BP 110/56           Interval HR   1 Minute HR 84  2 Minute HR 114     3 Minute HR 95     4 Minute HR 108     5 Minute HR 115     6 Minute HR 115     2 Minute Post HR 88     Interval Heart Rate? Yes           Interval Oxygen   Interval Oxygen? Yes     Baseline Oxygen Saturation % 96 %     1 Minute Oxygen Saturation % 87 %     1 Minute Liters of Oxygen 4 L     2 Minute Oxygen Saturation % 78 %     2 Minute Liters of Oxygen 6 L     3 Minute Oxygen Saturation % 82 %     3 Minute Liters of Oxygen 8 L     4 Minute Oxygen Saturation % 85 %     4 Minute Liters of Oxygen 8 L     5 Minute Oxygen Saturation % 81 %     5 Minute Liters of Oxygen 8 L     6 Minute Oxygen Saturation % 78 %     6 Minute Liters of Oxygen 8 L     2 Minute Post Oxygen Saturation % 89 %     2 Minute Post Liters of Oxygen 8 L            Oxygen Initial Assessment:  Oxygen Initial Assessment - 03/14/20 1428      Initial 6 min Walk   Oxygen Used Continuous    Liters per minute 8      Program Oxygen Prescription   Program Oxygen Prescription Continuous    Liters per minute 8      Intervention   Short Term Goals To learn and exhibit compliance with exercise, home and travel O2 prescription;To learn and understand importance of monitoring SPO2 with pulse oximeter and demonstrate accurate use of the pulse oximeter.;To learn and demonstrate proper use of respiratory medications;To learn and demonstrate proper pursed lip breathing techniques or other breathing techniques.;To learn and understand importance of maintaining oxygen saturations>88%    Long  Term Goals Exhibits compliance with exercise, home and travel O2 prescription;Verbalizes importance of monitoring SPO2 with pulse oximeter and return demonstration;Maintenance of O2 saturations>88%;Exhibits proper breathing techniques, such as pursed lip breathing or other method taught during program session;Compliance with respiratory  medication;Demonstrates proper use of MDIs           Oxygen Re-Evaluation:  Oxygen Re-Evaluation    Row Name 03/18/20 1443 04/08/20 1542 05/06/20 1528         Program Oxygen Prescription   Program Oxygen Prescription Continuous Continuous Continuous     Liters per minute 8 8 8            Home Oxygen   Home Oxygen Device E-Tanks;Home Concentrator -- E-Tanks;Home Concentrator     Sleep Oxygen Prescription Continuous -- Continuous     Liters per minute 3 -- --     Home Exercise Oxygen Prescription Continuous Continuous Continuous     Liters per minute 8 8 --     Home Resting Oxygen Prescription Continuous Continuous Continuous     Liters per minute 3 3 --     Compliance with Home Oxygen Use Yes Yes Yes           Goals/Expected Outcomes   Short Term Goals To learn and exhibit compliance with exercise, home and travel O2 prescription;To  learn and understand importance of monitoring SPO2 with pulse oximeter and demonstrate accurate use of the pulse oximeter.;To learn and demonstrate proper use of respiratory medications;To learn and demonstrate proper pursed lip breathing techniques or other breathing techniques.;To learn and understand importance of maintaining oxygen saturations>88% To learn and exhibit compliance with exercise, home and travel O2 prescription;To learn and understand importance of monitoring SPO2 with pulse oximeter and demonstrate accurate use of the pulse oximeter.;To learn and understand importance of maintaining oxygen saturations>88%;To learn and demonstrate proper pursed lip breathing techniques or other breathing techniques.;To learn and demonstrate proper use of respiratory medications To learn and exhibit compliance with exercise, home and travel O2 prescription;To learn and understand importance of monitoring SPO2 with pulse oximeter and demonstrate accurate use of the pulse oximeter.;To learn and understand importance of maintaining oxygen saturations>88%;To  learn and demonstrate proper pursed lip breathing techniques or other breathing techniques.;To learn and demonstrate proper use of respiratory medications     Long  Term Goals Exhibits compliance with exercise, home and travel O2 prescription;Verbalizes importance of monitoring SPO2 with pulse oximeter and return demonstration;Maintenance of O2 saturations>88%;Exhibits proper breathing techniques, such as pursed lip breathing or other method taught during program session;Compliance with respiratory medication;Demonstrates proper use of MDIs Demonstrates proper use of MDIs;Compliance with respiratory medication;Exhibits proper breathing techniques, such as pursed lip breathing or other method taught during program session;Maintenance of O2 saturations>88%;Verbalizes importance of monitoring SPO2 with pulse oximeter and return demonstration;Exhibits compliance with exercise, home and travel O2 prescription Exhibits compliance with exercise, home and travel O2 prescription;Verbalizes importance of monitoring SPO2 with pulse oximeter and return demonstration;Maintenance of O2 saturations>88%;Compliance with respiratory medication;Demonstrates proper use of MDIs;Exhibits proper breathing techniques, such as pursed lip breathing or other method taught during program session     Goals/Expected Outcomes compliance Compliance Compliance            Oxygen Discharge (Final Oxygen Re-Evaluation):  Oxygen Re-Evaluation - 05/06/20 1528      Program Oxygen Prescription   Program Oxygen Prescription Continuous    Liters per minute 8      Home Oxygen   Home Oxygen Device E-Tanks;Home Concentrator    Sleep Oxygen Prescription Continuous    Home Exercise Oxygen Prescription Continuous    Home Resting Oxygen Prescription Continuous    Compliance with Home Oxygen Use Yes      Goals/Expected Outcomes   Short Term Goals To learn and exhibit compliance with exercise, home and travel O2 prescription;To learn  and understand importance of monitoring SPO2 with pulse oximeter and demonstrate accurate use of the pulse oximeter.;To learn and understand importance of maintaining oxygen saturations>88%;To learn and demonstrate proper pursed lip breathing techniques or other breathing techniques.;To learn and demonstrate proper use of respiratory medications    Long  Term Goals Exhibits compliance with exercise, home and travel O2 prescription;Verbalizes importance of monitoring SPO2 with pulse oximeter and return demonstration;Maintenance of O2 saturations>88%;Compliance with respiratory medication;Demonstrates proper use of MDIs;Exhibits proper breathing techniques, such as pursed lip breathing or other method taught during program session    Goals/Expected Outcomes Compliance           Initial Exercise Prescription:  Initial Exercise Prescription - 03/14/20 1400      Date of Initial Exercise RX and Referring Provider   Date 03/14/20    Referring Provider Dr. Haroldine Laws    Expected Discharge Date 07/18/20      Oxygen   Oxygen Continuous    Liters 8  NuStep   Level 1    SPM 80    Minutes 39      Prescription Details   Frequency (times per week) 2    Duration Progress to 30 minutes of continuous aerobic without signs/symptoms of physical distress      Intensity   THRR 40-80% of Max Heartrate 61-122    Ratings of Perceived Exertion 11-13    Perceived Dyspnea 0-4      Resistance Training   Training Prescription Yes    Weight 2 lbs    Reps 10-15           Perform Capillary Blood Glucose checks as needed.  Exercise Prescription Changes:   Exercise Prescription Changes    Row Name 03/18/20 1400 04/01/20 1500 04/08/20 1400 04/22/20 1500 05/06/20 1500     Response to Exercise   Blood Pressure (Admit) 112/70 108/84 112/84 114/82 110/62   Blood Pressure (Exercise) 130/82 128/76 152/70 136/82 120/68   Blood Pressure (Exit) 102/62 102/70 112/70 122/84 118/76   Heart Rate (Admit)  82 bpm 95 bpm 92 bpm 89 bpm 90 bpm   Heart Rate (Exercise) 92 bpm 119 bpm 106 bpm 103 bpm 104 bpm   Heart Rate (Exit) 82 bpm 108 bpm 89 bpm 80 bpm 87 bpm   Oxygen Saturation (Admit) 92 % 94 % 92 % 97 % 94 %   Oxygen Saturation (Exercise) 91 % 86 % 89 % 91 % 88 %   Oxygen Saturation (Exit) 96 % 97 % 98 % 96 % 97 %   Rating of Perceived Exertion (Exercise) 9 12 12 11 10    Perceived Dyspnea (Exercise) 11 12 12 11 11    Duration Continue with 30 min of aerobic exercise without signs/symptoms of physical distress. Continue with 30 min of aerobic exercise without signs/symptoms of physical distress. Continue with 30 min of aerobic exercise without signs/symptoms of physical distress. Continue with 30 min of aerobic exercise without signs/symptoms of physical distress. Continue with 30 min of aerobic exercise without signs/symptoms of physical distress.   Intensity THRR unchanged THRR unchanged THRR unchanged THRR unchanged THRR unchanged     Progression   Progression Continue to progress workloads to maintain intensity without signs/symptoms of physical distress. Continue to progress workloads to maintain intensity without signs/symptoms of physical distress. Continue to progress workloads to maintain intensity without signs/symptoms of physical distress. Continue to progress workloads to maintain intensity without signs/symptoms of physical distress. Continue to progress workloads to maintain intensity without signs/symptoms of physical distress.     Resistance Training   Training Prescription Yes Yes Yes Yes Yes   Weight 2 lbs 3 lbs 3 lbs 3 lbs 3 lbs   Reps 10-15 10-15 10-15 10-15 10-15   Time 10 Minutes 10 Minutes 10 Minutes 10 Minutes 10 Minutes     Oxygen   Oxygen Continuous Continuous Continuous Continuous Continuous   Liters 8 8 8 8 8      Treadmill   MPH -- -- -- 1.4 1.6   Grade -- -- -- 0 0   Minutes -- -- -- 17 17   METs -- -- -- 2.1 2.2     NuStep   Level 1 2 2 2 2    SPM 76 98 100  93 96   Minutes 39 39 39 22 22   METs 1.9 2 2.1 2 2.1          Exercise Comments:   Exercise Comments    Row Name 03/18/20 1443  Exercise Comments Pt completed his first exercise session of pulmonary rehab today. He was able to tolerate exercise well with no complaints. 8L of continuous oxygen was adequate to maintain his oxygen saturations >90%.              Exercise Goals and Review:   Exercise Goals    Row Name 03/14/20 1453 03/18/20 1446 04/08/20 1543 05/06/20 1524       Exercise Goals   Increase Physical Activity Yes Yes Yes Yes    Intervention Provide advice, education, support and counseling about physical activity/exercise needs.;Develop an individualized exercise prescription for aerobic and resistive training based on initial evaluation findings, risk stratification, comorbidities and participant's personal goals. Provide advice, education, support and counseling about physical activity/exercise needs.;Develop an individualized exercise prescription for aerobic and resistive training based on initial evaluation findings, risk stratification, comorbidities and participant's personal goals. Provide advice, education, support and counseling about physical activity/exercise needs.;Develop an individualized exercise prescription for aerobic and resistive training based on initial evaluation findings, risk stratification, comorbidities and participant's personal goals. Provide advice, education, support and counseling about physical activity/exercise needs.;Develop an individualized exercise prescription for aerobic and resistive training based on initial evaluation findings, risk stratification, comorbidities and participant's personal goals.    Expected Outcomes Short Term: Attend rehab on a regular basis to increase amount of physical activity.;Long Term: Add in home exercise to make exercise part of routine and to increase amount of physical activity.;Long Term:  Exercising regularly at least 3-5 days a week. Short Term: Attend rehab on a regular basis to increase amount of physical activity.;Long Term: Add in home exercise to make exercise part of routine and to increase amount of physical activity.;Long Term: Exercising regularly at least 3-5 days a week. Short Term: Attend rehab on a regular basis to increase amount of physical activity.;Long Term: Add in home exercise to make exercise part of routine and to increase amount of physical activity.;Long Term: Exercising regularly at least 3-5 days a week. Short Term: Attend rehab on a regular basis to increase amount of physical activity.;Long Term: Add in home exercise to make exercise part of routine and to increase amount of physical activity.;Long Term: Exercising regularly at least 3-5 days a week.    Increase Strength and Stamina Yes Yes Yes Yes    Intervention Provide advice, education, support and counseling about physical activity/exercise needs.;Develop an individualized exercise prescription for aerobic and resistive training based on initial evaluation findings, risk stratification, comorbidities and participant's personal goals. Provide advice, education, support and counseling about physical activity/exercise needs.;Develop an individualized exercise prescription for aerobic and resistive training based on initial evaluation findings, risk stratification, comorbidities and participant's personal goals. Provide advice, education, support and counseling about physical activity/exercise needs.;Develop an individualized exercise prescription for aerobic and resistive training based on initial evaluation findings, risk stratification, comorbidities and participant's personal goals. Provide advice, education, support and counseling about physical activity/exercise needs.;Develop an individualized exercise prescription for aerobic and resistive training based on initial evaluation findings, risk stratification,  comorbidities and participant's personal goals.    Expected Outcomes Short Term: Increase workloads from initial exercise prescription for resistance, speed, and METs.;Short Term: Perform resistance training exercises routinely during rehab and add in resistance training at home;Long Term: Improve cardiorespiratory fitness, muscular endurance and strength as measured by increased METs and functional capacity (6MWT) Short Term: Increase workloads from initial exercise prescription for resistance, speed, and METs.;Short Term: Perform resistance training exercises routinely during rehab and add in resistance training at  home;Long Term: Improve cardiorespiratory fitness, muscular endurance and strength as measured by increased METs and functional capacity (6MWT) Short Term: Increase workloads from initial exercise prescription for resistance, speed, and METs.;Short Term: Perform resistance training exercises routinely during rehab and add in resistance training at home;Long Term: Improve cardiorespiratory fitness, muscular endurance and strength as measured by increased METs and functional capacity (6MWT) Short Term: Increase workloads from initial exercise prescription for resistance, speed, and METs.;Short Term: Perform resistance training exercises routinely during rehab and add in resistance training at home;Long Term: Improve cardiorespiratory fitness, muscular endurance and strength as measured by increased METs and functional capacity (6MWT)    Able to understand and use rate of perceived exertion (RPE) scale Yes Yes Yes Yes    Intervention Provide education and explanation on how to use RPE scale Provide education and explanation on how to use RPE scale Provide education and explanation on how to use RPE scale Provide education and explanation on how to use RPE scale    Expected Outcomes Short Term: Able to use RPE daily in rehab to express subjective intensity level;Long Term:  Able to use RPE to guide  intensity level when exercising independently Short Term: Able to use RPE daily in rehab to express subjective intensity level;Long Term:  Able to use RPE to guide intensity level when exercising independently Short Term: Able to use RPE daily in rehab to express subjective intensity level;Long Term:  Able to use RPE to guide intensity level when exercising independently Short Term: Able to use RPE daily in rehab to express subjective intensity level;Long Term:  Able to use RPE to guide intensity level when exercising independently    Able to understand and use Dyspnea scale Yes Yes Yes Yes    Intervention Provide education and explanation on how to use Dyspnea scale Provide education and explanation on how to use Dyspnea scale Provide education and explanation on how to use Dyspnea scale Provide education and explanation on how to use Dyspnea scale    Expected Outcomes Short Term: Able to use Dyspnea scale daily in rehab to express subjective sense of shortness of breath during exertion;Long Term: Able to use Dyspnea scale to guide intensity level when exercising independently Short Term: Able to use Dyspnea scale daily in rehab to express subjective sense of shortness of breath during exertion;Long Term: Able to use Dyspnea scale to guide intensity level when exercising independently Short Term: Able to use Dyspnea scale daily in rehab to express subjective sense of shortness of breath during exertion;Long Term: Able to use Dyspnea scale to guide intensity level when exercising independently Short Term: Able to use Dyspnea scale daily in rehab to express subjective sense of shortness of breath during exertion;Long Term: Able to use Dyspnea scale to guide intensity level when exercising independently    Knowledge and understanding of Target Heart Rate Range (THRR) Yes Yes Yes Yes    Intervention Provide education and explanation of THRR including how the numbers were predicted and where they are located for  reference Provide education and explanation of THRR including how the numbers were predicted and where they are located for reference Provide education and explanation of THRR including how the numbers were predicted and where they are located for reference Provide education and explanation of THRR including how the numbers were predicted and where they are located for reference    Expected Outcomes Short Term: Able to state/look up THRR;Long Term: Able to use THRR to govern intensity when exercising independently;Short Term:  Able to use daily as guideline for intensity in rehab Short Term: Able to state/look up THRR;Long Term: Able to use THRR to govern intensity when exercising independently;Short Term: Able to use daily as guideline for intensity in rehab Short Term: Able to state/look up THRR;Short Term: Able to use daily as guideline for intensity in rehab;Long Term: Able to use THRR to govern intensity when exercising independently Short Term: Able to state/look up THRR;Short Term: Able to use daily as guideline for intensity in rehab;Long Term: Able to use THRR to govern intensity when exercising independently    Understanding of Exercise Prescription Yes Yes Yes Yes    Intervention Provide education, explanation, and written materials on patient's individual exercise prescription Provide education, explanation, and written materials on patient's individual exercise prescription Provide education, explanation, and written materials on patient's individual exercise prescription Provide education, explanation, and written materials on patient's individual exercise prescription    Expected Outcomes Short Term: Able to explain program exercise prescription;Long Term: Able to explain home exercise prescription to exercise independently Short Term: Able to explain program exercise prescription;Long Term: Able to explain home exercise prescription to exercise independently Short Term: Able to explain program  exercise prescription;Long Term: Able to explain home exercise prescription to exercise independently Short Term: Able to explain program exercise prescription;Long Term: Able to explain home exercise prescription to exercise independently           Exercise Goals Re-Evaluation :  Exercise Goals Re-Evaluation    Row Name 03/18/20 1446 04/08/20 1543 05/06/20 1525         Exercise Goal Re-Evaluation   Exercise Goals Review Increase Physical Activity;Increase Strength and Stamina;Able to understand and use rate of perceived exertion (RPE) scale;Able to understand and use Dyspnea scale;Knowledge and understanding of Target Heart Rate Range (THRR);Understanding of Exercise Prescription Increase Physical Activity;Increase Strength and Stamina;Able to understand and use rate of perceived exertion (RPE) scale;Able to understand and use Dyspnea scale;Knowledge and understanding of Target Heart Rate Range (THRR);Understanding of Exercise Prescription Increase Strength and Stamina;Increase Physical Activity;Able to understand and use rate of perceived exertion (RPE) scale;Able to understand and use Dyspnea scale;Knowledge and understanding of Target Heart Rate Range (THRR);Understanding of Exercise Prescription     Comments Pt has completed 1 exercise session. 8 L of O2 was able to maintain his oxygen saturations >90%. He is eager to be in the program. He exercised at 1.9 METs on his first day. WIll continue to monitor and progress as able. Pt has completed 7 exercise sessions. He is progressing well and is going to try the treadmill next week. He feels he is getting stronger. He is exercising at 2.1 METs on the stepper. Will continue to monitor and progress exercise as able. Patient has completed 13 exercise sessions. He is tolerating exercise well and progressing well. He just started using the treadmill and has adapted well to that. He is getting stronger as well in regards to the strength training. He is  currently exercising at 2.1 METs on the NuStep. Will continue to monitor and progress exercise as able.     Expected Outcomes Through exercise at rehab and by engaging in a home exercise program, the patient will be able to reach their goals. Through exercising at rehab and at home patient will reach their goals. Through exercise at rehab and a home exercise program, patient will achieve their goals.            Discharge Exercise Prescription (Final Exercise Prescription Changes):  Exercise  Prescription Changes - 05/06/20 1500      Response to Exercise   Blood Pressure (Admit) 110/62    Blood Pressure (Exercise) 120/68    Blood Pressure (Exit) 118/76    Heart Rate (Admit) 90 bpm    Heart Rate (Exercise) 104 bpm    Heart Rate (Exit) 87 bpm    Oxygen Saturation (Admit) 94 %    Oxygen Saturation (Exercise) 88 %    Oxygen Saturation (Exit) 97 %    Rating of Perceived Exertion (Exercise) 10    Perceived Dyspnea (Exercise) 11    Duration Continue with 30 min of aerobic exercise without signs/symptoms of physical distress.    Intensity THRR unchanged      Progression   Progression Continue to progress workloads to maintain intensity without signs/symptoms of physical distress.      Resistance Training   Training Prescription Yes    Weight 3 lbs    Reps 10-15    Time 10 Minutes      Oxygen   Oxygen Continuous    Liters 8      Treadmill   MPH 1.6    Grade 0    Minutes 17    METs 2.2      NuStep   Level 2    SPM 96    Minutes 22    METs 2.1           Nutrition:  Target Goals: Understanding of nutrition guidelines, daily intake of sodium 1500mg , cholesterol 200mg , calories 30% from fat and 7% or less from saturated fats, daily to have 5 or more servings of fruits and vegetables.  Biometrics:  Pre Biometrics - 05/06/20 1528      Pre Biometrics   Weight 86.8 kg            Nutrition Therapy Plan and Nutrition Goals:  Nutrition Therapy & Goals - 05/05/20 0939       Personal Nutrition Goals   Comments We will continue to provide nutritional education through hand-outs.      Intervention Plan   Intervention Nutrition handout(s) given to patient.           Nutrition Assessments:  Nutrition Assessments - 03/14/20 1429      MEDFICTS Scores   Pre Score 36          MEDIFICTS Score Key:  ?70 Need to make dietary changes   40-70 Heart Healthy Diet  ? 40 Therapeutic Level Cholesterol Diet   Picture Your Plate Scores:  <62 Unhealthy dietary pattern with much room for improvement.  41-50 Dietary pattern unlikely to meet recommendations for good health and room for improvement.  51-60 More healthful dietary pattern, with some room for improvement.   >60 Healthy dietary pattern, although there may be some specific behaviors that could be improved.    Nutrition Goals Re-Evaluation:   Nutrition Goals Discharge (Final Nutrition Goals Re-Evaluation):   Psychosocial: Target Goals: Acknowledge presence or absence of significant depression and/or stress, maximize coping skills, provide positive support system. Participant is able to verbalize types and ability to use techniques and skills needed for reducing stress and depression.  Initial Review & Psychosocial Screening:  Initial Psych Review & Screening - 03/14/20 1310      Initial Review   Current issues with None Identified      Family Dynamics   Good Support System? Yes    Comments Great support system with wife and his brothers      Barriers  Psychosocial barriers to participate in program There are no identifiable barriers or psychosocial needs.      Screening Interventions   Interventions Encouraged to exercise           Quality of Life Scores:  Quality of Life - 03/14/20 1448      Quality of Life   Select Quality of Life      Quality of Life Scores   Health/Function Pre 13.5 %    Socioeconomic Pre 26.25 %    Psych/Spiritual Pre 14.64 %    Family Pre 26.38  %    GLOBAL Pre 17.18 %          Scores of 19 and below usually indicate a poorer quality of life in these areas.  A difference of  2-3 points is a clinically meaningful difference.  A difference of 2-3 points in the total score of the Quality of Life Index has been associated with significant improvement in overall quality of life, self-image, physical symptoms, and general health in studies assessing change in quality of life.   PHQ-9: Recent Review Flowsheet Data    Depression screen [facility] 2/9 03/14/2020   Decreased Interest 0   Down, Depressed, Hopeless 0   PHQ - 2 Score 0   Altered sleeping 0   Tired, decreased energy 1   Change in appetite 1   Feeling bad or failure about yourself  0   Trouble concentrating 0   Moving slowly or fidgety/restless 0   Suicidal thoughts 0   PHQ-9 Score 2   Difficult doing work/chores Not difficult at all     Interpretation of Total Score  Total Score Depression Severity:  1-4 = Minimal depression, 5-9 = Mild depression, 10-14 = Moderate depression, 15-19 = Moderately severe depression, 20-27 = Severe depression   Psychosocial Evaluation and Intervention:   Psychosocial Re-Evaluation:  Psychosocial Re-Evaluation    Row Name 03/20/20 0749 04/07/20 1331 05/05/20 0939         Psychosocial Re-Evaluation   Current issues with None Identified -- None Identified     Comments Patient's initial QOL score was 17.18% and his PHQ-9 score was 2 with no psychosocial issues identified. He has a strong support system and a good positive outlook. Will continue to monitor. Patient continues to have no psychosocial issues identified. He has a positive attitude and works hard during his sessions. He continues to use O2 but says he is starting to feel stronger. Will continue to monitor for progress. Patient continues to have no psychosocial issues identified. He continues to demonstrate a positive attitude and outlook and works hard during his sessions. He  continues to use O2 but says he is starting to feel stronger. Will continue to monitor for progress.     Expected Outcomes Patient will no have any psychosocial issues identified at discharge. Patient will no have any psychosocial issues identified at discharge. Patient will no have any psychosocial issues identified at discharge.     Interventions Encouraged to attend Pulmonary Rehabilitation for the exercise;Relaxation education;Stress management education Encouraged to attend Pulmonary Rehabilitation for the exercise;Relaxation education;Stress management education Encouraged to attend Pulmonary Rehabilitation for the exercise;Relaxation education;Stress management education     Continue Psychosocial Services  No Follow up required No Follow up required No Follow up required            Psychosocial Discharge (Final Psychosocial Re-Evaluation):  Psychosocial Re-Evaluation - 05/05/20 0939      Psychosocial Re-Evaluation   Current issues with None  Identified    Comments Patient continues to have no psychosocial issues identified. He continues to demonstrate a positive attitude and outlook and works hard during his sessions. He continues to use O2 but says he is starting to feel stronger. Will continue to monitor for progress.    Expected Outcomes Patient will no have any psychosocial issues identified at discharge.    Interventions Encouraged to attend Pulmonary Rehabilitation for the exercise;Relaxation education;Stress management education    Continue Psychosocial Services  No Follow up required            Education: Education Goals: Education classes will be provided on a weekly basis, covering required topics. Participant will state understanding/return demonstration of topics presented.  Learning Barriers/Preferences:  Learning Barriers/Preferences - 03/14/20 1312      Learning Barriers/Preferences   Learning Barriers None    Learning Preferences Skilled Demonstration            Education Topics: How Lungs Work and Diseases: - Discuss the anatomy of the lungs and diseases that can affect the lungs, such as COPD.   Exercise: -Discuss the importance of exercise, FITT principles of exercise, normal and abnormal responses to exercise, and how to exercise safely.   Environmental Irritants: -Discuss types of environmental irritants and how to limit exposure to environmental irritants.   Meds/Inhalers and oxygen: - Discuss respiratory medications, definition of an inhaler and oxygen, and the proper way to use an inhaler and oxygen.   Energy Saving Techniques: - Discuss methods to conserve energy and decrease shortness of breath when performing activities of daily living.  Flowsheet Row PULMONARY REHAB OTHER RESPIRATORY from 04/24/2020 in [place]  Date 03/20/20  Educator DF  Instruction Review Code 2- Demonstrated Understanding      Bronchial Hygiene / Breathing Techniques: - Discuss breathing mechanics, pursed-lip breathing technique,  proper posture, effective ways to clear airways, and other functional breathing techniques Flowsheet Row PULMONARY REHAB OTHER RESPIRATORY from 04/24/2020 in [place]  Date 03/27/20  Educator DF  Instruction Review Code 2- Demonstrated Understanding      Cleaning Equipment: - Provides group verbal and written instruction about the health risks of elevated stress, cause of high stress, and healthy ways to reduce stress. Flowsheet Row PULMONARY REHAB OTHER RESPIRATORY from 04/24/2020 in [place]  Date 04/03/20  Educator DJ  Instruction Review Code 1- Verbalizes Understanding      Nutrition I: Fats: - Discuss the types of cholesterol, what cholesterol does to the body, and how cholesterol levels can be controlled. Flowsheet Row PULMONARY REHAB OTHER RESPIRATORY from 04/24/2020 in [place]  Date 04/10/20  Educator DJ   Instruction Review Code 1- Verbalizes Understanding      Nutrition II: Labels: -Discuss the different components of food labels and how to read food labels. Flowsheet Row PULMONARY REHAB OTHER RESPIRATORY from 04/24/2020 in [place] [address]  Date 04/15/20  Educator PB  Instruction Review Code 2- Demonstrated Understanding      Respiratory Infections: - Discuss the signs and symptoms of respiratory infections, ways to prevent respiratory infections, and the importance of seeking medical treatment when having a respiratory infection. Flowsheet Row PULMONARY REHAB OTHER RESPIRATORY from 04/24/2020 in [place]  Date 04/24/20  Educator DF  Instruction Review Code 1- Verbalizes Understanding      Stress I: Signs and Symptoms: - Discuss the causes of stress, how stress may lead to anxiety and depression, and ways to limit  stress.   Stress II: Relaxation: -Discuss relaxation techniques to limit stress.   Oxygen for Home/Travel: - Discuss how to prepare for travel when on oxygen and proper ways to transport and store oxygen to ensure safety.   Knowledge Questionnaire Score:  Knowledge Questionnaire Score - 03/14/20 1429      Knowledge Questionnaire Score   Pre Score 5/18           Core Components/Risk Factors/Patient Goals at Admission:  Personal Goals and Risk Factors at Admission - 03/14/20 1314      Core Components/Risk Factors/Patient Goals on Admission    Weight Management Weight Loss;Yes    Admit Weight 193 lb (87.5 kg)    Goal Weight: Short Term 185 lb (83.9 kg)    Expected Outcomes Short Term: Continue to assess and modify interventions until short term weight is achieved;Long Term: Adherence to nutrition and physical activity/exercise program aimed toward attainment of established weight goal;Weight Maintenance: Understanding of the daily nutrition guidelines, which includes 25-35% calories from fat, 7% or less cal from  saturated fats, less than 200mg  cholesterol, less than 1.5gm of sodium, & 5 or more servings of fruits and vegetables daily;Weight Loss: Understanding of general recommendations for a balanced deficit meal plan, which promotes 1-2 lb weight loss per week and includes a negative energy balance of [phone] kcal/d;Understanding recommendations for meals to include 15-35% energy as protein, 25-35% energy from fat, 35-60% energy from carbohydrates, less than 200mg  of dietary cholesterol, 20-35 gm of total fiber daily;Understanding of distribution of calorie intake throughout the day with the consumption of 4-5 meals/snacks;Weight Gain: Understanding of general recommendations for a high calorie, high protein meal plan that promotes weight gain by distributing calorie intake throughout the day with the consumption for 4-5 meals, snacks, and/or supplements    Improve shortness of breath with ADL's Yes    Intervention Provide education, individualized exercise plan and daily activity instruction to help decrease symptoms of SOB with activities of daily living.    Expected Outcomes Short Term: Improve cardiorespiratory fitness to achieve a reduction of symptoms when performing ADLs;Long Term: Be able to perform more ADLs without symptoms or delay the onset of symptoms           Core Components/Risk Factors/Patient Goals Review:   Goals and Risk Factor Review    Row Name 03/20/20 0748 04/07/20 1333 05/05/20 0940         Core Components/Risk Factors/Patient Goals Review   Personal Goals Review Improve shortness of breath with ADL's -- Improve shortness of breath with ADL's     Review Patient is new to the program completing 2 sessions. His personal goals are to be able to walk to his mailbox and to get off of O2.  Will continue to monitor for progress as he works toward meeting his personal and program goals. Patient has completed 6 sessions losing 1 lb since last 30 day review. He is doing well in the program  with progression and consistent attendance. His personal goals are to breathe better; to be able to walk to his mailbox and to get off of O2. He feels he is making progress toward meeting his goals. Patient has completed 12 sessions gaining 1 lb since last 30 day review. He was referred to pulmonary rehab with pulmonary HTN. He continues to do well in the program with progression and consistent attendance. He works very hard during sessions and has started using the treadmill for the past 4 sessions tolerating very well. His  personal goals are to breathe better; be able to walk to his mailbox; and to get off of O2. He saw his pulmonologist 05/01/20 for a routine f/u visit. He reported to MD that he was feeling a lot stronger and was able to walk to his mail box and able to climb stairs. Will continue to monitor for progress.     Expected Outcomes Patient will complete the program meeting both personal and program goals. Patient will complete the program meeting both personal and program goals. Patient will complete the program meeting both personal and program goals.            Core Components/Risk Factors/Patient Goals at Discharge (Final Review):   Goals and Risk Factor Review - 05/05/20 0940      Core Components/Risk Factors/Patient Goals Review   Personal Goals Review Improve shortness of breath with ADL's    Review Patient has completed 12 sessions gaining 1 lb since last 30 day review. He was referred to pulmonary rehab with pulmonary HTN. He continues to do well in the program with progression and consistent attendance. He works very hard during sessions and has started using the treadmill for the past 4 sessions tolerating very well. His personal goals are to breathe better; be able to walk to his mailbox; and to get off of O2. He saw his pulmonologist 05/01/20 for a routine f/u visit. He reported to MD that he was feeling a lot stronger and was able to walk to his mail box and able to climb  stairs. Will continue to monitor for progress.    Expected Outcomes Patient will complete the program meeting both personal and program goals.           ITP Comments:   Comments: ITP REVIEW Pt is making expected progress toward pulmonary rehab goals after completing 12 sessions. Recommend continued exercise, life style modification, education, and utilization of breathing techniques to increase stamina and strength and decrease shortness of breath with exertion.

## 2020-05-08 NOTE — Progress Notes (Signed)
Daily Session Note  Patient Details  Name: Warren [name] MRN: [id number] Date of Birth: 1950-10-09 Referring Provider:   Flowsheet Row PULMONARY REHAB OTHER RESP ORIENTATION from 03/14/2020 in [place]  Referring Provider Dr. Haroldine Laws      Encounter Date: 05/08/2020  Check In:  Session Check In - 05/08/20 1330      Check-In   Supervising physician immediately available to respond to emergencies [facility] MD immediately available    Physician(s) Dr. Domenic Polite    Location [facility]    Staff Present Geanie Cooley, RN;Madison Audria Nine, MS, Exercise Physiologist;Ziair Penson Kris Mouton, MS, ACSM-CEP, Exercise Physiologist    Virtual Visit No    Medication changes reported     No    Fall or balance concerns reported    No    Tobacco Cessation No Change    Warm-up and Cool-down Performed as group-led instruction    Resistance Training Performed Yes    VAD Patient? No    PAD/SET Patient? No      Pain Assessment   Currently in Pain? No/denies    Pain Score 0-No pain    Multiple Pain Sites No           Capillary Blood Glucose: No results found for this or any previous visit (from the past 24 hour(s)).    Social History   Tobacco Use  Smoking Status Former Smoker  . Quit date: 05/24/2002  . Years since quitting: 17.9  Smokeless Tobacco Never Used    Goals Met:  Independence with exercise equipment Exercise tolerated well No report of cardiac concerns or symptoms Strength training completed today  Goals Unmet:  Not Applicable  Comments: checkout time is 1430   Dr. Kathie Dike is Medical Director for [facility].

## 2020-05-09 ENCOUNTER — Other Ambulatory Visit (HOSPITAL_COMMUNITY): Payer: Self-pay

## 2020-05-09 DIAGNOSIS — Z23 Encounter for immunization: Secondary | ICD-10-CM | POA: Diagnosis not present

## 2020-05-09 MED ORDER — SILDENAFIL CITRATE 20 MG PO TABS
40.0000 mg | ORAL_TABLET | Freq: Three times a day (TID) | ORAL | 6 refills | Status: DC
Start: 2020-05-09 — End: 2020-05-13

## 2020-05-13 ENCOUNTER — Encounter (HOSPITAL_COMMUNITY)
Admission: RE | Admit: 2020-05-13 | Discharge: 2020-05-13 | Disposition: A | Discharge status: Home / Self Care | Payer: Medicare Other | Source: Ambulatory Visit | Attending: Internal Medicine | Admitting: Internal Medicine

## 2020-05-13 ENCOUNTER — Other Ambulatory Visit (HOSPITAL_COMMUNITY): Payer: Self-pay | Admitting: Cardiology

## 2020-05-13 ENCOUNTER — Other Ambulatory Visit (HOSPITAL_COMMUNITY): Payer: Self-pay

## 2020-05-13 ENCOUNTER — Other Ambulatory Visit: Payer: Self-pay

## 2020-05-13 DIAGNOSIS — I272 Pulmonary hypertension, unspecified: Secondary | ICD-10-CM

## 2020-05-13 MED ORDER — SILDENAFIL CITRATE 20 MG PO TABS
40.0000 mg | ORAL_TABLET | Freq: Three times a day (TID) | ORAL | 11 refills | Status: DC
Start: 2020-05-13 — End: 2020-09-01

## 2020-05-13 MED ORDER — SILDENAFIL CITRATE 20 MG PO TABS
40.0000 mg | ORAL_TABLET | Freq: Three times a day (TID) | ORAL | 11 refills | Status: DC
Start: 2020-05-13 — End: 2020-05-13

## 2020-05-13 NOTE — Progress Notes (Signed)
Daily Session Note  Patient Details  Name: Warren [name] MRN: [id number] Date of Birth: 05-Apr-1951 Referring Provider:   [place] from 03/14/2020 in [place]  Referring Provider Dr. Haroldine Laws      Encounter Date: 05/13/2020  Check In:  Session Check In - 05/13/20 1330      Check-In   Supervising physician immediately available to respond to emergencies [facility] MD immediately available    Physician(s) Dr. Domenic Polite    Location [facility]    Staff Present Cathren Harsh, MS, Exercise Physiologist;Krosby Ritchie Kris Mouton, MS, ACSM-CEP, Exercise Physiologist    Virtual Visit No    Medication changes reported     No    Fall or balance concerns reported    No    Tobacco Cessation No Change    Warm-up and Cool-down Performed as group-led instruction    Resistance Training Performed Yes    VAD Patient? No    PAD/SET Patient? No      Pain Assessment   Currently in Pain? No/denies    Pain Score 0-No pain    Multiple Pain Sites No           Capillary Blood Glucose: No results found for this or any previous visit (from the past 24 hour(s)).    Social History   Tobacco Use  Smoking Status Former Smoker  . Quit date: 05/24/2002  . Years since quitting: 17.9  Smokeless Tobacco Never Used    Goals Met:  Independence with exercise equipment Exercise tolerated well No report of cardiac concerns or symptoms Strength training completed today  Goals Unmet:  Not Applicable  Comments: checkout time is 1430   Dr. Kathie Dike is Medical Director for [facility].

## 2020-05-15 ENCOUNTER — Other Ambulatory Visit: Payer: Self-pay

## 2020-05-15 ENCOUNTER — Encounter (HOSPITAL_COMMUNITY)
Admission: RE | Admit: 2020-05-15 | Discharge: 2020-05-15 | Disposition: A | Discharge status: Home / Self Care | Payer: Medicare Other | Source: Ambulatory Visit | Attending: Internal Medicine | Admitting: Internal Medicine

## 2020-05-15 DIAGNOSIS — I272 Pulmonary hypertension, unspecified: Secondary | ICD-10-CM

## 2020-05-15 NOTE — Progress Notes (Signed)
Daily Session Note  Patient Details  Name: Warren [name] MRN: [id number] Date of Birth: [birth year] Referring Provider:   [place] from 03/14/2020 in [place]  Referring Provider Dr. Haroldine Laws      Encounter Date: 05/15/2020  Check In:  Session Check In - 05/15/20 1335      Check-In   Supervising physician immediately available to respond to emergencies [facility] MD immediately available    Physician(s) Dr. Domenic Polite    Location [facility]    Staff Present Aundra Dubin, RN, BSN;Madison Audria Nine, MS, Exercise Physiologist    Virtual Visit No    Medication changes reported     No    Fall or balance concerns reported    No    Tobacco Cessation No Change    Warm-up and Cool-down Performed as group-led instruction    Resistance Training Performed Yes    VAD Patient? No    PAD/SET Patient? No      Pain Assessment   Currently in Pain? No/denies    Pain Score 0-No pain           Capillary Blood Glucose: No results found for this or any previous visit (from the past 24 hour(s)).    Social History   Tobacco Use  Smoking Status Former Smoker  . Quit date: 05/24/2002  . Years since quitting: 17.9  Smokeless Tobacco Never Used    Goals Met:  Proper associated with RPD/PD & O2 Sat Independence with exercise equipment Improved SOB with ADL's Using PLB without cueing & demonstrates good technique Exercise tolerated well No report of cardiac concerns or symptoms Strength training completed today  Goals Unmet:  Not Applicable  Comments: Check out 1430.   Dr. Kathie Dike is Medical Director for [facility].

## 2020-05-20 ENCOUNTER — Other Ambulatory Visit: Payer: Self-pay

## 2020-05-20 ENCOUNTER — Encounter (HOSPITAL_COMMUNITY)
Admission: RE | Admit: 2020-05-20 | Discharge: 2020-05-20 | Disposition: A | Discharge status: Home / Self Care | Payer: Medicare Other | Source: Ambulatory Visit | Attending: Internal Medicine | Admitting: Internal Medicine

## 2020-05-20 VITALS — Wt 194.4 lb

## 2020-05-20 DIAGNOSIS — I272 Pulmonary hypertension, unspecified: Secondary | ICD-10-CM

## 2020-05-20 NOTE — Progress Notes (Signed)
Daily Session Note  Patient Details  Name: Warren [name] MRN: [id number] Date of Birth: Feb 20, 1951 Referring Provider:   [place] from 03/14/2020 in [facility]  Referring Provider Dr. Haroldine Laws      Encounter Date: 05/20/2020  Check In:  Session Check In - 05/20/20 1328      Check-In   Supervising physician immediately available to respond to emergencies [facility] MD immediately available    Physician(s) Harrington Challenger    Location [facility]    Staff Present Geanie Cooley, RN;Dalton Kris Mouton, MS, ACSM-CEP, Exercise Physiologist    Virtual Visit No    Medication changes reported     No    Fall or balance concerns reported    No    Tobacco Cessation No Change    Warm-up and Cool-down Performed as group-led instruction    Resistance Training Performed Yes    VAD Patient? No    PAD/SET Patient? No      Pain Assessment   Currently in Pain? No/denies    Pain Score 0-No pain    Multiple Pain Sites No           Capillary Blood Glucose: No results found for this or any previous visit (from the past 24 hour(s)).    Social History   Tobacco Use  Smoking Status Former Smoker  . Quit date: 05/24/2002  . Years since quitting: 18.0  Smokeless Tobacco Never Used    Goals Met:  Proper associated with RPD/PD & O2 Sat Independence with exercise equipment Improved SOB with ADL's Exercise tolerated well No report of cardiac concerns or symptoms Strength training completed today  Goals Unmet:  Not Applicable  Comments: check out @ 14:30   Dr. Kathie Dike is Medical Director for [facility].

## 2020-05-22 ENCOUNTER — Encounter (HOSPITAL_COMMUNITY)
Admission: RE | Admit: 2020-05-22 | Discharge: 2020-05-22 | Disposition: A | Discharge status: Home / Self Care | Payer: Medicare Other | Source: Ambulatory Visit | Attending: Internal Medicine | Admitting: Internal Medicine

## 2020-05-22 ENCOUNTER — Other Ambulatory Visit: Payer: Self-pay

## 2020-05-22 DIAGNOSIS — I272 Pulmonary hypertension, unspecified: Secondary | ICD-10-CM | POA: Diagnosis not present

## 2020-05-22 NOTE — Progress Notes (Signed)
Daily Session Note  Patient Details  Name: Warren [name] MRN: [id number] Date of Birth: 09-Sep-1950 Referring Provider:   [place] from 03/14/2020 in [place]  Referring Provider Dr. Haroldine Laws      Encounter Date: 05/22/2020  Check In:  Session Check In - 05/22/20 1330      Check-In   Supervising physician immediately available to respond to emergencies [facility] MD immediately available    Physician(s) Harrington Challenger    Staff Present Geanie Cooley, RN;Dalton Fletcher, MS, ACSM-CEP, Exercise Physiologist    Virtual Visit No    Medication changes reported     No    Fall or balance concerns reported    No    Tobacco Cessation No Change    Warm-up and Cool-down Performed as group-led instruction    Resistance Training Performed Yes    VAD Patient? No    PAD/SET Patient? No      Pain Assessment   Currently in Pain? No/denies    Pain Score 0-No pain    Multiple Pain Sites No           Capillary Blood Glucose: No results found for this or any previous visit (from the past 24 hour(s)).    Social History   Tobacco Use  Smoking Status Former Smoker  . Quit date: 05/24/2002  . Years since quitting: 18.0  Smokeless Tobacco Never Used    Goals Met:  Proper associated with RPD/PD & O2 Sat Independence with exercise equipment Exercise tolerated well No report of cardiac concerns or symptoms Strength training completed today  Goals Unmet:  Not Applicable  Comments: check out @ 14:30   Dr. Kathie Dike is Medical Director for [facility].

## 2020-05-27 ENCOUNTER — Encounter (HOSPITAL_COMMUNITY)
Admission: RE | Admit: 2020-05-27 | Discharge: 2020-05-27 | Disposition: A | Discharge status: Home / Self Care | Payer: Medicare Other | Source: Ambulatory Visit | Attending: Internal Medicine | Admitting: Internal Medicine

## 2020-05-27 ENCOUNTER — Other Ambulatory Visit: Payer: Self-pay

## 2020-05-27 DIAGNOSIS — I272 Pulmonary hypertension, unspecified: Secondary | ICD-10-CM | POA: Insufficient documentation

## 2020-05-27 NOTE — Progress Notes (Signed)
Daily Session Note  Patient Details  Name: Warren [name] MRN: [id number] Date of Birth: 1951-03-07 Referring Provider:   Flowsheet Row PULMONARY REHAB OTHER RESP ORIENTATION from 03/14/2020 in [place]  Referring Provider Dr. Haroldine Laws      Encounter Date: 05/27/2020  Check In:  Session Check In - 05/27/20 1330      Check-In   Supervising physician immediately available to respond to emergencies [facility] immediately available    Physician(s) Dr. Domenic Polite    Location [facility]    Staff Present Geanie Cooley, RN;Jakyia Gaccione Kris Mouton, MS, ACSM-CEP, Exercise Physiologist;Madison Audria Nine, MS, Exercise Physiologist    Virtual Visit No    Medication changes reported     No    Fall or balance concerns reported    No    Tobacco Cessation No Change    Warm-up and Cool-down Performed as group-led instruction    Resistance Training Performed Yes    VAD Patient? No    PAD/SET Patient? No      Pain Assessment   Currently in Pain? No/denies    Pain Score 0-No pain    Multiple Pain Sites No           Capillary Blood Glucose: No results found for this or any previous visit (from the past 24 hour(s)).    Social History   Tobacco Use  Smoking Status Former Smoker  . Quit date: 05/24/2002  . Years since quitting: 18.0  Smokeless Tobacco Never Used    Goals Met:  Independence with exercise equipment Exercise tolerated well No report of cardiac concerns or symptoms Strength training completed today  Goals Unmet:  Not Applicable  Comments: checkout time is 1430   Dr. Kathie Dike is Medical Director for [facility] Pulmonary Rehab.

## 2020-05-28 ENCOUNTER — Ambulatory Visit: Payer: Medicare Other

## 2020-05-28 DIAGNOSIS — G4733 Obstructive sleep apnea (adult) (pediatric): Secondary | ICD-10-CM

## 2020-05-28 NOTE — Procedures (Signed)
   Sleep Study Report  Patient Information  Name: Warren [name]  ID: [id number] Birth Date: 09-02-1950  Age: 70 Gender: Male Study Date:05/03/2020 Referring Physician Information:     TEST DESCRIPTION: Home sleep apnea testing was completed using the WatchPat, a Type 1 device, utilizing peripheral arterial tonometry (PAT), chest movement, actigraphy, pulse oximetry, pulse rate, body position and snore. AHI was calculated with apnea and hypopnea using valid sleep time as the denominator. RDI includes apneas, hypopneas, and RERAs. The data acquired and the scoring of sleep and all associated events were performed in accordance with the recommended standards and specifications as outlined in the AASM Manual for the Scoring of Sleep and Associated Events 2.2.0 (2015).  FINDINGS: 1. Minimal Obstructive Sleep Apnea with AHI 5.1/hr and during REM sleep 11.9/hr. 2. No Central Sleep Apnea with pAHIc 0 /hr. 3. Oxygen desaturations as low as 77%. 4. Mild snoring was present. O2 sats were < 88% for 32. . 5. Total sleep time was 7hrs and 4 min. 6. 24.8% of total sleep time was spent in the REM sleep.  7. Normal sleep onset latency at 20 min.  8. Shortened REM sleep onset latency at 62 min.  9. Total awakenings were 4.   DIAGNOSIS:  Mild Obstructive Sleep Apnea (G47.33) Nocturnal Hypoxemia  Recommendations  1. Clinical correlation of these findings is necessary. The decision to treat obstructive sleep apnea (OSA) is usually based on the presence of apnea symptoms or the presence of associated medical conditions such as Hypertension, Congestive Heart Failure, Atrial Fibrillation or Obesity. The most common symptoms of OSA are snoring, gasping for breath while sleeping, daytime sleepiness and fatigue.   2. Initiating apnea therapy is recommended given the presence of symptoms and/or associated conditions.   Recommend proceeding with one of the following:   a. Auto-CPAP therapy with a  pressure range of 5-20cm H2O.   b. An oral appliance (OA) that can be obtained from certain dentists with expertise in sleep medicine. These are primarily of use in non-obese patients with mild and moderate disease.   c. An ENT consultation which may be useful to look for specific causes of obstruction and possible treatment options.   d. If patient is intolerant to PAP therapy, consider referral to ENT for evaluation for hypoglossal nerve stimulator.   3. Close follow-up is necessary to ensure success with CPAP or oral appliance therapy for maximum benefit .  4. A follow-up oximetry study on CPAP is recommended to assess the adequacy of therapy and determine the need for supplemental oxygen or the potential need for Bi-level therapy. An arterial blood gas to determine the adequacy of baseline ventilation and oxygenation should also be considered.  5. Healthy sleep recommendations include: adequate nightly sleep (normal 7-9 hrs/night), avoidance of caffeine afternoon and alcohol near bedtime, and maintaining a sleep environment that is cool, dark and quiet.  6. Weight loss for overweight patients is recommended. Even modest amounts of weight loss can significantly improve the severity of sleep apnea.  7. Snoring recommendations include: weight loss where appropriate, side sleeping, and avoidance of alcohol before bed.  8. Operation of motor vehicle or dangerous equipment must be avoided when feeling drowsy, excessively sleepy, or mentally fatigued.  Report prepared by: Signature: Armanda Magic, MD Diplomate American Board of Sleep Medicine  Electronically Signed: May 28, 2020

## 2020-05-29 ENCOUNTER — Other Ambulatory Visit: Payer: Self-pay

## 2020-05-29 ENCOUNTER — Encounter (HOSPITAL_COMMUNITY)
Admission: RE | Admit: 2020-05-29 | Discharge: 2020-05-29 | Disposition: A | Discharge status: Home / Self Care | Payer: Medicare Other | Source: Ambulatory Visit | Attending: Internal Medicine | Admitting: Internal Medicine

## 2020-05-29 DIAGNOSIS — I272 Pulmonary hypertension, unspecified: Secondary | ICD-10-CM | POA: Diagnosis not present

## 2020-05-29 NOTE — Progress Notes (Signed)
Daily Session Note  Patient Details  Name: Warren [name] MRN: [id number] Date of Birth: Dec 06, 1950 Referring Provider:   Flowsheet Row PULMONARY REHAB OTHER RESP ORIENTATION from 03/14/2020 in [place]  Referring Provider Dr. Haroldine Laws      Encounter Date: 05/29/2020  Check In:  Session Check In - 05/29/20 1330      Check-In   Supervising physician immediately available to respond to emergencies [facility] MD immediately available    Physician(s) Domenic Polite    Location [facility]    Staff Present Geanie Cooley, RN;Madison Audria Nine, MS, Exercise Physiologist    Virtual Visit No    Medication changes reported     No    Fall or balance concerns reported    No    Tobacco Cessation No Change    Warm-up and Cool-down Performed as group-led instruction    Resistance Training Performed Yes    VAD Patient? No    PAD/SET Patient? No      Pain Assessment   Currently in Pain? No/denies    Pain Score 0-No pain    Multiple Pain Sites No           Capillary Blood Glucose: No results found for this or any previous visit (from the past 24 hour(s)).    Social History   Tobacco Use  Smoking Status Former Smoker  . Quit date: 05/24/2002  . Years since quitting: 18.0  Smokeless Tobacco Never Used    Goals Met:  Proper associated with RPD/PD & O2 Sat Independence with exercise equipment Exercise tolerated well No report of cardiac concerns or symptoms Strength training completed today  Goals Unmet:  Not Applicable  Comments: check out @ 14:30   Dr. Kathie Dike is Medical Director for [facility] Pulmonary Re[facility].

## 2020-06-03 ENCOUNTER — Encounter (HOSPITAL_COMMUNITY)
Admission: RE | Admit: 2020-06-03 | Discharge: 2020-06-03 | Disposition: A | Discharge status: Home / Self Care | Payer: Medicare Other | Source: Ambulatory Visit | Attending: Internal Medicine | Admitting: Internal Medicine

## 2020-06-03 ENCOUNTER — Other Ambulatory Visit: Payer: Self-pay

## 2020-06-03 ENCOUNTER — Encounter (HOSPITAL_BASED_OUTPATIENT_CLINIC_OR_DEPARTMENT_OTHER): Payer: Medicare Other | Admitting: Cardiology

## 2020-06-03 VITALS — Wt 190.5 lb

## 2020-06-03 DIAGNOSIS — G4734 Idiopathic sleep related nonobstructive alveolar hypoventilation: Secondary | ICD-10-CM | POA: Diagnosis not present

## 2020-06-03 DIAGNOSIS — G4733 Obstructive sleep apnea (adult) (pediatric): Secondary | ICD-10-CM | POA: Diagnosis not present

## 2020-06-03 DIAGNOSIS — I272 Pulmonary hypertension, unspecified: Secondary | ICD-10-CM

## 2020-06-03 NOTE — Progress Notes (Signed)
Daily Session Note  Patient Details  Name: Warren [name] MRN: [id number] Date of Birth: 07-11-1950 Referring Provider:   [place] from 03/14/2020 in [place]  Referring Provider Dr. Haroldine Laws      Encounter Date: 06/03/2020  Check In:  Session Check In - 06/03/20 1330      Check-In   Supervising physician immediately available to respond to emergencies [facility] MD immediately available    Physician(s) Domenic Polite    Location [facility]    Staff Present Geanie Cooley, RN;Madison Audria Nine, MS, Exercise Physiologist;Daymeon Fischman Kris Mouton, MS, ACSM-CEP, Exercise Physiologist    Virtual Visit No    Medication changes reported     No    Fall or balance concerns reported    No    Tobacco Cessation No Change    Warm-up and Cool-down Performed as group-led instruction    Resistance Training Performed Yes    VAD Patient? No    PAD/SET Patient? No      Pain Assessment   Currently in Pain? No/denies    Pain Score 0-No pain    Multiple Pain Sites No           Capillary Blood Glucose: No results found for this or any previous visit (from the past 24 hour(s)).    Social History   Tobacco Use  Smoking Status Former Smoker  . Quit date: 05/24/2002  . Years since quitting: 18.0  Smokeless Tobacco Never Used    Goals Met:  Independence with exercise equipment Exercise tolerated well No report of cardiac concerns or symptoms Strength training completed today  Goals Unmet:  Not Applicable  Comments: checkout time is 1430   Dr. Kathie Dike is Medical Director for [facility] Re[facility].

## 2020-06-04 ENCOUNTER — Telehealth: Payer: Self-pay | Admitting: *Deleted

## 2020-06-04 DIAGNOSIS — G4733 Obstructive sleep apnea (adult) (pediatric): Secondary | ICD-10-CM

## 2020-06-04 NOTE — Telephone Encounter (Signed)
Informed patient of sleep study results and patient understanding was verbalized. Patient understands her sleep study showed that they have sleep apnea and recommend CPAP titration. Please set up titration in the sleep lab.   Pt is  agreeable to his results.  Titration sent to sleep pool.

## 2020-06-04 NOTE — Telephone Encounter (Signed)
-----   Message from Sueanne Margarita, MD sent at 05/28/2020  8:35 PM EST ----- Please let patient know that they have sleep apnea and recommend CPAP titration. Please set up titration in the sleep lab.

## 2020-06-05 ENCOUNTER — Other Ambulatory Visit: Payer: Self-pay

## 2020-06-05 ENCOUNTER — Encounter (HOSPITAL_COMMUNITY)
Admission: RE | Admit: 2020-06-05 | Discharge: 2020-06-05 | Disposition: A | Discharge status: Home / Self Care | Payer: Medicare Other | Source: Ambulatory Visit | Attending: Internal Medicine | Admitting: Internal Medicine

## 2020-06-05 DIAGNOSIS — I272 Pulmonary hypertension, unspecified: Secondary | ICD-10-CM

## 2020-06-05 NOTE — Progress Notes (Signed)
Daily Session Note  Patient Details  Name: Warren [name] MRN: [id number] Date of Birth: Jan 04, 1951 Referring Provider:   Flowsheet Row PULMONARY REHAB OTHER RESP ORIENTATION from 03/14/2020 in [place]  Referring Provider Dr. Haroldine Laws      Encounter Date: 06/05/2020  Check In:  Session Check In - 06/05/20 1330      Check-In   Supervising physician immediately available to respond to emergencies [facility] MD immediately available    Physician(s) Dr. Harl Bowie    Location [facility]    Staff Present Geanie Cooley, RN;Madison Audria Nine, MS, Exercise Physiologist;Marionette Meskill Kris Mouton, MS, ACSM-CEP, Exercise Physiologist    Virtual Visit No    Medication changes reported     No    Fall or balance concerns reported    No    Tobacco Cessation No Change    Warm-up and Cool-down Performed as group-led instruction    Resistance Training Performed Yes    VAD Patient? No    PAD/SET Patient? No      Pain Assessment   Currently in Pain? No/denies    Pain Score 0-No pain    Multiple Pain Sites No           Capillary Blood Glucose: No results found for this or any previous visit (from the past 24 hour(s)).    Social History   Tobacco Use  Smoking Status Former Smoker  . Quit date: 05/24/2002  . Years since quitting: 18.0  Smokeless Tobacco Never Used    Goals Met:  Independence with exercise equipment Exercise tolerated well No report of cardiac concerns or symptoms Strength training completed today  Goals Unmet:  Not Applicable  Comments: checkout time is 1430   Dr. Kathie Dike is Medical Director for [facility] Pulmonary Rehab.

## 2020-06-05 NOTE — Progress Notes (Signed)
Pulmonary Individual Treatment Plan  Patient Details  Name: Warren [name] MRN: [id number] Date of Birth: 10/12/1950 Referring Provider:   [place] from 03/14/2020 in [place]  Referring Provider Dr. Haroldine Laws      Initial Encounter Date:  Flowsheet Row PULMONARY REHAB OTHER RESP ORIENTATION from 03/14/2020 in [place]  Date 03/14/20      Visit Diagnosis: Pulmonary hypertension, unspecified ([place])  Patient's Home Medications on Admission:   Current Outpatient Medications:  .  acetaminophen (TYLENOL) 500 MG tablet, Take 1,000 mg by mouth every 6 (six) hours as needed for moderate pain or headache., Disp: , Rfl:  .  aspirin EC 81 MG tablet, Take 1 tablet (81 mg total) by mouth daily., Disp: 90 tablet, Rfl: 3 .  ezetimibe (ZETIA) 10 MG tablet, Take 10 mg by mouth daily., Disp: , Rfl:  .  hydrochlorothiazide (HYDRODIURIL) 25 MG tablet, Take 25 mg by mouth daily. , Disp: , Rfl:  .  macitentan (OPSUMIT) 10 MG tablet, Take 1 tablet (10 mg total) by mouth daily., Disp: 30 tablet, Rfl: 11 .  Menthol-Methyl Salicylate (MUSCLE RUB) 10-15 % CREA, Apply 1 application topically as needed for muscle pain., Disp: , Rfl:  .  OXYGEN, Inhale 2-3 L into the lungs continuous. 2 L at rest and with activity 3 L, Disp: , Rfl:  .  potassium chloride (KLOR-CON) 10 MEQ tablet, Take 2 tablets (20 mEq total) by mouth daily., Disp: 60 tablet, Rfl: 3 .  rosuvastatin (CRESTOR) 5 MG tablet, Take 1 tablet (5 mg total) by mouth daily., Disp: 30 tablet, Rfl: 3 .  sildenafil (REVATIO) 20 MG tablet, Take 2 tablets (40 mg total) by mouth 3 (three) times daily., Disp: 180 tablet, Rfl: 11 No current facility-administered medications for this encounter.  Facility-Administered Medications Ordered in Other Encounters:  .  sodium chloride flush (NS) 0.9 % injection 10 mL, 10 mL, Intravenous, PRN, [name], Cassie Freer, MD, 20 mL at  12/05/19 1125  Past Medical History: Past Medical History:  Diagnosis Date  . CHF (congestive heart failure) ([place])   . Coronary artery disease   . Hyperlipidemia   . Hypertension     Tobacco Use: Social History   Tobacco Use  Smoking Status Former Smoker  . Quit date: 05/24/2002  . Years since quitting: 18.0  Smokeless Tobacco Never Used    Labs: Recent Review Flowsheet Data    Labs for ITP Cardiac and Pulmonary Rehab Latest Ref Rng & Units 11/15/2008 11/16/2008 12/14/2019 12/14/2019 12/14/2019   Cholestrol 0 - 200 mg/dL - 133 - - -   LDLCALC 0 - 99 mg/dL - 68 - - -   HDL >39 mg/dL - 41 - - -   Trlycerides <150 mg/dL - 120 - - -   Hemoglobin A1c % 5.5 - - - -   PHART 7.350 - 7.450 - - 7.421 - -   PCO2ART 32.0 - 48.0 mmHg - - 37.7 - -   HCO3 20.0 - 28.0 mmol/L - - 24.5 20.8 27.0   TCO2 22 - 32 mmol/L - - 26 22 28    ACIDBASEDEF 0.0 - 2.0 mmol/L - - - 4.0(H) -   O2SAT % - - 92.0 69.0 71.0      Capillary Blood Glucose: No results found for: GLUCAP   Pulmonary Assessment Scores:  Pulmonary Assessment Scores    Row Name 03/14/20 Jones  SOB Score total 24           CAT Score   CAT Score 17           mMRC Score   mMRC Score 4           UCSD: Self-administered rating of dyspnea associated with activities of daily living (ADLs) 6-point scale (0 = "not at all" to 5 = "maximal or unable to do because of breathlessness")  Scoring Scores range from 0 to 120.  Minimally important difference is 5 units  CAT: CAT can identify the health impairment of COPD patients and is better correlated with disease progression.  CAT has a scoring range of zero to 40. The CAT score is classified into four groups of low (less than 10), medium (10 - 20), high (21-30) and very high (31-40) based on the impact level of disease on health status. A CAT score over 10 suggests significant symptoms.  A worsening CAT score could be explained by an exacerbation, poor medication  adherence, poor inhaler technique, or progression of COPD or comorbid conditions.  CAT MCID is 2 points  mMRC: mMRC (Modified Medical Research Council) Dyspnea Scale is used to assess the degree of baseline functional disability in patients of respiratory disease due to dyspnea. No minimal important difference is established. A decrease in score of 1 point or greater is considered a positive change.   Pulmonary Function Assessment:  Pulmonary Function Assessment - 03/14/20 1309      Breath   Shortness of Breath Yes;Limiting activity           Exercise Target Goals: Exercise Program Goal: Individual exercise prescription set using results from initial 6 min walk test and THRR while considering  patient's activity barriers and safety.   Exercise Prescription Goal: Initial exercise prescription builds to 30-45 minutes a day of aerobic activity, 2-3 days per week.  Home exercise guidelines will be given to patient during program as part of exercise prescription that the participant will acknowledge.  Activity Barriers & Risk Stratification:  Activity Barriers & Cardiac Risk Stratification - 03/14/20 1302      Activity Barriers & Cardiac Risk Stratification   Activity Barriers Arthritis;Back Problems;Neck/Spine Problems;Joint Problems;Deconditioning;Muscular Weakness;Shortness of Breath    Cardiac Risk Stratification Moderate           6 Minute Walk:  6 Minute Walk    Row Name 03/14/20 1449         6 Minute Walk   Phase Initial     Distance 950 feet     Walk Time 6 minutes     # of Rest Breaks 1     MPH 1.8     METS 2.61     RPE 13     Perceived Dyspnea  15     VO2 Peak 9.13     Symptoms Yes (comment)     Comments Pt stopped for 1 min 15 seconds to allow for SpO2 to increase to >80%     Resting HR 70 bpm     Resting BP 96/64     Resting Oxygen Saturation  96 %     Exercise Oxygen Saturation  during 6 min walk 78 %     Max Ex. HR 115 bpm     Max Ex. BP 144/82      2 Minute Post BP 110/56           Interval HR   1 Minute HR 84  2 Minute HR 114     3 Minute HR 95     4 Minute HR 108     5 Minute HR 115     6 Minute HR 115     2 Minute Post HR 88     Interval Heart Rate? Yes           Interval Oxygen   Interval Oxygen? Yes     Baseline Oxygen Saturation % 96 %     1 Minute Oxygen Saturation % 87 %     1 Minute Liters of Oxygen 4 L     2 Minute Oxygen Saturation % 78 %     2 Minute Liters of Oxygen 6 L     3 Minute Oxygen Saturation % 82 %     3 Minute Liters of Oxygen 8 L     4 Minute Oxygen Saturation % 85 %     4 Minute Liters of Oxygen 8 L     5 Minute Oxygen Saturation % 81 %     5 Minute Liters of Oxygen 8 L     6 Minute Oxygen Saturation % 78 %     6 Minute Liters of Oxygen 8 L     2 Minute Post Oxygen Saturation % 89 %     2 Minute Post Liters of Oxygen 8 L            Oxygen Initial Assessment:  Oxygen Initial Assessment - 03/14/20 1428      Initial 6 min Walk   Oxygen Used Continuous    Liters per minute 8      Program Oxygen Prescription   Program Oxygen Prescription Continuous    Liters per minute 8      Intervention   Short Term Goals To learn and exhibit compliance with exercise, home and travel O2 prescription;To learn and understand importance of monitoring SPO2 with pulse oximeter and demonstrate accurate use of the pulse oximeter.;To learn and demonstrate proper use of respiratory medications;To learn and demonstrate proper pursed lip breathing techniques or other breathing techniques.;To learn and understand importance of maintaining oxygen saturations>88%    Long  Term Goals Exhibits compliance with exercise, home and travel O2 prescription;Verbalizes importance of monitoring SPO2 with pulse oximeter and return demonstration;Maintenance of O2 saturations>88%;Exhibits proper breathing techniques, such as pursed lip breathing or other method taught during program session;Compliance with respiratory  medication;Demonstrates proper use of MDI's           Oxygen Re-Evaluation:  Oxygen Re-Evaluation    Row Name 03/18/20 1443 04/08/20 1542 05/06/20 1528 06/03/20 1557       Program Oxygen Prescription   Program Oxygen Prescription Continuous Continuous Continuous Continuous    Liters per minute 8 8 8 8     Comments - - - sometimes 10 on treadmill         Home Oxygen   Home Oxygen Device E-Tanks;Home Concentrator - E-Tanks;Home Concentrator E-Tanks;Home Concentrator    Sleep Oxygen Prescription Continuous - Continuous Continuous    Liters per minute 3 - - 3    Home Exercise Oxygen Prescription Continuous Continuous Continuous Continuous    Liters per minute 8 8 - 8    Home Resting Oxygen Prescription Continuous Continuous Continuous Continuous    Liters per minute 3 3 - 3    Compliance with Home Oxygen Use Yes Yes Yes Yes         Goals/Expected Outcomes   Short Term Goals To learn  and exhibit compliance with exercise, home and travel O2 prescription;To learn and understand importance of monitoring SPO2 with pulse oximeter and demonstrate accurate use of the pulse oximeter.;To learn and demonstrate proper use of respiratory medications;To learn and demonstrate proper pursed lip breathing techniques or other breathing techniques.;To learn and understand importance of maintaining oxygen saturations>88% To learn and exhibit compliance with exercise, home and travel O2 prescription;To learn and understand importance of monitoring SPO2 with pulse oximeter and demonstrate accurate use of the pulse oximeter.;To learn and understand importance of maintaining oxygen saturations>88%;To learn and demonstrate proper pursed lip breathing techniques or other breathing techniques.;To learn and demonstrate proper use of respiratory medications To learn and exhibit compliance with exercise, home and travel O2 prescription;To learn and understand importance of monitoring SPO2 with pulse oximeter and  demonstrate accurate use of the pulse oximeter.;To learn and understand importance of maintaining oxygen saturations>88%;To learn and demonstrate proper pursed lip breathing techniques or other breathing techniques.;To learn and demonstrate proper use of respiratory medications To learn and exhibit compliance with exercise, home and travel O2 prescription;To learn and understand importance of monitoring SPO2 with pulse oximeter and demonstrate accurate use of the pulse oximeter.;To learn and understand importance of maintaining oxygen saturations>88%;To learn and demonstrate proper pursed lip breathing techniques or other breathing techniques.;To learn and demonstrate proper use of respiratory medications    Long  Term Goals Exhibits compliance with exercise, home and travel O2 prescription;Verbalizes importance of monitoring SPO2 with pulse oximeter and return demonstration;Maintenance of O2 saturations>88%;Exhibits proper breathing techniques, such as pursed lip breathing or other method taught during program session;Compliance with respiratory medication;Demonstrates proper use of MDI's Demonstrates proper use of MDI's;Compliance with respiratory medication;Exhibits proper breathing techniques, such as pursed lip breathing or other method taught during program session;Maintenance of O2 saturations>88%;Verbalizes importance of monitoring SPO2 with pulse oximeter and return demonstration;Exhibits compliance with exercise, home and travel O2 prescription Exhibits compliance with exercise, home and travel O2 prescription;Verbalizes importance of monitoring SPO2 with pulse oximeter and return demonstration;Maintenance of O2 saturations>88%;Compliance with respiratory medication;Demonstrates proper use of MDI's;Exhibits proper breathing techniques, such as pursed lip breathing or other method taught during program session Exhibits compliance with exercise, home and travel O2 prescription;Verbalizes importance of  monitoring SPO2 with pulse oximeter and return demonstration;Maintenance of O2 saturations>88%;Compliance with respiratory medication;Demonstrates proper use of MDI's;Exhibits proper breathing techniques, such as pursed lip breathing or other method taught during program session    Goals/Expected Outcomes compliance Compliance Compliance Compliance           Oxygen Discharge (Final Oxygen Re-Evaluation):  Oxygen Re-Evaluation - 06/03/20 1557      Program Oxygen Prescription   Program Oxygen Prescription Continuous    Liters per minute 8    Comments sometimes 10 on treadmill      Home Oxygen   Home Oxygen Device E-Tanks;Home Concentrator    Sleep Oxygen Prescription Continuous    Liters per minute 3    Home Exercise Oxygen Prescription Continuous    Liters per minute 8    Home Resting Oxygen Prescription Continuous    Liters per minute 3    Compliance with Home Oxygen Use Yes      Goals/Expected Outcomes   Short Term Goals To learn and exhibit compliance with exercise, home and travel O2 prescription;To learn and understand importance of monitoring SPO2 with pulse oximeter and demonstrate accurate use of the pulse oximeter.;To learn and understand importance of maintaining oxygen saturations>88%;To learn and demonstrate proper pursed lip breathing techniques or  other breathing techniques.;To learn and demonstrate proper use of respiratory medications    Long  Term Goals Exhibits compliance with exercise, home and travel O2 prescription;Verbalizes importance of monitoring SPO2 with pulse oximeter and return demonstration;Maintenance of O2 saturations>88%;Compliance with respiratory medication;Demonstrates proper use of MDI's;Exhibits proper breathing techniques, such as pursed lip breathing or other method taught during program session    Goals/Expected Outcomes Compliance           Initial Exercise Prescription:  Initial Exercise Prescription - 03/14/20 1400      Date of Initial  Exercise RX and Referring Provider   Date 03/14/20    Referring Provider Dr. Haroldine Laws    Expected Discharge Date 07/18/20      Oxygen   Oxygen Continuous    Liters 8      NuStep   Level 1    SPM 80    Minutes 39      Prescription Details   Frequency (times per week) 2    Duration Progress to 30 minutes of continuous aerobic without signs/symptoms of physical distress      Intensity   THRR 40-80% of Max Heartrate 61-122    Ratings of Perceived Exertion 11-13    Perceived Dyspnea 0-4      Resistance Training   Training Prescription Yes    Weight 2 lbs    Reps 10-15           Perform Capillary Blood Glucose checks as needed.  Exercise Prescription Changes:  Exercise Prescription Changes    Row Name 03/18/20 1400 04/01/20 1500 04/08/20 1400 04/22/20 1500 05/06/20 1500     Response to Exercise   Blood Pressure (Admit) 112/70 108/84 112/84 114/82 110/62   Blood Pressure (Exercise) 130/82 128/76 152/70 136/82 120/68   Blood Pressure (Exit) 102/62 102/70 112/70 122/84 118/76   Heart Rate (Admit) 82 bpm 95 bpm 92 bpm 89 bpm 90 bpm   Heart Rate (Exercise) 92 bpm 119 bpm 106 bpm 103 bpm 104 bpm   Heart Rate (Exit) 82 bpm 108 bpm 89 bpm 80 bpm 87 bpm   Oxygen Saturation (Admit) 92 % 94 % 92 % 97 % 94 %   Oxygen Saturation (Exercise) 91 % 86 % 89 % 91 % 88 %   Oxygen Saturation (Exit) 96 % 97 % 98 % 96 % 97 %   Rating of Perceived Exertion (Exercise) 9 12 12 11 10    Perceived Dyspnea (Exercise) 11 12 12 11 11    Duration Continue with 30 min of aerobic exercise without signs/symptoms of physical distress. Continue with 30 min of aerobic exercise without signs/symptoms of physical distress. Continue with 30 min of aerobic exercise without signs/symptoms of physical distress. Continue with 30 min of aerobic exercise without signs/symptoms of physical distress. Continue with 30 min of aerobic exercise without signs/symptoms of physical distress.   Intensity THRR unchanged THRR  unchanged THRR unchanged THRR unchanged THRR unchanged     Progression   Progression Continue to progress workloads to maintain intensity without signs/symptoms of physical distress. Continue to progress workloads to maintain intensity without signs/symptoms of physical distress. Continue to progress workloads to maintain intensity without signs/symptoms of physical distress. Continue to progress workloads to maintain intensity without signs/symptoms of physical distress. Continue to progress workloads to maintain intensity without signs/symptoms of physical distress.     Resistance Training   Training Prescription Yes Yes Yes Yes Yes   Weight 2 lbs 3 lbs 3 lbs 3 lbs 3 lbs  Reps 10-15 10-15 10-15 10-15 10-15   Time 10 Minutes 10 Minutes 10 Minutes 10 Minutes 10 Minutes     Oxygen   Oxygen Continuous Continuous Continuous Continuous Continuous   Liters 8 8 8 8 8      Treadmill   MPH - - - 1.4 1.6   Grade - - - 0 0   Minutes - - - 17 17   METs - - - 2.1 2.2     NuStep   Level 1 2 2 2 2    SPM 76 98 100 93 96   Minutes 39 39 39 22 22   METs 1.9 2 2.1 2 2.1   Row Name 05/20/20 1400 06/03/20 1500           Response to Exercise   Blood Pressure (Admit) 138/70 102/58      Blood Pressure (Exercise) 140/68 132/78      Blood Pressure (Exit) 110/70 108/64      Heart Rate (Admit) 89 bpm 83 bpm      Heart Rate (Exercise) 109 bpm 101 bpm      Heart Rate (Exit) 93 bpm 91 bpm      Oxygen Saturation (Admit) 93 % 94 %      Oxygen Saturation (Exercise) 88 % 92 %      Oxygen Saturation (Exit) 96 % 91 %      Rating of Perceived Exertion (Exercise) 12 11      Perceived Dyspnea (Exercise) 12 11      Duration Continue with 30 min of aerobic exercise without signs/symptoms of physical distress. Continue with 30 min of aerobic exercise without signs/symptoms of physical distress.      Intensity THRR unchanged THRR unchanged             Progression   Progression Continue to progress workloads to  maintain intensity without signs/symptoms of physical distress. Continue to progress workloads to maintain intensity without signs/symptoms of physical distress.             Resistance Training   Training Prescription Yes Yes      Weight 4 lbs 4 lbs      Reps 10-15 10-15      Time 10 Minutes 10 Minutes             Oxygen   Oxygen Continuous Continuous      Liters 8 8-10             Treadmill   MPH 1.8 1.8      Grade 0 1      Minutes 17 17      METs 2.2 2.61             NuStep   Level 3 3      SPM 85 90      Minutes 22 22      METs 2.2 2.3             Exercise Comments:  Exercise Comments    Row Name 03/18/20 1443           Exercise Comments Pt completed his first exercise session of pulmonary rehab today. He was able to tolerate exercise well with no complaints. 8L of continuous oxygen was adequate to maintain his oxygen saturations >90%.              Exercise Goals and Review:  Exercise Goals    Row Name 03/14/20 1453 03/18/20 1446 04/08/20 1543 05/06/20 1524 06/03/20 1600  Exercise Goals   Increase Physical Activity Yes Yes Yes Yes Yes   Intervention Provide advice, education, support and counseling about physical activity/exercise needs.;Develop an individualized exercise prescription for aerobic and resistive training based on initial evaluation findings, risk stratification, comorbidities and participant's personal goals. Provide advice, education, support and counseling about physical activity/exercise needs.;Develop an individualized exercise prescription for aerobic and resistive training based on initial evaluation findings, risk stratification, comorbidities and participant's personal goals. Provide advice, education, support and counseling about physical activity/exercise needs.;Develop an individualized exercise prescription for aerobic and resistive training based on initial evaluation findings, risk stratification, comorbidities and participant's  personal goals. Provide advice, education, support and counseling about physical activity/exercise needs.;Develop an individualized exercise prescription for aerobic and resistive training based on initial evaluation findings, risk stratification, comorbidities and participant's personal goals. Provide advice, education, support and counseling about physical activity/exercise needs.;Develop an individualized exercise prescription for aerobic and resistive training based on initial evaluation findings, risk stratification, comorbidities and participant's personal goals.   Expected Outcomes Short Term: Attend rehab on a regular basis to increase amount of physical activity.;Long Term: Add in home exercise to make exercise part of routine and to increase amount of physical activity.;Long Term: Exercising regularly at least 3-5 days a week. Short Term: Attend rehab on a regular basis to increase amount of physical activity.;Long Term: Add in home exercise to make exercise part of routine and to increase amount of physical activity.;Long Term: Exercising regularly at least 3-5 days a week. Short Term: Attend rehab on a regular basis to increase amount of physical activity.;Long Term: Add in home exercise to make exercise part of routine and to increase amount of physical activity.;Long Term: Exercising regularly at least 3-5 days a week. Short Term: Attend rehab on a regular basis to increase amount of physical activity.;Long Term: Add in home exercise to make exercise part of routine and to increase amount of physical activity.;Long Term: Exercising regularly at least 3-5 days a week. Short Term: Attend rehab on a regular basis to increase amount of physical activity.;Long Term: Add in home exercise to make exercise part of routine and to increase amount of physical activity.;Long Term: Exercising regularly at least 3-5 days a week.   Increase Strength and Stamina Yes Yes Yes Yes Yes   Intervention Provide advice,  education, support and counseling about physical activity/exercise needs.;Develop an individualized exercise prescription for aerobic and resistive training based on initial evaluation findings, risk stratification, comorbidities and participant's personal goals. Provide advice, education, support and counseling about physical activity/exercise needs.;Develop an individualized exercise prescription for aerobic and resistive training based on initial evaluation findings, risk stratification, comorbidities and participant's personal goals. Provide advice, education, support and counseling about physical activity/exercise needs.;Develop an individualized exercise prescription for aerobic and resistive training based on initial evaluation findings, risk stratification, comorbidities and participant's personal goals. Provide advice, education, support and counseling about physical activity/exercise needs.;Develop an individualized exercise prescription for aerobic and resistive training based on initial evaluation findings, risk stratification, comorbidities and participant's personal goals. Provide advice, education, support and counseling about physical activity/exercise needs.;Develop an individualized exercise prescription for aerobic and resistive training based on initial evaluation findings, risk stratification, comorbidities and participant's personal goals.   Expected Outcomes Short Term: Increase workloads from initial exercise prescription for resistance, speed, and METs.;Short Term: Perform resistance training exercises routinely during rehab and add in resistance training at home;Long Term: Improve cardiorespiratory fitness, muscular endurance and strength as measured by increased METs and functional capacity ( ) Short  Term: Increase workloads from initial exercise prescription for resistance, speed, and METs.;Short Term: Perform resistance training exercises routinely during rehab and add in resistance  training at home;Long Term: Improve cardiorespiratory fitness, muscular endurance and strength as measured by increased METs and functional capacity (6MWT) Short Term: Increase workloads from initial exercise prescription for resistance, speed, and METs.;Short Term: Perform resistance training exercises routinely during rehab and add in resistance training at home;Long Term: Improve cardiorespiratory fitness, muscular endurance and strength as measured by increased METs and functional capacity (6MWT) Short Term: Increase workloads from initial exercise prescription for resistance, speed, and METs.;Short Term: Perform resistance training exercises routinely during rehab and add in resistance training at home;Long Term: Improve cardiorespiratory fitness, muscular endurance and strength as measured by increased METs and functional capacity (6MWT) Short Term: Increase workloads from initial exercise prescription for resistance, speed, and METs.;Short Term: Perform resistance training exercises routinely during rehab and add in resistance training at home;Long Term: Improve cardiorespiratory fitness, muscular endurance and strength as measured by increased METs and functional capacity (6MWT)   Able to understand and use rate of perceived exertion (RPE) scale Yes Yes Yes Yes Yes   Intervention Provide education and explanation on how to use RPE scale Provide education and explanation on how to use RPE scale Provide education and explanation on how to use RPE scale Provide education and explanation on how to use RPE scale Provide education and explanation on how to use RPE scale   Expected Outcomes Short Term: Able to use RPE daily in rehab to express subjective intensity level;Long Term:  Able to use RPE to guide intensity level when exercising independently Short Term: Able to use RPE daily in rehab to express subjective intensity level;Long Term:  Able to use RPE to guide intensity level when exercising independently  Short Term: Able to use RPE daily in rehab to express subjective intensity level;Long Term:  Able to use RPE to guide intensity level when exercising independently Short Term: Able to use RPE daily in rehab to express subjective intensity level;Long Term:  Able to use RPE to guide intensity level when exercising independently Short Term: Able to use RPE daily in rehab to express subjective intensity level;Long Term:  Able to use RPE to guide intensity level when exercising independently   Able to understand and use Dyspnea scale Yes Yes Yes Yes Yes   Intervention Provide education and explanation on how to use Dyspnea scale Provide education and explanation on how to use Dyspnea scale Provide education and explanation on how to use Dyspnea scale Provide education and explanation on how to use Dyspnea scale Provide education and explanation on how to use Dyspnea scale   Expected Outcomes Short Term: Able to use Dyspnea scale daily in rehab to express subjective sense of shortness of breath during exertion;Long Term: Able to use Dyspnea scale to guide intensity level when exercising independently Short Term: Able to use Dyspnea scale daily in rehab to express subjective sense of shortness of breath during exertion;Long Term: Able to use Dyspnea scale to guide intensity level when exercising independently Short Term: Able to use Dyspnea scale daily in rehab to express subjective sense of shortness of breath during exertion;Long Term: Able to use Dyspnea scale to guide intensity level when exercising independently Short Term: Able to use Dyspnea scale daily in rehab to express subjective sense of shortness of breath during exertion;Long Term: Able to use Dyspnea scale to guide intensity level when exercising independently Short Term: Able to use Dyspnea  scale daily in rehab to express subjective sense of shortness of breath during exertion;Long Term: Able to use Dyspnea scale to guide intensity level when exercising  independently   Knowledge and understanding of Target Heart Rate Range (THRR) Yes Yes Yes Yes Yes   Intervention Provide education and explanation of THRR including how the numbers were predicted and where they are located for reference Provide education and explanation of THRR including how the numbers were predicted and where they are located for reference Provide education and explanation of THRR including how the numbers were predicted and where they are located for reference Provide education and explanation of THRR including how the numbers were predicted and where they are located for reference Provide education and explanation of THRR including how the numbers were predicted and where they are located for reference   Expected Outcomes Short Term: Able to state/look up THRR;Long Term: Able to use THRR to govern intensity when exercising independently;Short Term: Able to use daily as guideline for intensity in rehab Short Term: Able to state/look up THRR;Long Term: Able to use THRR to govern intensity when exercising independently;Short Term: Able to use daily as guideline for intensity in rehab Short Term: Able to state/look up THRR;Short Term: Able to use daily as guideline for intensity in rehab;Long Term: Able to use THRR to govern intensity when exercising independently Short Term: Able to state/look up THRR;Short Term: Able to use daily as guideline for intensity in rehab;Long Term: Able to use THRR to govern intensity when exercising independently Short Term: Able to state/look up THRR;Short Term: Able to use daily as guideline for intensity in rehab;Long Term: Able to use THRR to govern intensity when exercising independently   Understanding of Exercise Prescription Yes Yes Yes Yes Yes   Intervention Provide education, explanation, and written materials on patient's individual exercise prescription Provide education, explanation, and written materials on patient's individual exercise prescription  Provide education, explanation, and written materials on patient's individual exercise prescription Provide education, explanation, and written materials on patient's individual exercise prescription Provide education, explanation, and written materials on patient's individual exercise prescription   Expected Outcomes Short Term: Able to explain program exercise prescription;Long Term: Able to explain home exercise prescription to exercise independently Short Term: Able to explain program exercise prescription;Long Term: Able to explain home exercise prescription to exercise independently Short Term: Able to explain program exercise prescription;Long Term: Able to explain home exercise prescription to exercise independently Short Term: Able to explain program exercise prescription;Long Term: Able to explain home exercise prescription to exercise independently Short Term: Able to explain program exercise prescription;Long Term: Able to explain home exercise prescription to exercise independently          Exercise Goals Re-Evaluation :  Exercise Goals Re-Evaluation    Row Name 03/18/20 1446 04/08/20 1543 05/06/20 1525 06/03/20 1600       Exercise Goal Re-Evaluation   Exercise Goals Review Increase Physical Activity;Increase Strength and Stamina;Able to understand and use rate of perceived exertion (RPE) scale;Able to understand and use Dyspnea scale;Knowledge and understanding of Target Heart Rate Range (THRR);Understanding of Exercise Prescription Increase Physical Activity;Increase Strength and Stamina;Able to understand and use rate of perceived exertion (RPE) scale;Able to understand and use Dyspnea scale;Knowledge and understanding of Target Heart Rate Range (THRR);Understanding of Exercise Prescription Increase Strength and Stamina;Increase Physical Activity;Able to understand and use rate of perceived exertion (RPE) scale;Able to understand and use Dyspnea scale;Knowledge and understanding of  Target Heart Rate Range (THRR);Understanding of Exercise Prescription Increase  Strength and Stamina;Increase Physical Activity;Able to understand and use rate of perceived exertion (RPE) scale;Able to understand and use Dyspnea scale;Knowledge and understanding of Target Heart Rate Range (THRR);Understanding of Exercise Prescription    Comments Pt has completed 1 exercise session. 8 L of O2 was able to maintain his oxygen saturations >90%. He is eager to be in the program. He exercised at 1.9 METs on his first day. WIll continue to monitor and progress as able. Pt has completed 7 exercise sessions. He is progressing well and is going to try the treadmill next week. He feels he is getting stronger. He is exercising at 2.1 METs on the stepper. Will continue to monitor and progress exercise as able. Patient has completed 13 exercise sessions. He is tolerating exercise well and progressing well. He just started using the treadmill and has adapted well to that. He is getting stronger as well in regards to the strength training. He is currently exercising at 2.1 METs on the NuStep. Will continue to monitor and progress exercise as able. Pt has completed 21 exercise sessions. We have recently been increasing his oxygen liter flow to 10 while he is on the treadmill and have been able to maintain his SpO2 levels above 90%. He continues to progress and get stronger. He is currently exercising at 2.3 METs on the stepper. Will continue to monitor and progress as able.    Expected Outcomes Through exercise at rehab and by engaging in a home exercise program, the patient will be able to reach their goals. Through exercising at rehab and at home patient will reach their goals. Through exercise at rehab and a home exercise program, patient will achieve their goals. Through exercise at rehab and a home exercise program, patient will achieve their goals.           Discharge Exercise Prescription (Final Exercise Prescription  Changes):  Exercise Prescription Changes - 06/03/20 1500      Response to Exercise   Blood Pressure (Admit) 102/58    Blood Pressure (Exercise) 132/78    Blood Pressure (Exit) 108/64    Heart Rate (Admit) 83 bpm    Heart Rate (Exercise) 101 bpm    Heart Rate (Exit) 91 bpm    Oxygen Saturation (Admit) 94 %    Oxygen Saturation (Exercise) 92 %    Oxygen Saturation (Exit) 91 %    Rating of Perceived Exertion (Exercise) 11    Perceived Dyspnea (Exercise) 11    Duration Continue with 30 min of aerobic exercise without signs/symptoms of physical distress.    Intensity THRR unchanged      Progression   Progression Continue to progress workloads to maintain intensity without signs/symptoms of physical distress.      Resistance Training   Training Prescription Yes    Weight 4 lbs    Reps 10-15    Time 10 Minutes      Oxygen   Oxygen Continuous    Liters 8-10      Treadmill   MPH 1.8    Grade 1    Minutes 17    METs 2.61      NuStep   Level 3    SPM 90    Minutes 22    METs 2.3           Nutrition:  Target Goals: Understanding of nutrition guidelines, daily intake of sodium 1500mg , cholesterol 200mg , calories 30% from fat and 7% or less from saturated fats, daily to have 5 or more  servings of fruits and vegetables.  Biometrics:  Pre Biometrics - 05/06/20 1528      Pre Biometrics   Weight 86.8 kg            Nutrition Therapy Plan and Nutrition Goals:  Nutrition Therapy & Goals - 05/30/20 0930      Personal Nutrition Goals   Comments We will continue to provide nutritional education through hand-outs.      Intervention Plan   Intervention Nutrition handout(s) given to patient.           Nutrition Assessments:  Nutrition Assessments - 03/14/20 1429      MEDFICTS Scores   Pre Score 36          MEDIFICTS Score Key:  ?70 Need to make dietary changes   40-70 Heart Healthy Diet  ? 40 Therapeutic Level Cholesterol Diet   Picture Your Plate  Scores:  [id number] Unhealthy dietary pattern with much room for improvement.  41-50 Dietary pattern unlikely to meet recommendations for good health and room for improvement.  51-60 More healthful dietary pattern, with some room for improvement.   >60 Healthy dietary pattern, although there may be some specific behaviors that could be improved.    Nutrition Goals Re-Evaluation:   Nutrition Goals Discharge (Final Nutrition Goals Re-Evaluation):   Psychosocial: Target Goals: Acknowledge presence or absence of significant depression and/or stress, maximize coping skills, provide positive support system. Participant is able to verbalize types and ability to use techniques and skills needed for reducing stress and depression.  Initial Review & Psychosocial Screening:  Initial Psych Review & Screening - 03/14/20 1310      Initial Review   Current issues with None Identified      Family Dynamics   Good Support System? Yes    Comments Great support system with wife and his brothers      Barriers   Psychosocial barriers to participate in program There are no identifiable barriers or psychosocial needs.      Screening Interventions   Interventions Encouraged to exercise           Quality of Life Scores:  Quality of Life - 03/14/20 1448      Quality of Life   Select Quality of Life      Quality of Life Scores   Health/Function Pre 13.5 %    Socioeconomic Pre 26.25 %    Psych/Spiritual Pre 14.64 %    Family Pre 26.38 %    GLOBAL Pre 17.18 %          Scores of 19 and below usually indicate a poorer quality of life in these areas.  A difference of  2-3 points is a clinically meaningful difference.  A difference of 2-3 points in the total score of the Quality of Life Index has been associated with significant improvement in overall quality of life, self-image, physical symptoms, and general health in studies assessing change in quality of life.   PHQ-9: Recent Review Flowsheet  Data    Depression screen [facility] 2/9 03/14/2020   Decreased Interest 0   Down, Depressed, Hopeless 0   PHQ - 2 Score 0   Altered sleeping 0   Tired, decreased energy 1   Change in appetite 1   Feeling bad or failure about yourself  0   Trouble concentrating 0   Moving slowly or fidgety/restless 0   Suicidal thoughts 0   PHQ-9 Score 2   Difficult doing work/chores Not difficult at all  Interpretation of Total Score  Total Score Depression Severity:  1-4 = Minimal depression, 5-9 = Mild depression, 10-14 = Moderate depression, 15-19 = Moderately severe depression, 20-27 = Severe depression   Psychosocial Evaluation and Intervention:   Psychosocial Re-Evaluation:  Psychosocial Re-Evaluation    Row Name 03/20/20 0749 04/07/20 1331 05/05/20 0939 05/30/20 0930       Psychosocial Re-Evaluation   Current issues with None Identified - None Identified -    Comments Patient's initial QOL score was 17.18% and his PHQ-9 score was 2 with no psychosocial issues identified. He has a strong support system and a good positive outlook. Will continue to monitor. Patient continues to have no psychosocial issues identified. He has a positive attitude and works hard during his sessions. He continues to use O2 but says he is starting to feel stronger. Will continue to monitor for progress. Patient continues to have no psychosocial issues identified. He continues to demonstrate a positive attitude and outlook and works hard during his sessions. He continues to use O2 but says he is starting to feel stronger. Will continue to monitor for progress. Patient continues to have no psychosocial issues identified. He has completed 20 sessions and continues to demonstrate a positive attitude and outlook and works hard during his sessions. He continues to use O2 but says he is starting to feel stronger. Will continue to monitor for progress.    Expected Outcomes Patient will no have any psychosocial issues identified at  discharge. Patient will no have any psychosocial issues identified at discharge. Patient will no have any psychosocial issues identified at discharge. Patient will no have any psychosocial issues identified at discharge.    Interventions Encouraged to attend Pulmonary Rehabilitation for the exercise;Relaxation education;Stress management education Encouraged to attend Pulmonary Rehabilitation for the exercise;Relaxation education;Stress management education Encouraged to attend Pulmonary Rehabilitation for the exercise;Relaxation education;Stress management education Encouraged to attend Pulmonary Rehabilitation for the exercise;Relaxation education;Stress management education    Continue Psychosocial Services  No Follow up required No Follow up required No Follow up required No Follow up required           Psychosocial Discharge (Final Psychosocial Re-Evaluation):  Psychosocial Re-Evaluation - 05/30/20 0930      Psychosocial Re-Evaluation   Comments Patient continues to have no psychosocial issues identified. He has completed 20 sessions and continues to demonstrate a positive attitude and outlook and works hard during his sessions. He continues to use O2 but says he is starting to feel stronger. Will continue to monitor for progress.    Expected Outcomes Patient will no have any psychosocial issues identified at discharge.    Interventions Encouraged to attend Pulmonary Rehabilitation for the exercise;Relaxation education;Stress management education    Continue Psychosocial Services  No Follow up required            Education: Education Goals: Education classes will be provided on a weekly basis, covering required topics. Participant will state understanding/return demonstration of topics presented.  Learning Barriers/Preferences:  Learning Barriers/Preferences - 03/14/20 1312      Learning Barriers/Preferences   Learning Barriers None    Learning Preferences Skilled Demonstration            Education Topics: How Lungs Work and Diseases: - Discuss the anatomy of the lungs and diseases that can affect the lungs, such as COPD.   Exercise: -Discuss the importance of exercise, FITT principles of exercise, normal and abnormal responses to exercise, and how to exercise safely.   Environmental Irritants: -Discuss  types of environmental irritants and how to limit exposure to environmental irritants.   Meds/Inhalers and oxygen: - Discuss respiratory medications, definition of an inhaler and oxygen, and the proper way to use an inhaler and oxygen.   Energy Saving Techniques: - Discuss methods to conserve energy and decrease shortness of breath when performing activities of daily living.  Flowsheet Row PULMONARY REHAB OTHER RESPIRATORY from 05/15/2020 in [place]  Date 03/20/20  Educator DF  Instruction Review Code 2- Demonstrated Understanding      Bronchial Hygiene / Breathing Techniques: - Discuss breathing mechanics, pursed-lip breathing technique,  proper posture, effective ways to clear airways, and other functional breathing techniques Flowsheet Row PULMONARY REHAB OTHER RESPIRATORY from 05/15/2020 in [place]  Date 03/27/20  Educator DF  Instruction Review Code 2- Demonstrated Understanding      Cleaning Equipment: - Provides group verbal and written instruction about the health risks of elevated stress, cause of high stress, and healthy ways to reduce stress. Flowsheet Row PULMONARY REHAB OTHER RESPIRATORY from 05/15/2020 in [place]  Date 04/03/20  Educator DJ  Instruction Review Code 1- Verbalizes Understanding      Nutrition I: Fats: - Discuss the types of cholesterol, what cholesterol does to the body, and how cholesterol levels can be controlled. Flowsheet Row PULMONARY REHAB OTHER RESPIRATORY from 05/15/2020 in [place]  Date 04/10/20   Educator DJ  Instruction Review Code 1- Verbalizes Understanding      Nutrition II: Labels: -Discuss the different components of food labels and how to read food labels. Flowsheet Row PULMONARY REHAB OTHER RESPIRATORY from 05/15/2020 in [place]  Date 04/15/20  Educator PB  Instruction Review Code 2- Demonstrated Understanding      Respiratory Infections: - Discuss the signs and symptoms of respiratory infections, ways to prevent respiratory infections, and the importance of seeking medical treatment when having a respiratory infection. Flowsheet Row PULMONARY REHAB OTHER RESPIRATORY from 05/15/2020 in [place]  Date 04/24/20  Educator DF  Instruction Review Code 1- Verbalizes Understanding      Stress I: Signs and Symptoms: - Discuss the causes of stress, how stress may lead to anxiety and depression, and ways to limit stress.   Stress II: Relaxation: -Discuss relaxation techniques to limit stress. Flowsheet Row PULMONARY REHAB OTHER RESPIRATORY from 05/15/2020 in [place]  Date 05/08/20  Educator [facility]  Instruction Review Code 2- Demonstrated Understanding      Oxygen for Home/Travel: - Discuss how to prepare for travel when on oxygen and proper ways to transport and store oxygen to ensure safety. Flowsheet Row PULMONARY REHAB OTHER RESPIRATORY from 05/15/2020 in [place]  Date 05/15/20  Educator DJ  Instruction Review Code 1- Verbalizes Understanding      Knowledge Questionnaire Score:  Knowledge Questionnaire Score - 03/14/20 1429      Knowledge Questionnaire Score   Pre Score 5/18           Core Components/Risk Factors/Patient Goals at Admission:  Personal Goals and Risk Factors at Admission - 03/14/20 1314      Core Components/Risk Factors/Patient Goals on Admission    Weight Management Weight Loss;Yes    Admit Weight 193 lb (87.5 kg)    Goal Weight:  Short Term 185 lb (83.9 kg)    Expected Outcomes Short Term: Continue to assess and modify interventions until short term weight is achieved;Long Term: Adherence to nutrition and physical activity/exercise  program aimed toward attainment of established weight goal;Weight Maintenance: Understanding of the daily nutrition guidelines, which includes 25-35% calories from fat, 7% or less cal from saturated fats, less than 200mg  cholesterol, less than 1.5gm of sodium, & 5 or more servings of fruits and vegetables daily;Weight Loss: Understanding of general recommendations for a balanced deficit meal plan, which promotes 1-2 lb weight loss per week and includes a negative energy balance of [phone] kcal/d;Understanding recommendations for meals to include 15-35% energy as protein, 25-35% energy from fat, 35-60% energy from carbohydrates, less than 200mg  of dietary cholesterol, 20-35 gm of total fiber daily;Understanding of distribution of calorie intake throughout the day with the consumption of 4-5 meals/snacks;Weight Gain: Understanding of general recommendations for a high calorie, high protein meal plan that promotes weight gain by distributing calorie intake throughout the day with the consumption for 4-5 meals, snacks, and/or supplements    Improve shortness of breath with ADL's Yes    Intervention Provide education, individualized exercise plan and daily activity instruction to help decrease symptoms of SOB with activities of daily living.    Expected Outcomes Short Term: Improve cardiorespiratory fitness to achieve a reduction of symptoms when performing ADLs;Long Term: Be able to perform more ADLs without symptoms or delay the onset of symptoms           Core Components/Risk Factors/Patient Goals Review:   Goals and Risk Factor Review    Row Name 03/20/20 0748 04/07/20 1333 05/05/20 0940 05/30/20 0931       Core Components/Risk Factors/Patient Goals Review   Personal Goals Review Improve shortness  of breath with ADL's - Improve shortness of breath with ADL's Improve shortness of breath with ADL's    Review Patient is new to the program completing 2 sessions. His personal goals are to be able to walk to his mailbox and to get off of O2.  Will continue to monitor for progress as he works toward meeting his personal and program goals. Patient has completed 6 sessions losing 1 lb since last 30 day review. He is doing well in the program with progression and consistent attendance. His personal goals are to breathe better; to be able to walk to his mailbox and to get off of O2. He feels he is making progress toward meeting his goals. Patient has completed 12 sessions gaining 1 lb since last 30 day review. He was referred to pulmonary rehab with pulmonary HTN. He continues to do well in the program with progression and consistent attendance. He works very hard during sessions and has started using the treadmill for the past 4 sessions tolerating very well. His personal goals are to breathe better; be able to walk to his mailbox; and to get off of O2. He saw his pulmonologist 05/01/20 for a routine f/u visit. He reported to MD that he was feeling a lot stronger and was able to walk to his mail box and able to climb stairs. Will continue to monitor for progress. Patient has completed 20 sessions losing 1 lb since last 30 day review. He continues to do very well in the program with progression and conisistent attendance. He is doing very well on the treadmill. He started taking a new medication for pulmonary HTN, Opsumit, approved 05/06/20. His personal goals are to breathe better; be able to walk to his mailbox; and get off of O2. We will continue to monitor his progress as he works toward meeting these goals.    Expected Outcomes Patient will complete the program  meeting both personal and program goals. Patient will complete the program meeting both personal and program goals. Patient will complete the program  meeting both personal and program goals. Patient will complete the program meeting both personal and program goals.           Core Components/Risk Factors/Patient Goals at Discharge (Final Review):   Goals and Risk Factor Review - 05/30/20 0931      Core Components/Risk Factors/Patient Goals Review   Personal Goals Review Improve shortness of breath with ADL's    Review Patient has completed 20 sessions losing 1 lb since last 30 day review. He continues to do very well in the program with progression and conisistent attendance. He is doing very well on the treadmill. He started taking a new medication for pulmonary HTN, Opsumit, approved 05/06/20. His personal goals are to breathe better; be able to walk to his mailbox; and get off of O2. We will continue to monitor his progress as he works toward meeting these goals.    Expected Outcomes Patient will complete the program meeting both personal and program goals.           ITP Comments:   Comments: ITP REVIEW Pt is making expected progress toward pulmonary rehab goals after completing 22 sessions. Recommend continued exercise, life style modification, education, and utilization of breathing techniques to increase stamina and strength and decrease shortness of breath with exertion.

## 2020-06-10 ENCOUNTER — Encounter (HOSPITAL_COMMUNITY): Payer: Medicare Other

## 2020-06-10 ENCOUNTER — Telehealth: Payer: Self-pay | Admitting: *Deleted

## 2020-06-10 NOTE — Telephone Encounter (Signed)
Staff message sent to [employer]. Ok to schedule CPAP titration. Patient has medicare. No PA is required.

## 2020-06-10 NOTE — Telephone Encounter (Signed)
-----   Message from Freada Bergeron, Terry sent at 06/04/2020  4:12 PM EST ----- Regarding: precert  recommend CPAP titration.

## 2020-06-11 NOTE — Addendum Note (Signed)
Addended by: Freada Bergeron on: 06/11/2020 04:22 PM   Modules accepted: Orders

## 2020-06-12 ENCOUNTER — Other Ambulatory Visit: Payer: Self-pay

## 2020-06-12 ENCOUNTER — Encounter (HOSPITAL_COMMUNITY)
Admission: RE | Admit: 2020-06-12 | Discharge: 2020-06-12 | Disposition: A | Discharge status: Home / Self Care | Payer: Medicare Other | Source: Ambulatory Visit | Attending: Internal Medicine | Admitting: Internal Medicine

## 2020-06-12 DIAGNOSIS — I272 Pulmonary hypertension, unspecified: Secondary | ICD-10-CM

## 2020-06-12 NOTE — Progress Notes (Signed)
Daily Session Note  Patient Details  Name: CLABORN JANUSZ MRN: [id number] Date of Birth: Dec 08, 1950 Referring Provider:   Flowsheet Row PULMONARY REHAB OTHER RESP ORIENTATION from 03/14/2020 in [place]  Referring Provider Dr. Haroldine Laws      Encounter Date: 06/12/2020  Check In:  Session Check In - 06/12/20 1330      Check-In   Supervising physician immediately available to respond to emergencies [facility] MD immediately available    Physician(s) Dr. Domenic Polite    Location [facility]    Staff Present Geanie Cooley, RN;Chris Narasimhan Wynetta Emery, RN, BSN;Madison Audria Nine, MS, Exercise Physiologist    Virtual Visit No    Medication changes reported     No    Fall or balance concerns reported    No    Tobacco Cessation No Change    Warm-up and Cool-down Performed as group-led instruction    Resistance Training Performed Yes    VAD Patient? No    PAD/SET Patient? No      Pain Assessment   Currently in Pain? No/denies    Pain Score 0-No pain    Multiple Pain Sites No           Capillary Blood Glucose: No results found for this or any previous visit (from the past 24 hour(s)).    Social History   Tobacco Use  Smoking Status Former Smoker  . Quit date: 05/24/2002  . Years since quitting: 18.0  Smokeless Tobacco Never Used    Goals Met:  Proper associated with RPD/PD & O2 Sat Independence with exercise equipment Improved SOB with ADL's Using PLB without cueing & demonstrates good technique Exercise tolerated well No report of cardiac concerns or symptoms Strength training completed today  Goals Unmet:  Not Applicable  Comments: Check out 1430.   Dr. Kathie Dike is Medical Director for [facility] Pulmonary Rehab.

## 2020-06-12 NOTE — Telephone Encounter (Signed)
Patient is scheduled for CPAP Titration on 07/09/20. Patient understands his titration study will be done at [facility] sleep lab. Patient understands he will receive a letter in a week or so detailing appointment, date, time, and location. Patient understands to call if he does not receive the letter  in a timely manner. Patient agrees with treatment and thanked me for call. 

## 2020-06-17 ENCOUNTER — Encounter (HOSPITAL_COMMUNITY)
Admission: RE | Admit: 2020-06-17 | Discharge: 2020-06-17 | Disposition: A | Discharge status: Home / Self Care | Payer: Medicare Other | Source: Ambulatory Visit | Attending: Internal Medicine | Admitting: Internal Medicine

## 2020-06-17 ENCOUNTER — Other Ambulatory Visit: Payer: Self-pay

## 2020-06-17 VITALS — Wt 188.9 lb

## 2020-06-17 DIAGNOSIS — I272 Pulmonary hypertension, unspecified: Secondary | ICD-10-CM | POA: Diagnosis not present

## 2020-06-17 NOTE — Progress Notes (Signed)
Daily Session Note  Patient Details  Name: Warren [name] MRN: [id number] Date of Birth: Sep 26, 1950 Referring Provider:   Flowsheet Row PULMONARY REHAB OTHER RESP ORIENTATION from 03/14/2020 in [place]  Referring Provider Dr. Haroldine Laws      Encounter Date: 06/17/2020  Check In:  Session Check In - 06/17/20 1330      Check-In   Supervising physician immediately available to respond to emergencies [facility] MD immediately available    Physician(s) Branch    Location [facility]    Staff Present Geanie Cooley, RN;Madison Audria Nine, MS, Exercise Physiologist    Virtual Visit No    Medication changes reported     No    Tobacco Cessation No Change    Warm-up and Cool-down Performed as group-led instruction    Resistance Training Performed Yes    VAD Patient? No      Pain Assessment   Currently in Pain? No/denies    Pain Score 0-No pain    Multiple Pain Sites No           Capillary Blood Glucose: No results found for this or any previous visit (from the past 24 hour(s)).    Social History   Tobacco Use  Smoking Status Former Smoker  . Quit date: 05/24/2002  . Years since quitting: 18.0  Smokeless Tobacco Never Used    Goals Met:  Independence with exercise equipment Using PLB without cueing & demonstrates good technique Exercise tolerated well No report of cardiac concerns or symptoms Strength training completed today  Goals Unmet:  Not Applicable  Comments: check out @ 2:30   Dr. Kathie Dike is Medical Director for [facility].

## 2020-06-19 ENCOUNTER — Encounter (HOSPITAL_COMMUNITY): Payer: Medicare Other

## 2020-06-20 NOTE — Telephone Encounter (Signed)
Patient is scheduled for CPAP Titration on 07/09/20. Patient understands his titration study will be done at [facility] sleep lab. Patient understands he will receive a letter in a week or so detailing appointment, date, time, and location. Patient understands to call if he does not receive the letter  in a timely manner. Patient agrees with treatment and thanked me for call.

## 2020-06-24 ENCOUNTER — Encounter (HOSPITAL_COMMUNITY)
Admission: RE | Admit: 2020-06-24 | Discharge: 2020-06-24 | Disposition: A | Discharge status: Home / Self Care | Payer: Medicare Other | Source: Ambulatory Visit | Attending: Internal Medicine | Admitting: Internal Medicine

## 2020-06-24 ENCOUNTER — Other Ambulatory Visit: Payer: Self-pay

## 2020-06-24 DIAGNOSIS — I272 Pulmonary hypertension, unspecified: Secondary | ICD-10-CM | POA: Diagnosis not present

## 2020-06-24 NOTE — Progress Notes (Signed)
Daily Session Note  Patient Details  Name: Warren [name] MRN: [id number] Date of Birth: March 05, 1951 Referring Provider:   Flowsheet Row PULMONARY REHAB OTHER RESP ORIENTATION from 03/14/2020 in [place]  Referring Provider Dr. Haroldine Laws      Encounter Date: 06/24/2020  Check In:  Session Check In - 06/24/20 1330      Check-In   Supervising physician immediately available to respond to emergencies [facility] MD immediately available    Physician(s) Dr. Domenic Polite    Location [facility]    Staff Present Cathren Harsh, MS, Exercise Physiologist;Phyllis Billingsley, RN    Virtual Visit No    Medication changes reported     No    Fall or balance concerns reported    No    Tobacco Cessation No Change    Warm-up and Cool-down Performed as group-led instruction    Resistance Training Performed Yes    VAD Patient? No    PAD/SET Patient? No      Pain Assessment   Currently in Pain? No/denies           Capillary Blood Glucose: No results found for this or any previous visit (from the past 24 hour(s)).    Social History   Tobacco Use  Smoking Status Former Smoker  . Quit date: 05/24/2002  . Years since quitting: 18.0  Smokeless Tobacco Never Used    Goals Met:  Independence with exercise equipment Exercise tolerated well No report of cardiac concerns or symptoms Strength training completed today  Goals Unmet:  Not Applicable  Comments: check out 1430   Dr. Kathie Dike is Medical Director for [facility] Pulmonary Rehab.

## 2020-06-26 ENCOUNTER — Encounter (HOSPITAL_COMMUNITY)
Admission: RE | Admit: 2020-06-26 | Discharge: 2020-06-26 | Disposition: A | Discharge status: Home / Self Care | Payer: Medicare Other | Source: Ambulatory Visit | Attending: Internal Medicine | Admitting: Internal Medicine

## 2020-06-26 ENCOUNTER — Other Ambulatory Visit: Payer: Self-pay

## 2020-06-26 DIAGNOSIS — I272 Pulmonary hypertension, unspecified: Secondary | ICD-10-CM

## 2020-06-26 NOTE — Progress Notes (Signed)
Daily Session Note  Patient Details  Name: Warren [name] MRN: [id number] Date of Birth: 05/13/1951 Referring Provider:   Flowsheet Row PULMONARY REHAB OTHER RESP ORIENTATION from 03/14/2020 in [place]  Referring Provider Dr. Haroldine Laws      Encounter Date: 06/26/2020  Check In:  Session Check In - 06/26/20 1330      Check-In   Supervising physician immediately available to respond to emergencies [facility] MD immediately available    Physician(s) Dr. Domenic Polite    Location [facility]    Staff Present Geanie Cooley, RN;Debra Wynetta Emery, RN, BSN;Madison Audria Nine, MS, Exercise Physiologist;Dalton Kris Mouton, MS, ACSM-CEP, Exercise Physiologist    Virtual Visit No    Medication changes reported     No    Fall or balance concerns reported    No    Tobacco Cessation No Change    Warm-up and Cool-down Performed as group-led instruction    Resistance Training Performed Yes    VAD Patient? No    PAD/SET Patient? No      Pain Assessment   Currently in Pain? No/denies    Pain Score 0-No pain    Multiple Pain Sites No           Capillary Blood Glucose: No results found for this or any previous visit (from the past 24 hour(s)).    Social History   Tobacco Use  Smoking Status Former Smoker  . Quit date: 05/24/2002  . Years since quitting: 18.1  Smokeless Tobacco Never Used    Goals Met:  Proper associated with RPD/PD & O2 Sat Independence with exercise equipment Improved SOB with ADL's Exercise tolerated well No report of cardiac concerns or symptoms Strength training completed today  Goals Unmet:  Not Applicable  Comments: check out @ 2:30   Dr. Kathie Dike is Medical Director for [facility] Pulmonary Rehab.

## 2020-06-30 ENCOUNTER — Other Ambulatory Visit (HOSPITAL_COMMUNITY): Payer: Self-pay | Admitting: Internal Medicine

## 2020-07-01 ENCOUNTER — Other Ambulatory Visit: Payer: Self-pay

## 2020-07-01 ENCOUNTER — Encounter (HOSPITAL_COMMUNITY)
Admission: RE | Admit: 2020-07-01 | Discharge: 2020-07-01 | Disposition: A | Discharge status: Home / Self Care | Payer: Medicare Other | Source: Ambulatory Visit | Attending: Internal Medicine | Admitting: Internal Medicine

## 2020-07-01 VITALS — Wt 190.3 lb

## 2020-07-01 DIAGNOSIS — I272 Pulmonary hypertension, unspecified: Secondary | ICD-10-CM

## 2020-07-01 NOTE — Progress Notes (Signed)
Daily Session Note  Patient Details  Name: SHANNAN GARFINKEL MRN: [id number] Date of Birth: [birth year] Referring Provider:   Flowsheet Row PULMONARY REHAB OTHER RESP ORIENTATION from 03/14/2020 in [place]  Referring Provider Dr. Haroldine Laws      Encounter Date: 07/01/2020  Check In:  Session Check In - 07/01/20 1330      Check-In   Supervising physician immediately available to respond to emergencies [facility] immediately available    Physician(s) Dr. Harl Bowie    Location [facility]    Staff Present Geanie Cooley, RN;Madison Audria Nine, MS, Exercise Physiologist;Dalton Kris Mouton, MS, ACSM-CEP, Exercise Physiologist    Virtual Visit No    Medication changes reported     No    Fall or balance concerns reported    No    Tobacco Cessation No Change    Warm-up and Cool-down Performed as group-led instruction    Resistance Training Performed Yes    VAD Patient? No    PAD/SET Patient? No      Pain Assessment   Currently in Pain? No/denies    Pain Score 0-No pain    Multiple Pain Sites No           Capillary Blood Glucose: No results found for this or any previous visit (from the past 24 hour(s)).    Social History   Tobacco Use  Smoking Status Former Smoker  . Quit date: 05/24/2002  . Years since quitting: 18.1  Smokeless Tobacco Never Used    Goals Met:  Proper associated with RPD/PD & O2 Sat Independence with exercise equipment Improved SOB with ADL's Exercise tolerated well No report of cardiac concerns or symptoms Strength training completed today  Goals Unmet:  Not Applicable  Comments: check out @ 2:30 pm   Dr. Kathie Dike is Medical Director for [facility] Pulmonary Rehab.

## 2020-07-03 ENCOUNTER — Encounter (HOSPITAL_COMMUNITY)
Admission: RE | Admit: 2020-07-03 | Discharge: 2020-07-03 | Disposition: A | Discharge status: Home / Self Care | Payer: Medicare Other | Source: Ambulatory Visit | Attending: Internal Medicine | Admitting: Internal Medicine

## 2020-07-03 ENCOUNTER — Other Ambulatory Visit: Payer: Self-pay

## 2020-07-03 DIAGNOSIS — I272 Pulmonary hypertension, unspecified: Secondary | ICD-10-CM

## 2020-07-03 NOTE — Progress Notes (Signed)
Daily Session Note  Patient Details  Name: Warren [name] MRN: [id number] Date of Birth: 1950-11-27 Referring Provider:   Flowsheet Row PULMONARY REHAB OTHER RESP ORIENTATION from 03/14/2020 in [place]  Referring Provider Dr. Haroldine Laws      Encounter Date: 07/03/2020  Check In:  Session Check In - 07/03/20 1330      Check-In   Supervising physician immediately available to respond to emergencies [facility] MD immediately available    Physician(s) Dr. Harl Bowie    Location [facility]    Staff Present Aundra Dubin, RN, BSN;Phyllis Billingsley, RN;Madison Audria Nine, MS, Exercise Physiologist    Virtual Visit No    Medication changes reported     No    Fall or balance concerns reported    No    Tobacco Cessation No Change    Warm-up and Cool-down Performed as group-led instruction    Resistance Training Performed Yes    VAD Patient? No    PAD/SET Patient? No      Pain Assessment   Currently in Pain? No/denies    Pain Score 0-No pain    Multiple Pain Sites No           Capillary Blood Glucose: No results found for this or any previous visit (from the past 24 hour(s)).    Social History   Tobacco Use  Smoking Status Former Smoker  . Quit date: 05/24/2002  . Years since quitting: 18.1  Smokeless Tobacco Never Used    Goals Met:  Proper associated with RPD/PD & O2 Sat Independence with exercise equipment Improved SOB with ADL's Using PLB without cueing & demonstrates good technique Exercise tolerated well No report of cardiac concerns or symptoms Strength training completed today  Goals Unmet:  Not Applicable  Comments: Check out 1430.   Dr. Kathie Dike is Medical Director for [facility] Pulmonary Rehab.

## 2020-07-03 NOTE — Progress Notes (Signed)
Pulmonary Individual Treatment Plan  Patient Details  Name: Warren [name] MRN: [id number] Date of Birth: 02-07-1951 Referring Provider:   [place] from 03/14/2020 in [place]  Referring Provider Dr. Haroldine Laws      Initial Encounter Date:  Flowsheet Row PULMONARY REHAB OTHER RESP ORIENTATION from 03/14/2020 in [place] [facility]  Date 03/14/20      Visit Diagnosis: Pulmonary hypertension, unspecified ([place])  Patient's Home Medications on Admission:   Current Outpatient Medications:  .  acetaminophen (TYLENOL) 500 MG tablet, Take 1,000 mg by mouth every 6 (six) hours as needed for moderate pain or headache., Disp: , Rfl:  .  aspirin EC 81 MG tablet, Take 1 tablet (81 mg total) by mouth daily., Disp: 90 tablet, Rfl: 3 .  ezetimibe (ZETIA) 10 MG tablet, Take 10 mg by mouth daily., Disp: , Rfl:  .  hydrochlorothiazide (HYDRODIURIL) 25 MG tablet, Take 25 mg by mouth daily. , Disp: , Rfl:  .  macitentan (OPSUMIT) 10 MG tablet, Take 1 tablet (10 mg total) by mouth daily., Disp: 30 tablet, Rfl: 11 .  Menthol-Methyl Salicylate (MUSCLE RUB) 10-15 % CREA, Apply 1 application topically as needed for muscle pain., Disp: , Rfl:  .  OXYGEN, Inhale 2-3 L into the lungs continuous. 2 L at rest and with activity 3 L, Disp: , Rfl:  .  potassium chloride (KLOR-CON) 10 MEQ tablet, Take 2 tablets (20 mEq total) by mouth daily., Disp: 90 tablet, Rfl: 2 .  rosuvastatin (CRESTOR) 5 MG tablet, Take 1 tablet (5 mg total) by mouth daily., Disp: 30 tablet, Rfl: 3 .  sildenafil (REVATIO) 20 MG tablet, Take 2 tablets (40 mg total) by mouth 3 (three) times daily., Disp: 180 tablet, Rfl: 11 No current facility-administered medications for this encounter.  Facility-Administered Medications Ordered in Other Encounters:  .  sodium chloride flush (NS) 0.9 % injection 10 mL, 10 mL, Intravenous, PRN, [name], Cassie Freer, MD, 20 mL at  12/05/19 1125  Past Medical History: Past Medical History:  Diagnosis Date  . CHF (congestive heart failure) ([place])   . Coronary artery disease   . Hyperlipidemia   . Hypertension     Tobacco Use: Social History   Tobacco Use  Smoking Status Former Smoker  . Quit date: 05/24/2002  . Years since quitting: 18.1  Smokeless Tobacco Never Used    Labs: Recent Review Flowsheet Data    Labs for ITP Cardiac and Pulmonary Rehab Latest Ref Rng & Units 11/15/2008 11/16/2008 12/14/2019 12/14/2019 12/14/2019   Cholestrol 0 - 200 mg/dL - 133 - - -   LDLCALC 0 - 99 mg/dL - 68 - - -   HDL >39 mg/dL - 41 - - -   Trlycerides <150 mg/dL - 120 - - -   Hemoglobin A1c % 5.5 - - - -   PHART 7.350 - 7.450 - - 7.421 - -   PCO2ART 32.0 - 48.0 mmHg - - 37.7 - -   HCO3 20.0 - 28.0 mmol/L - - 24.5 20.8 27.0   TCO2 22 - 32 mmol/L - - 26 22 28    ACIDBASEDEF 0.0 - 2.0 mmol/L - - - 4.0(H) -   O2SAT % - - 92.0 69.0 71.0      Capillary Blood Glucose: No results found for: GLUCAP   Pulmonary Assessment Scores:  Pulmonary Assessment Scores    Row Name 03/14/20 [place]  SOB Score total 24           CAT Score   CAT Score 17           mMRC Score   mMRC Score 4           UCSD: Self-administered rating of dyspnea associated with activities of daily living (ADLs) 6-point scale (0 = "not at all" to 5 = "maximal or unable to do because of breathlessness")  Scoring Scores range from 0 to 120.  Minimally important difference is 5 units  CAT: CAT can identify the health impairment of COPD patients and is better correlated with disease progression.  CAT has a scoring range of zero to 40. The CAT score is classified into four groups of low (less than 10), medium (10 - 20), high (21-30) and very high (31-40) based on the impact level of disease on health status. A CAT score over 10 suggests significant symptoms.  A worsening CAT score could be explained by an exacerbation, poor medication  adherence, poor inhaler technique, or progression of COPD or comorbid conditions.  CAT MCID is 2 points  mMRC: mMRC (Modified Medical Research Council) Dyspnea Scale is used to assess the degree of baseline functional disability in patients of respiratory disease due to dyspnea. No minimal important difference is established. A decrease in score of 1 point or greater is considered a positive change.   Pulmonary Function Assessment:  Pulmonary Function Assessment - 03/14/20 1309      Breath   Shortness of Breath Yes;Limiting activity           Exercise Target Goals: Exercise Program Goal: Individual exercise prescription set using results from initial 6 min walk test and THRR while considering  patient's activity barriers and safety.   Exercise Prescription Goal: Initial exercise prescription builds to 30-45 minutes a day of aerobic activity, 2-3 days per week.  Home exercise guidelines will be given to patient during program as part of exercise prescription that the participant will acknowledge.  Activity Barriers & Risk Stratification:  Activity Barriers & Cardiac Risk Stratification - 03/14/20 1302      Activity Barriers & Cardiac Risk Stratification   Activity Barriers Arthritis;Back Problems;Neck/Spine Problems;Joint Problems;Deconditioning;Muscular Weakness;Shortness of Breath    Cardiac Risk Stratification Moderate           6 Minute Walk:  6 Minute Walk    Row Name 03/14/20 1449         6 Minute Walk   Phase Initial     Distance 950 feet     Walk Time 6 minutes     # of Rest Breaks 1     MPH 1.8     METS 2.61     RPE 13     Perceived Dyspnea  15     VO2 Peak 9.13     Symptoms Yes (comment)     Comments Pt stopped for 1 min 15 seconds to allow for SpO2 to increase to >80%     Resting HR 70 bpm     Resting BP 96/64     Resting Oxygen Saturation  96 %     Exercise Oxygen Saturation  during 6 min walk 78 %     Max Ex. HR 115 bpm     Max Ex. BP 144/82      2 Minute Post BP 110/56           Interval HR   1 Minute HR 84  2 Minute HR 114     3 Minute HR 95     4 Minute HR 108     5 Minute HR 115     6 Minute HR 115     2 Minute Post HR 88     Interval Heart Rate? Yes           Interval Oxygen   Interval Oxygen? Yes     Baseline Oxygen Saturation % 96 %     1 Minute Oxygen Saturation % 87 %     1 Minute Liters of Oxygen 4 L     2 Minute Oxygen Saturation % 78 %     2 Minute Liters of Oxygen 6 L     3 Minute Oxygen Saturation % 82 %     3 Minute Liters of Oxygen 8 L     4 Minute Oxygen Saturation % 85 %     4 Minute Liters of Oxygen 8 L     5 Minute Oxygen Saturation % 81 %     5 Minute Liters of Oxygen 8 L     6 Minute Oxygen Saturation % 78 %     6 Minute Liters of Oxygen 8 L     2 Minute Post Oxygen Saturation % 89 %     2 Minute Post Liters of Oxygen 8 L            Oxygen Initial Assessment:  Oxygen Initial Assessment - 03/14/20 1428      Initial 6 min Walk   Oxygen Used Continuous    Liters per minute 8      Program Oxygen Prescription   Program Oxygen Prescription Continuous    Liters per minute 8      Intervention   Short Term Goals To learn and exhibit compliance with exercise, home and travel O2 prescription;To learn and understand importance of monitoring SPO2 with pulse oximeter and demonstrate accurate use of the pulse oximeter.;To learn and demonstrate proper use of respiratory medications;To learn and demonstrate proper pursed lip breathing techniques or other breathing techniques.;To learn and understand importance of maintaining oxygen saturations>88%    Long  Term Goals Exhibits compliance with exercise, home and travel O2 prescription;Verbalizes importance of monitoring SPO2 with pulse oximeter and return demonstration;Maintenance of O2 saturations>88%;Exhibits proper breathing techniques, such as pursed lip breathing or other method taught during program session;Compliance with respiratory  medication;Demonstrates proper use of MDI's           Oxygen Re-Evaluation:  Oxygen Re-Evaluation    Row Name 03/18/20 1443 04/08/20 1542 05/06/20 1528 06/03/20 1557 07/01/20 1617     Program Oxygen Prescription   Program Oxygen Prescription Continuous Continuous Continuous Continuous Continuous   Liters per minute 8 8 8 8 8    Comments - - - sometimes 10 on treadmill -     Home Oxygen   Home Oxygen Device E-Tanks;Home Concentrator - E-Tanks;Home Concentrator E-Tanks;Home Concentrator E-Tanks;Home Concentrator   Sleep Oxygen Prescription Continuous - Continuous Continuous Continuous   Liters per minute 3 - - 3 3   Home Exercise Oxygen Prescription Continuous Continuous Continuous Continuous Continuous   Liters per minute 8 8 - 8 8   Home Resting Oxygen Prescription Continuous Continuous Continuous Continuous Continuous   Liters per minute 3 3 - 3 3   Compliance with Home Oxygen Use Yes Yes Yes Yes Yes     Goals/Expected Outcomes   Short Term Goals To learn and exhibit compliance with exercise,  home and travel O2 prescription;To learn and understand importance of monitoring SPO2 with pulse oximeter and demonstrate accurate use of the pulse oximeter.;To learn and demonstrate proper use of respiratory medications;To learn and demonstrate proper pursed lip breathing techniques or other breathing techniques.;To learn and understand importance of maintaining oxygen saturations>88% To learn and exhibit compliance with exercise, home and travel O2 prescription;To learn and understand importance of monitoring SPO2 with pulse oximeter and demonstrate accurate use of the pulse oximeter.;To learn and understand importance of maintaining oxygen saturations>88%;To learn and demonstrate proper pursed lip breathing techniques or other breathing techniques.;To learn and demonstrate proper use of respiratory medications To learn and exhibit compliance with exercise, home and travel O2 prescription;To learn  and understand importance of monitoring SPO2 with pulse oximeter and demonstrate accurate use of the pulse oximeter.;To learn and understand importance of maintaining oxygen saturations>88%;To learn and demonstrate proper pursed lip breathing techniques or other breathing techniques.;To learn and demonstrate proper use of respiratory medications To learn and exhibit compliance with exercise, home and travel O2 prescription;To learn and understand importance of monitoring SPO2 with pulse oximeter and demonstrate accurate use of the pulse oximeter.;To learn and understand importance of maintaining oxygen saturations>88%;To learn and demonstrate proper pursed lip breathing techniques or other breathing techniques.;To learn and demonstrate proper use of respiratory medications To learn and exhibit compliance with exercise, home and travel O2 prescription;To learn and understand importance of monitoring SPO2 with pulse oximeter and demonstrate accurate use of the pulse oximeter.;To learn and understand importance of maintaining oxygen saturations>88%;To learn and demonstrate proper pursed lip breathing techniques or other breathing techniques.;To learn and demonstrate proper use of respiratory medications   Long  Term Goals Exhibits compliance with exercise, home and travel O2 prescription;Verbalizes importance of monitoring SPO2 with pulse oximeter and return demonstration;Maintenance of O2 saturations>88%;Exhibits proper breathing techniques, such as pursed lip breathing or other method taught during program session;Compliance with respiratory medication;Demonstrates proper use of MDI's Demonstrates proper use of MDI's;Compliance with respiratory medication;Exhibits proper breathing techniques, such as pursed lip breathing or other method taught during program session;Maintenance of O2 saturations>88%;Verbalizes importance of monitoring SPO2 with pulse oximeter and return demonstration;Exhibits compliance with  exercise, home and travel O2 prescription Exhibits compliance with exercise, home and travel O2 prescription;Verbalizes importance of monitoring SPO2 with pulse oximeter and return demonstration;Maintenance of O2 saturations>88%;Compliance with respiratory medication;Demonstrates proper use of MDI's;Exhibits proper breathing techniques, such as pursed lip breathing or other method taught during program session Exhibits compliance with exercise, home and travel O2 prescription;Verbalizes importance of monitoring SPO2 with pulse oximeter and return demonstration;Maintenance of O2 saturations>88%;Compliance with respiratory medication;Demonstrates proper use of MDI's;Exhibits proper breathing techniques, such as pursed lip breathing or other method taught during program session Exhibits compliance with exercise, home and travel O2 prescription;Verbalizes importance of monitoring SPO2 with pulse oximeter and return demonstration;Maintenance of O2 saturations>88%;Compliance with respiratory medication;Demonstrates proper use of MDI's;Exhibits proper breathing techniques, such as pursed lip breathing or other method taught during program session   Goals/Expected Outcomes compliance Compliance Compliance Compliance Compliance          Oxygen Discharge (Final Oxygen Re-Evaluation):  Oxygen Re-Evaluation - 07/01/20 1617      Program Oxygen Prescription   Program Oxygen Prescription Continuous    Liters per minute 8      Home Oxygen   Home Oxygen Device E-Tanks;Home Concentrator    Sleep Oxygen Prescription Continuous    Liters per minute 3    Home Exercise Oxygen Prescription Continuous  Liters per minute 8    Home Resting Oxygen Prescription Continuous    Liters per minute 3    Compliance with Home Oxygen Use Yes      Goals/Expected Outcomes   Short Term Goals To learn and exhibit compliance with exercise, home and travel O2 prescription;To learn and understand importance of monitoring SPO2  with pulse oximeter and demonstrate accurate use of the pulse oximeter.;To learn and understand importance of maintaining oxygen saturations>88%;To learn and demonstrate proper pursed lip breathing techniques or other breathing techniques.;To learn and demonstrate proper use of respiratory medications    Long  Term Goals Exhibits compliance with exercise, home and travel O2 prescription;Verbalizes importance of monitoring SPO2 with pulse oximeter and return demonstration;Maintenance of O2 saturations>88%;Compliance with respiratory medication;Demonstrates proper use of MDI's;Exhibits proper breathing techniques, such as pursed lip breathing or other method taught during program session    Goals/Expected Outcomes Compliance           Initial Exercise Prescription:  Initial Exercise Prescription - 03/14/20 1400      Date of Initial Exercise RX and Referring Provider   Date 03/14/20    Referring Provider Dr. Haroldine Laws    Expected Discharge Date 07/18/20      Oxygen   Oxygen Continuous    Liters 8      NuStep   Level 1    SPM 80    Minutes 39      Prescription Details   Frequency (times per week) 2    Duration Progress to 30 minutes of continuous aerobic without signs/symptoms of physical distress      Intensity   THRR 40-80% of Max Heartrate 61-122    Ratings of Perceived Exertion 11-13    Perceived Dyspnea 0-4      Resistance Training   Training Prescription Yes    Weight 2 lbs    Reps 10-15           Perform Capillary Blood Glucose checks as needed.  Exercise Prescription Changes:   Exercise Prescription Changes    Row Name 03/18/20 1400 04/01/20 1500 04/08/20 1400 04/22/20 1500 05/06/20 1500     Response to Exercise   Blood Pressure (Admit) 112/70 108/84 112/84 114/82 110/62   Blood Pressure (Exercise) 130/82 128/76 152/70 136/82 120/68   Blood Pressure (Exit) 102/62 102/70 112/70 122/84 118/76   Heart Rate (Admit) 82 bpm 95 bpm 92 bpm 89 bpm 90 bpm   Heart  Rate (Exercise) 92 bpm 119 bpm 106 bpm 103 bpm 104 bpm   Heart Rate (Exit) 82 bpm 108 bpm 89 bpm 80 bpm 87 bpm   Oxygen Saturation (Admit) 92 % 94 % 92 % 97 % 94 %   Oxygen Saturation (Exercise) 91 % 86 % 89 % 91 % 88 %   Oxygen Saturation (Exit) 96 % 97 % 98 % 96 % 97 %   Rating of Perceived Exertion (Exercise) 9 12 12 11 10    Perceived Dyspnea (Exercise) 11 12 12 11 11    Duration Continue with 30 min of aerobic exercise without signs/symptoms of physical distress. Continue with 30 min of aerobic exercise without signs/symptoms of physical distress. Continue with 30 min of aerobic exercise without signs/symptoms of physical distress. Continue with 30 min of aerobic exercise without signs/symptoms of physical distress. Continue with 30 min of aerobic exercise without signs/symptoms of physical distress.   Intensity THRR unchanged THRR unchanged THRR unchanged THRR unchanged THRR unchanged     Progression   Progression Continue to  progress workloads to maintain intensity without signs/symptoms of physical distress. Continue to progress workloads to maintain intensity without signs/symptoms of physical distress. Continue to progress workloads to maintain intensity without signs/symptoms of physical distress. Continue to progress workloads to maintain intensity without signs/symptoms of physical distress. Continue to progress workloads to maintain intensity without signs/symptoms of physical distress.     Resistance Training   Training Prescription Yes Yes Yes Yes Yes   Weight 2 lbs 3 lbs 3 lbs 3 lbs 3 lbs   Reps 10-15 10-15 10-15 10-15 10-15   Time 10 Minutes 10 Minutes 10 Minutes 10 Minutes 10 Minutes     Oxygen   Oxygen Continuous Continuous Continuous Continuous Continuous   Liters 8 8 8 8 8      Treadmill   MPH - - - 1.4 1.6   Grade - - - 0 0   Minutes - - - 17 17   METs - - - 2.1 2.2     NuStep   Level 1 2 2 2 2    SPM 76 98 100 93 96   Minutes 39 39 39 22 22   METs 1.9 2 2.1 2 2.1    Row Name 05/20/20 1400 06/03/20 1500 06/17/20 1600 07/01/20 1600       Response to Exercise   Blood Pressure (Admit) 138/70 102/58 102/64 112/70    Blood Pressure (Exercise) 140/68 132/78 118/68 136/76    Blood Pressure (Exit) 110/70 108/64 100/64 106/70    Heart Rate (Admit) 89 bpm 83 bpm 79 bpm 90 bpm    Heart Rate (Exercise) 109 bpm 101 bpm 103 bpm 108 bpm    Heart Rate (Exit) 93 bpm 91 bpm 88 bpm 82 bpm    Oxygen Saturation (Admit) 93 % 94 % 93 % 95 %    Oxygen Saturation (Exercise) 88 % 92 % 91 % 88 %    Oxygen Saturation (Exit) 96 % 91 % 98 % 96 %    Rating of Perceived Exertion (Exercise) 12 11 11 10     Perceived Dyspnea (Exercise) 12 11 11 10     Duration Continue with 30 min of aerobic exercise without signs/symptoms of physical distress. Continue with 30 min of aerobic exercise without signs/symptoms of physical distress. Continue with 30 min of aerobic exercise without signs/symptoms of physical distress. Continue with 30 min of aerobic exercise without signs/symptoms of physical distress.    Intensity THRR unchanged THRR unchanged THRR unchanged THRR unchanged         Progression   Progression Continue to progress workloads to maintain intensity without signs/symptoms of physical distress. Continue to progress workloads to maintain intensity without signs/symptoms of physical distress. Continue to progress workloads to maintain intensity without signs/symptoms of physical distress. Continue to progress workloads to maintain intensity without signs/symptoms of physical distress.         Resistance Training   Training Prescription Yes Yes Yes Yes    Weight 4 lbs 4 lbs 4 lbs 5 lbs    Reps 10-15 10-15 10-15 10-15    Time 10 Minutes 10 Minutes 10 Minutes 10 Minutes         Oxygen   Oxygen Continuous Continuous Continuous Continuous    Liters 8 8-10 8-10 8         Treadmill   MPH 1.8 1.8 1.8 1.8    Grade 0 1 1 3     Minutes 17 17 17 17     METs 2.2 2.61 2.6 3.11  NuStep   Level 3 3 3 3     SPM 85 90 97 91    Minutes 22 22 22 22     METs 2.2 2.3 2.7 2.5           Exercise Comments:   Exercise Comments    Row Name 03/18/20 1443           Exercise Comments Pt completed his first exercise session of pulmonary rehab today. He was able to tolerate exercise well with no complaints. 8L of continuous oxygen was adequate to maintain his oxygen saturations >90%.              Exercise Goals and Review:   Exercise Goals    Row Name 03/14/20 1453 03/18/20 1446 04/08/20 1543 05/06/20 1524 06/03/20 1600     Exercise Goals   Increase Physical Activity Yes Yes Yes Yes Yes   Intervention Provide advice, education, support and counseling about physical activity/exercise needs.;Develop an individualized exercise prescription for aerobic and resistive training based on initial evaluation findings, risk stratification, comorbidities and participant's personal goals. Provide advice, education, support and counseling about physical activity/exercise needs.;Develop an individualized exercise prescription for aerobic and resistive training based on initial evaluation findings, risk stratification, comorbidities and participant's personal goals. Provide advice, education, support and counseling about physical activity/exercise needs.;Develop an individualized exercise prescription for aerobic and resistive training based on initial evaluation findings, risk stratification, comorbidities and participant's personal goals. Provide advice, education, support and counseling about physical activity/exercise needs.;Develop an individualized exercise prescription for aerobic and resistive training based on initial evaluation findings, risk stratification, comorbidities and participant's personal goals. Provide advice, education, support and counseling about physical activity/exercise needs.;Develop an individualized exercise prescription for aerobic and resistive training based  on initial evaluation findings, risk stratification, comorbidities and participant's personal goals.   Expected Outcomes Short Term: Attend rehab on a regular basis to increase amount of physical activity.;Long Term: Add in home exercise to make exercise part of routine and to increase amount of physical activity.;Long Term: Exercising regularly at least 3-5 days a week. Short Term: Attend rehab on a regular basis to increase amount of physical activity.;Long Term: Add in home exercise to make exercise part of routine and to increase amount of physical activity.;Long Term: Exercising regularly at least 3-5 days a week. Short Term: Attend rehab on a regular basis to increase amount of physical activity.;Long Term: Add in home exercise to make exercise part of routine and to increase amount of physical activity.;Long Term: Exercising regularly at least 3-5 days a week. Short Term: Attend rehab on a regular basis to increase amount of physical activity.;Long Term: Add in home exercise to make exercise part of routine and to increase amount of physical activity.;Long Term: Exercising regularly at least 3-5 days a week. Short Term: Attend rehab on a regular basis to increase amount of physical activity.;Long Term: Add in home exercise to make exercise part of routine and to increase amount of physical activity.;Long Term: Exercising regularly at least 3-5 days a week.   Increase Strength and Stamina Yes Yes Yes Yes Yes   Intervention Provide advice, education, support and counseling about physical activity/exercise needs.;Develop an individualized exercise prescription for aerobic and resistive training based on initial evaluation findings, risk stratification, comorbidities and participant's personal goals. Provide advice, education, support and counseling about physical activity/exercise needs.;Develop an individualized exercise prescription for aerobic and resistive training based on initial evaluation findings,  risk stratification, comorbidities and participant's personal goals. Provide advice, education, support  and counseling about physical activity/exercise needs.;Develop an individualized exercise prescription for aerobic and resistive training based on initial evaluation findings, risk stratification, comorbidities and participant's personal goals. Provide advice, education, support and counseling about physical activity/exercise needs.;Develop an individualized exercise prescription for aerobic and resistive training based on initial evaluation findings, risk stratification, comorbidities and participant's personal goals. Provide advice, education, support and counseling about physical activity/exercise needs.;Develop an individualized exercise prescription for aerobic and resistive training based on initial evaluation findings, risk stratification, comorbidities and participant's personal goals.   Expected Outcomes Short Term: Increase workloads from initial exercise prescription for resistance, speed, and METs.;Short Term: Perform resistance training exercises routinely during rehab and add in resistance training at home;Long Term: Improve cardiorespiratory fitness, muscular endurance and strength as measured by increased METs and functional capacity (6MWT) Short Term: Increase workloads from initial exercise prescription for resistance, speed, and METs.;Short Term: Perform resistance training exercises routinely during rehab and add in resistance training at home;Long Term: Improve cardiorespiratory fitness, muscular endurance and strength as measured by increased METs and functional capacity (6MWT) Short Term: Increase workloads from initial exercise prescription for resistance, speed, and METs.;Short Term: Perform resistance training exercises routinely during rehab and add in resistance training at home;Long Term: Improve cardiorespiratory fitness, muscular endurance and strength as measured by increased METs  and functional capacity (6MWT) Short Term: Increase workloads from initial exercise prescription for resistance, speed, and METs.;Short Term: Perform resistance training exercises routinely during rehab and add in resistance training at home;Long Term: Improve cardiorespiratory fitness, muscular endurance and strength as measured by increased METs and functional capacity (6MWT) Short Term: Increase workloads from initial exercise prescription for resistance, speed, and METs.;Short Term: Perform resistance training exercises routinely during rehab and add in resistance training at home;Long Term: Improve cardiorespiratory fitness, muscular endurance and strength as measured by increased METs and functional capacity (6MWT)   Able to understand and use rate of perceived exertion (RPE) scale Yes Yes Yes Yes Yes   Intervention Provide education and explanation on how to use RPE scale Provide education and explanation on how to use RPE scale Provide education and explanation on how to use RPE scale Provide education and explanation on how to use RPE scale Provide education and explanation on how to use RPE scale   Expected Outcomes Short Term: Able to use RPE daily in rehab to express subjective intensity level;Long Term:  Able to use RPE to guide intensity level when exercising independently Short Term: Able to use RPE daily in rehab to express subjective intensity level;Long Term:  Able to use RPE to guide intensity level when exercising independently Short Term: Able to use RPE daily in rehab to express subjective intensity level;Long Term:  Able to use RPE to guide intensity level when exercising independently Short Term: Able to use RPE daily in rehab to express subjective intensity level;Long Term:  Able to use RPE to guide intensity level when exercising independently Short Term: Able to use RPE daily in rehab to express subjective intensity level;Long Term:  Able to use RPE to guide intensity level when  exercising independently   Able to understand and use Dyspnea scale Yes Yes Yes Yes Yes   Intervention Provide education and explanation on how to use Dyspnea scale Provide education and explanation on how to use Dyspnea scale Provide education and explanation on how to use Dyspnea scale Provide education and explanation on how to use Dyspnea scale Provide education and explanation on how to use Dyspnea scale   Expected  Outcomes Short Term: Able to use Dyspnea scale daily in rehab to express subjective sense of shortness of breath during exertion;Long Term: Able to use Dyspnea scale to guide intensity level when exercising independently Short Term: Able to use Dyspnea scale daily in rehab to express subjective sense of shortness of breath during exertion;Long Term: Able to use Dyspnea scale to guide intensity level when exercising independently Short Term: Able to use Dyspnea scale daily in rehab to express subjective sense of shortness of breath during exertion;Long Term: Able to use Dyspnea scale to guide intensity level when exercising independently Short Term: Able to use Dyspnea scale daily in rehab to express subjective sense of shortness of breath during exertion;Long Term: Able to use Dyspnea scale to guide intensity level when exercising independently Short Term: Able to use Dyspnea scale daily in rehab to express subjective sense of shortness of breath during exertion;Long Term: Able to use Dyspnea scale to guide intensity level when exercising independently   Knowledge and understanding of Target Heart Rate Range (THRR) Yes Yes Yes Yes Yes   Intervention Provide education and explanation of THRR including how the numbers were predicted and where they are located for reference Provide education and explanation of THRR including how the numbers were predicted and where they are located for reference Provide education and explanation of THRR including how the numbers were predicted and where they are  located for reference Provide education and explanation of THRR including how the numbers were predicted and where they are located for reference Provide education and explanation of THRR including how the numbers were predicted and where they are located for reference   Expected Outcomes Short Term: Able to state/look up THRR;Long Term: Able to use THRR to govern intensity when exercising independently;Short Term: Able to use daily as guideline for intensity in rehab Short Term: Able to state/look up THRR;Long Term: Able to use THRR to govern intensity when exercising independently;Short Term: Able to use daily as guideline for intensity in rehab Short Term: Able to state/look up THRR;Short Term: Able to use daily as guideline for intensity in rehab;Long Term: Able to use THRR to govern intensity when exercising independently Short Term: Able to state/look up THRR;Short Term: Able to use daily as guideline for intensity in rehab;Long Term: Able to use THRR to govern intensity when exercising independently Short Term: Able to state/look up THRR;Short Term: Able to use daily as guideline for intensity in rehab;Long Term: Able to use THRR to govern intensity when exercising independently   Understanding of Exercise Prescription Yes Yes Yes Yes Yes   Intervention Provide education, explanation, and written materials on patient's individual exercise prescription Provide education, explanation, and written materials on patient's individual exercise prescription Provide education, explanation, and written materials on patient's individual exercise prescription Provide education, explanation, and written materials on patient's individual exercise prescription Provide education, explanation, and written materials on patient's individual exercise prescription   Expected Outcomes Short Term: Able to explain program exercise prescription;Long Term: Able to explain home exercise prescription to exercise independently Short  Term: Able to explain program exercise prescription;Long Term: Able to explain home exercise prescription to exercise independently Short Term: Able to explain program exercise prescription;Long Term: Able to explain home exercise prescription to exercise independently Short Term: Able to explain program exercise prescription;Long Term: Able to explain home exercise prescription to exercise independently Short Term: Able to explain program exercise prescription;Long Term: Able to explain home exercise prescription to exercise independently   [place] Name 07/01/20 1614  Exercise Goals   Increase Physical Activity Yes       Intervention Provide advice, education, support and counseling about physical activity/exercise needs.;Develop an individualized exercise prescription for aerobic and resistive training based on initial evaluation findings, risk stratification, comorbidities and participant's personal goals.       Expected Outcomes Short Term: Attend rehab on a regular basis to increase amount of physical activity.;Long Term: Add in home exercise to make exercise part of routine and to increase amount of physical activity.;Long Term: Exercising regularly at least 3-5 days a week.       Increase Strength and Stamina Yes       Intervention Provide advice, education, support and counseling about physical activity/exercise needs.;Develop an individualized exercise prescription for aerobic and resistive training based on initial evaluation findings, risk stratification, comorbidities and participant's personal goals.       Expected Outcomes Short Term: Increase workloads from initial exercise prescription for resistance, speed, and METs.;Short Term: Perform resistance training exercises routinely during rehab and add in resistance training at home;Long Term: Improve cardiorespiratory fitness, muscular endurance and strength as measured by increased METs and functional capacity (6MWT)       Able to  understand and use rate of perceived exertion (RPE) scale Yes       Intervention Provide education and explanation on how to use RPE scale       Expected Outcomes Short Term: Able to use RPE daily in rehab to express subjective intensity level;Long Term:  Able to use RPE to guide intensity level when exercising independently       Able to understand and use Dyspnea scale Yes       Intervention Provide education and explanation on how to use Dyspnea scale       Expected Outcomes Short Term: Able to use Dyspnea scale daily in rehab to express subjective sense of shortness of breath during exertion;Long Term: Able to use Dyspnea scale to guide intensity level when exercising independently       Knowledge and understanding of Target Heart Rate Range (THRR) Yes       Intervention Provide education and explanation of THRR including how the numbers were predicted and where they are located for reference       Expected Outcomes Short Term: Able to state/look up THRR;Short Term: Able to use daily as guideline for intensity in rehab;Long Term: Able to use THRR to govern intensity when exercising independently       Understanding of Exercise Prescription Yes       Intervention Provide education, explanation, and written materials on patient's individual exercise prescription       Expected Outcomes Short Term: Able to explain program exercise prescription;Long Term: Able to explain home exercise prescription to exercise independently              Exercise Goals Re-Evaluation :  Exercise Goals Re-Evaluation    Row Name 03/18/20 1446 04/08/20 1543 05/06/20 1525 06/03/20 1600 07/01/20 1615     Exercise Goal Re-Evaluation   Exercise Goals Review Increase Physical Activity;Increase Strength and Stamina;Able to understand and use rate of perceived exertion (RPE) scale;Able to understand and use Dyspnea scale;Knowledge and understanding of Target Heart Rate Range (THRR);Understanding of Exercise Prescription  Increase Physical Activity;Increase Strength and Stamina;Able to understand and use rate of perceived exertion (RPE) scale;Able to understand and use Dyspnea scale;Knowledge and understanding of Target Heart Rate Range (THRR);Understanding of Exercise Prescription Increase Strength and Stamina;Increase Physical Activity;Able to understand and use rate  of perceived exertion (RPE) scale;Able to understand and use Dyspnea scale;Knowledge and understanding of Target Heart Rate Range (THRR);Understanding of Exercise Prescription Increase Strength and Stamina;Increase Physical Activity;Able to understand and use rate of perceived exertion (RPE) scale;Able to understand and use Dyspnea scale;Knowledge and understanding of Target Heart Rate Range (THRR);Understanding of Exercise Prescription Increase Strength and Stamina;Increase Physical Activity;Able to understand and use rate of perceived exertion (RPE) scale;Able to understand and use Dyspnea scale;Knowledge and understanding of Target Heart Rate Range (THRR);Understanding of Exercise Prescription   Comments Pt has completed 1 exercise session. 8 L of O2 was able to maintain his oxygen saturations >90%. He is eager to be in the program. He exercised at 1.9 METs on his first day. WIll continue to monitor and progress as able. Pt has completed 7 exercise sessions. He is progressing well and is going to try the treadmill next week. He feels he is getting stronger. He is exercising at 2.1 METs on the stepper. Will continue to monitor and progress exercise as able. Patient has completed 13 exercise sessions. He is tolerating exercise well and progressing well. He just started using the treadmill and has adapted well to that. He is getting stronger as well in regards to the strength training. He is currently exercising at 2.1 METs on the NuStep. Will continue to monitor and progress exercise as able. Pt has completed 21 exercise sessions. We have recently been increasing his  oxygen liter flow to 10 while he is on the treadmill and have been able to maintain his SpO2 levels above 90%. He continues to progress and get stronger. He is currently exercising at 2.3 METs on the stepper. Will continue to monitor and progress as able. Patient has completed 27 exercise sessions. We have recently decreased his liter flow back to 8L on the treadmill. He continues to progress well with exercise and is continuing to get stronger. He is currently exercising at 2.5 METs on the NuStep.Will continue to monitor and progress as able.   Expected Outcomes Through exercise at rehab and by engaging in a home exercise program, the patient will be able to reach their goals. Through exercising at rehab and at home patient will reach their goals. Through exercise at rehab and a home exercise program, patient will achieve their goals. Through exercise at rehab and a home exercise program, patient will achieve their goals. Through exercise at rehab and a home exercise program, patient will achieve their goals.          Discharge Exercise Prescription (Final Exercise Prescription Changes):  Exercise Prescription Changes - 07/01/20 1600      Response to Exercise   Blood Pressure (Admit) 112/70    Blood Pressure (Exercise) 136/76    Blood Pressure (Exit) 106/70    Heart Rate (Admit) 90 bpm    Heart Rate (Exercise) 108 bpm    Heart Rate (Exit) 82 bpm    Oxygen Saturation (Admit) 95 %    Oxygen Saturation (Exercise) 88 %    Oxygen Saturation (Exit) 96 %    Rating of Perceived Exertion (Exercise) 10    Perceived Dyspnea (Exercise) 10    Duration Continue with 30 min of aerobic exercise without signs/symptoms of physical distress.    Intensity THRR unchanged      Progression   Progression Continue to progress workloads to maintain intensity without signs/symptoms of physical distress.      Resistance Training   Training Prescription Yes    Weight 5 lbs  Reps 10-15    Time 10 Minutes       Oxygen   Oxygen Continuous    Liters 8      Treadmill   MPH 1.8    Grade 3    Minutes 17    METs 3.11      NuStep   Level 3    SPM 91    Minutes 22    METs 2.5           Nutrition:  Target Goals: Understanding of nutrition guidelines, daily intake of sodium 1500mg , cholesterol 200mg , calories 30% from fat and 7% or less from saturated fats, daily to have 5 or more servings of fruits and vegetables.  Biometrics:  Pre Biometrics - 07/01/20 1616      Pre Biometrics   Weight 86.3 kg            Nutrition Therapy Plan and Nutrition Goals:  Nutrition Therapy & Goals - 06/20/20 1034      Personal Nutrition Goals   Comments We will continue to provide nutritional education through hand-outs.      Intervention Plan   Intervention Nutrition handout(s) given to patient.           Nutrition Assessments:  Nutrition Assessments - 03/14/20 1429      MEDFICTS Scores   Pre Score 36          MEDIFICTS Score Key:  ?70 Need to make dietary changes   40-70 Heart Healthy Diet  ? 40 Therapeutic Level Cholesterol Diet   Picture Your Plate Scores:  <24 Unhealthy dietary pattern with much room for improvement.  41-50 Dietary pattern unlikely to meet recommendations for good health and room for improvement.  51-60 More healthful dietary pattern, with some room for improvement.   >60 Healthy dietary pattern, although there may be some specific behaviors that could be improved.    Nutrition Goals Re-Evaluation:   Nutrition Goals Discharge (Final Nutrition Goals Re-Evaluation):   Psychosocial: Target Goals: Acknowledge presence or absence of significant depression and/or stress, maximize coping skills, provide positive support system. Participant is able to verbalize types and ability to use techniques and skills needed for reducing stress and depression.  Initial Review & Psychosocial Screening:  Initial Psych Review & Screening - 03/14/20 1310      Initial  Review   Current issues with None Identified      Family Dynamics   Good Support System? Yes    Comments Great support system with wife and his brothers      Barriers   Psychosocial barriers to participate in program There are no identifiable barriers or psychosocial needs.      Screening Interventions   Interventions Encouraged to exercise           Quality of Life Scores:  Quality of Life - 03/14/20 1448      Quality of Life   Select Quality of Life      Quality of Life Scores   Health/Function Pre 13.5 %    Socioeconomic Pre 26.25 %    Psych/Spiritual Pre 14.64 %    Family Pre 26.38 %    GLOBAL Pre 17.18 %          Scores of 19 and below usually indicate a poorer quality of life in these areas.  A difference of  2-3 points is a clinically meaningful difference.  A difference of 2-3 points in the total score of the Quality of Life Index has been associated  with significant improvement in overall quality of life, self-image, physical symptoms, and general health in studies assessing change in quality of life.   PHQ-9: Recent Review Flowsheet Data    Depression screen [facility] 2/9 03/14/2020   Decreased Interest 0   Down, Depressed, Hopeless 0   PHQ - 2 Score 0   Altered sleeping 0   Tired, decreased energy 1   Change in appetite 1   Feeling bad or failure about yourself  0   Trouble concentrating 0   Moving slowly or fidgety/restless 0   Suicidal thoughts 0   PHQ-9 Score 2   Difficult doing work/chores Not difficult at all     Interpretation of Total Score  Total Score Depression Severity:  1-4 = Minimal depression, 5-9 = Mild depression, 10-14 = Moderate depression, 15-19 = Moderately severe depression, 20-27 = Severe depression   Psychosocial Evaluation and Intervention:   Psychosocial Re-Evaluation:  Psychosocial Re-Evaluation    Row Name 03/20/20 0749 04/07/20 1331 05/05/20 0939 05/30/20 0930 06/20/20 1026     Psychosocial Re-Evaluation   Current  issues with None Identified - None Identified - None Identified   Comments Patient's initial QOL score was 17.18% and his PHQ-9 score was 2 with no psychosocial issues identified. He has a strong support system and a good positive outlook. Will continue to monitor. Patient continues to have no psychosocial issues identified. He has a positive attitude and works hard during his sessions. He continues to use O2 but says he is starting to feel stronger. Will continue to monitor for progress. Patient continues to have no psychosocial issues identified. He continues to demonstrate a positive attitude and outlook and works hard during his sessions. He continues to use O2 but says he is starting to feel stronger. Will continue to monitor for progress. Patient continues to have no psychosocial issues identified. He has completed 20 sessions and continues to demonstrate a positive attitude and outlook and works hard during his sessions. He continues to use O2 but says he is starting to feel stronger. Will continue to monitor for progress. Patient continues to have no psychosocial issues identified. He has completed 24 sessions and continues to demonstrate a positive attitude and outlook and works hard during his sessions.  Has increased work load on treadmill and  O2 at 10L while walking on treadmill. This has helped keep his O2 sats up in the lower 90"s. Will continue to monitor for progress. MD ordered CPAP recently.   Expected Outcomes Patient will no have any psychosocial issues identified at discharge. Patient will no have any psychosocial issues identified at discharge. Patient will no have any psychosocial issues identified at discharge. Patient will no have any psychosocial issues identified at discharge. Patient will no have any psychosocial issues identified at discharge.   Interventions Encouraged to attend Pulmonary Rehabilitation for the exercise;Relaxation education;Stress management education Encouraged to  attend Pulmonary Rehabilitation for the exercise;Relaxation education;Stress management education Encouraged to attend Pulmonary Rehabilitation for the exercise;Relaxation education;Stress management education Encouraged to attend Pulmonary Rehabilitation for the exercise;Relaxation education;Stress management education Encouraged to attend Pulmonary Rehabilitation for the exercise;Stress management education;Relaxation education   Continue Psychosocial Services  No Follow up required No Follow up required No Follow up required No Follow up required No Follow up required          Psychosocial Discharge (Final Psychosocial Re-Evaluation):  Psychosocial Re-Evaluation - 06/20/20 1026      Psychosocial Re-Evaluation   Current issues with None Identified  Comments Patient continues to have no psychosocial issues identified. He has completed 24 sessions and continues to demonstrate a positive attitude and outlook and works hard during his sessions.  Has increased work load on treadmill and  O2 at 10L while walking on treadmill. This has helped keep his O2 sats up in the lower 90"s. Will continue to monitor for progress. MD ordered CPAP recently.    Expected Outcomes Patient will no have any psychosocial issues identified at discharge.    Interventions Encouraged to attend Pulmonary Rehabilitation for the exercise;Stress management education;Relaxation education    Continue Psychosocial Services  No Follow up required            Education: Education Goals: Education classes will be provided on a weekly basis, covering required topics. Participant will state understanding/return demonstration of topics presented.  Learning Barriers/Preferences:  Learning Barriers/Preferences - 03/14/20 1312      Learning Barriers/Preferences   Learning Barriers None    Learning Preferences Skilled Demonstration           Education Topics: How Lungs Work and Diseases: - Discuss the anatomy of the lungs  and diseases that can affect the lungs, such as COPD.   Exercise: -Discuss the importance of exercise, FITT principles of exercise, normal and abnormal responses to exercise, and how to exercise safely.   Environmental Irritants: -Discuss types of environmental irritants and how to limit exposure to environmental irritants. Flowsheet Row PULMONARY REHAB OTHER RESPIRATORY from 06/26/2020 in [place]  Date 06/05/20  Educator DF  Instruction Review Code 2- Demonstrated Understanding      Meds/Inhalers and oxygen: - Discuss respiratory medications, definition of an inhaler and oxygen, and the proper way to use an inhaler and oxygen. Flowsheet Row PULMONARY REHAB OTHER RESPIRATORY from 06/26/2020 in [place]  Date 06/12/20  Educator Hand out      Energy Saving Techniques: - Discuss methods to conserve energy and decrease shortness of breath when performing activities of daily living.  Flowsheet Row PULMONARY REHAB OTHER RESPIRATORY from 06/26/2020 in [place]  Date 03/20/20  Educator DF  Instruction Review Code 2- Demonstrated Understanding      Bronchial Hygiene / Breathing Techniques: - Discuss breathing mechanics, pursed-lip breathing technique,  proper posture, effective ways to clear airways, and other functional breathing techniques Flowsheet Row PULMONARY REHAB OTHER RESPIRATORY from 06/26/2020 in [place]  Date 03/27/20  Educator DF  Instruction Review Code 2- Demonstrated Understanding      Cleaning Equipment: - Provides group verbal and written instruction about the health risks of elevated stress, cause of high stress, and healthy ways to reduce stress. Flowsheet Row PULMONARY REHAB OTHER RESPIRATORY from 06/26/2020 in [place]  Date 04/03/20  Educator DJ  Instruction Review Code 1- Verbalizes Understanding      Nutrition I: Fats: - Discuss the  types of cholesterol, what cholesterol does to the body, and how cholesterol levels can be controlled. Flowsheet Row PULMONARY REHAB OTHER RESPIRATORY from 06/26/2020 in [place]  Date 04/10/20  Educator DJ  Instruction Review Code 1- Verbalizes Understanding      Nutrition II: Labels: -Discuss the different components of food labels and how to read food labels. Flowsheet Row PULMONARY REHAB OTHER RESPIRATORY from 06/26/2020 in [place]  Date 04/15/20  Educator PB  Instruction Review Code 2- Demonstrated Understanding      Respiratory Infections: - Discuss the signs and symptoms of respiratory  infections, ways to prevent respiratory infections, and the importance of seeking medical treatment when having a respiratory infection. Flowsheet Row PULMONARY REHAB OTHER RESPIRATORY from 06/26/2020 in [place]  Date 04/24/20  Educator DF  Instruction Review Code 1- Verbalizes Understanding      Stress I: Signs and Symptoms: - Discuss the causes of stress, how stress may lead to anxiety and depression, and ways to limit stress.   Stress II: Relaxation: -Discuss relaxation techniques to limit stress. Flowsheet Row PULMONARY REHAB OTHER RESPIRATORY from 06/26/2020 in [place]  Date 05/08/20  Educator [facility]  Instruction Review Code 2- Demonstrated Understanding      Oxygen for Home/Travel: - Discuss how to prepare for travel when on oxygen and proper ways to transport and store oxygen to ensure safety. Flowsheet Row PULMONARY REHAB OTHER RESPIRATORY from 06/26/2020 in [place]  Date 05/15/20  Educator DJ  Instruction Review Code 1- Verbalizes Understanding      Knowledge Questionnaire Score:  Knowledge Questionnaire Score - 03/14/20 1429      Knowledge Questionnaire Score   Pre Score 5/18           Core Components/Risk Factors/Patient Goals at Admission:   Personal Goals and Risk Factors at Admission - 03/14/20 1314      Core Components/Risk Factors/Patient Goals on Admission    Weight Management Weight Loss;Yes    Admit Weight 193 lb (87.5 kg)    Goal Weight: Short Term 185 lb (83.9 kg)    Expected Outcomes Short Term: Continue to assess and modify interventions until short term weight is achieved;Long Term: Adherence to nutrition and physical activity/exercise program aimed toward attainment of established weight goal;Weight Maintenance: Understanding of the daily nutrition guidelines, which includes 25-35% calories from fat, 7% or less cal from saturated fats, less than 200mg  cholesterol, less than 1.5gm of sodium, & 5 or more servings of fruits and vegetables daily;Weight Loss: Understanding of general recommendations for a balanced deficit meal plan, which promotes 1-2 lb weight loss per week and includes a negative energy balance of [phone] kcal/d;Understanding recommendations for meals to include 15-35% energy as protein, 25-35% energy from fat, 35-60% energy from carbohydrates, less than 200mg  of dietary cholesterol, 20-35 gm of total fiber daily;Understanding of distribution of calorie intake throughout the day with the consumption of 4-5 meals/snacks;Weight Gain: Understanding of general recommendations for a high calorie, high protein meal plan that promotes weight gain by distributing calorie intake throughout the day with the consumption for 4-5 meals, snacks, and/or supplements    Improve shortness of breath with ADL's Yes    Intervention Provide education, individualized exercise plan and daily activity instruction to help decrease symptoms of SOB with activities of daily living.    Expected Outcomes Short Term: Improve cardiorespiratory fitness to achieve a reduction of symptoms when performing ADLs;Long Term: Be able to perform more ADLs without symptoms or delay the onset of symptoms           Core Components/Risk Factors/Patient  Goals Review:   Goals and Risk Factor Review    Row Name 03/20/20 0748 04/07/20 1333 05/05/20 0940 05/30/20 0931 06/20/20 1036     Core Components/Risk Factors/Patient Goals Review   Personal Goals Review Improve shortness of breath with ADL's - Improve shortness of breath with ADL's Improve shortness of breath with ADL's Improve shortness of breath with ADL's   Review Patient is new to the program completing 2 sessions. His personal goals are to be able  to walk to his mailbox and to get off of O2.  Will continue to monitor for progress as he works toward meeting his personal and program goals. Patient has completed 6 sessions losing 1 lb since last 30 day review. He is doing well in the program with progression and consistent attendance. His personal goals are to breathe better; to be able to walk to his mailbox and to get off of O2. He feels he is making progress toward meeting his goals. Patient has completed 12 sessions gaining 1 lb since last 30 day review. He was referred to pulmonary rehab with pulmonary HTN. He continues to do well in the program with progression and consistent attendance. He works very hard during sessions and has started using the treadmill for the past 4 sessions tolerating very well. His personal goals are to breathe better; be able to walk to his mailbox; and to get off of O2. He saw his pulmonologist 05/01/20 for a routine f/u visit. He reported to MD that he was feeling a lot stronger and was able to walk to his mail box and able to climb stairs. Will continue to monitor for progress. Patient has completed 20 sessions losing 1 lb since last 30 day review. He continues to do very well in the program with progression and conisistent attendance. He is doing very well on the treadmill. He started taking a new medication for pulmonary HTN, Opsumit, approved 05/06/20. His personal goals are to breathe better; be able to walk to his mailbox; and get off of O2. We will continue to  monitor his progress as he works toward meeting these goals. Patient has completed 24 sessions losing total of 3.74 lb since starting rehab. He continues to do very well in the program with progression and conisistent attendance. He is doing very well on the treadmill.  His personal goals are to breathe better; be able to walk to his mailbox; and get off of O2. We will continue to monitor his progress as he works toward meeting these goals.   Expected Outcomes Patient will complete the program meeting both personal and program goals. Patient will complete the program meeting both personal and program goals. Patient will complete the program meeting both personal and program goals. Patient will complete the program meeting both personal and program goals. Patient will complete the program meeting both personal and program goals.          Core Components/Risk Factors/Patient Goals at Discharge (Final Review):   Goals and Risk Factor Review - 06/20/20 1036      Core Components/Risk Factors/Patient Goals Review   Personal Goals Review Improve shortness of breath with ADL's    Review Patient has completed 24 sessions losing total of 3.74 lb since starting rehab. He continues to do very well in the program with progression and conisistent attendance. He is doing very well on the treadmill.  His personal goals are to breathe better; be able to walk to his mailbox; and get off of O2. We will continue to monitor his progress as he works toward meeting these goals.    Expected Outcomes Patient will complete the program meeting both personal and program goals.           ITP Comments:   Comments: ITP REVIEW Pt is making expected progress toward pulmonary rehab goals after completing 28 sessions. Recommend continued exercise, life style modification, education, and utilization of breathing techniques to increase stamina and strength and decrease shortness of breath with exertion.

## 2020-07-08 ENCOUNTER — Other Ambulatory Visit: Payer: Self-pay

## 2020-07-08 ENCOUNTER — Encounter (HOSPITAL_COMMUNITY)
Admission: RE | Admit: 2020-07-08 | Discharge: 2020-07-08 | Disposition: A | Discharge status: Home / Self Care | Payer: Medicare Other | Source: Ambulatory Visit | Attending: Internal Medicine | Admitting: Internal Medicine

## 2020-07-08 DIAGNOSIS — R945 Abnormal results of liver function studies: Secondary | ICD-10-CM | POA: Diagnosis not present

## 2020-07-08 DIAGNOSIS — R7301 Impaired fasting glucose: Secondary | ICD-10-CM | POA: Diagnosis not present

## 2020-07-08 DIAGNOSIS — R0902 Hypoxemia: Secondary | ICD-10-CM | POA: Diagnosis not present

## 2020-07-08 DIAGNOSIS — M25551 Pain in right hip: Secondary | ICD-10-CM | POA: Diagnosis not present

## 2020-07-08 DIAGNOSIS — E785 Hyperlipidemia, unspecified: Secondary | ICD-10-CM | POA: Diagnosis not present

## 2020-07-08 DIAGNOSIS — Z125 Encounter for screening for malignant neoplasm of prostate: Secondary | ICD-10-CM | POA: Diagnosis not present

## 2020-07-08 DIAGNOSIS — J449 Chronic obstructive pulmonary disease, unspecified: Secondary | ICD-10-CM | POA: Diagnosis not present

## 2020-07-08 DIAGNOSIS — C44310 Basal cell carcinoma of skin of unspecified parts of face: Secondary | ICD-10-CM | POA: Diagnosis not present

## 2020-07-08 DIAGNOSIS — Z8739 Personal history of other diseases of the musculoskeletal system and connective tissue: Secondary | ICD-10-CM | POA: Diagnosis not present

## 2020-07-08 DIAGNOSIS — J9611 Chronic respiratory failure with hypoxia: Secondary | ICD-10-CM | POA: Diagnosis not present

## 2020-07-08 DIAGNOSIS — I272 Pulmonary hypertension, unspecified: Secondary | ICD-10-CM | POA: Diagnosis not present

## 2020-07-08 DIAGNOSIS — R0602 Shortness of breath: Secondary | ICD-10-CM | POA: Diagnosis not present

## 2020-07-08 NOTE — Progress Notes (Signed)
Daily Session Note  Patient Details  Name: Warren [name] MRN: [id number] Date of Birth: 08-Sep-1950 Referring Provider:   Flowsheet Row PULMONARY REHAB OTHER RESP ORIENTATION from 03/14/2020 in [place]  Referring Provider Dr. Haroldine Laws      Encounter Date: 07/08/2020  Check In:  Session Check In - 07/08/20 1350      Check-In   Supervising physician immediately available to respond to emergencies [facility] MD immediately available    Physician(s) Dr. Johnsie Cancel    Location [facility]    Staff Present Hoy Register, MS, ACSM-CEP, Exercise Physiologist;Madison Audria Nine, MS, Exercise Physiologist;Phyllis Billingsley, RN    Virtual Visit No    Medication changes reported     No    Fall or balance concerns reported    No    Tobacco Cessation No Change    Warm-up and Cool-down Performed as group-led instruction    Resistance Training Performed Yes    VAD Patient? No    PAD/SET Patient? No      Pain Assessment   Currently in Pain? No/denies    Pain Score 0-No pain    Multiple Pain Sites No           Capillary Blood Glucose: No results found for this or any previous visit (from the past 24 hour(s)).    Social History   Tobacco Use  Smoking Status Former Smoker  . Quit date: 05/24/2002  . Years since quitting: 18.1  Smokeless Tobacco Never Used    Goals Met:  Independence with exercise equipment Exercise tolerated well No report of cardiac concerns or symptoms Strength training completed today  Goals Unmet:  Not Applicable  Comments: checkout time is 1430   Dr. Kathie Dike is Medical Director for [facility].

## 2020-07-09 ENCOUNTER — Other Ambulatory Visit: Payer: Self-pay

## 2020-07-09 ENCOUNTER — Ambulatory Visit: Payer: Medicare Other | Attending: Cardiology | Admitting: Cardiology

## 2020-07-09 DIAGNOSIS — R0902 Hypoxemia: Secondary | ICD-10-CM | POA: Insufficient documentation

## 2020-07-09 DIAGNOSIS — G4733 Obstructive sleep apnea (adult) (pediatric): Secondary | ICD-10-CM | POA: Diagnosis not present

## 2020-07-09 DIAGNOSIS — G4734 Idiopathic sleep related nonobstructive alveolar hypoventilation: Secondary | ICD-10-CM

## 2020-07-10 ENCOUNTER — Encounter (HOSPITAL_COMMUNITY)
Admission: RE | Admit: 2020-07-10 | Discharge: 2020-07-10 | Disposition: A | Discharge status: Home / Self Care | Payer: Medicare Other | Source: Ambulatory Visit | Attending: Internal Medicine | Admitting: Internal Medicine

## 2020-07-10 DIAGNOSIS — I272 Pulmonary hypertension, unspecified: Secondary | ICD-10-CM | POA: Diagnosis not present

## 2020-07-10 NOTE — Progress Notes (Signed)
Daily Session Note  Patient Details  Name: Warren [name] MRN: [id number] Date of Birth: 02-Oct-1950 Referring Provider:   Flowsheet Row PULMONARY REHAB OTHER RESP ORIENTATION from 03/14/2020 in [place]  Referring Provider Dr. Haroldine Laws      Encounter Date: 07/10/2020  Check In:  Session Check In - 07/10/20 1330      Check-In   Supervising physician immediately available to respond to emergencies [facility] MD immediately available    Physician(s) Dr. Johnsie Cancel    Location [facility]    Staff Present Aundra Dubin, RN, BSN;Madison Audria Nine, MS, Exercise Physiologist;Dalton Kris Mouton, MS, ACSM-CEP, Exercise Physiologist    Virtual Visit No    Medication changes reported     No    Fall or balance concerns reported    No    Tobacco Cessation No Change    Warm-up and Cool-down Performed as group-led instruction    VAD Patient? No    PAD/SET Patient? No      Pain Assessment   Currently in Pain? Yes    Pain Score 6     Pain Location Hip    Pain Orientation Right    Pain Descriptors / Indicators Constant    Pain Type Chronic pain    Pain Onset More than a month ago    Pain Frequency Constant    Multiple Pain Sites No           Capillary Blood Glucose: No results found for this or any previous visit (from the past 24 hour(s)).    Social History   Tobacco Use  Smoking Status Former Smoker  . Quit date: 05/24/2002  . Years since quitting: 18.1  Smokeless Tobacco Never Used    Goals Met:  Proper associated with RPD/PD & O2 Sat Independence with exercise equipment Improved SOB with ADL's Using PLB without cueing & demonstrates good technique Exercise tolerated well No report of cardiac concerns or symptoms Strength training completed today  Goals Unmet:  Not Applicable  Comments: Check out 1430.   Dr. Kathie Dike is Medical Director for [facility].

## 2020-07-15 ENCOUNTER — Encounter (HOSPITAL_COMMUNITY)
Admission: RE | Admit: 2020-07-15 | Discharge: 2020-07-15 | Disposition: A | Discharge status: Home / Self Care | Payer: Medicare Other | Source: Ambulatory Visit | Attending: Internal Medicine | Admitting: Internal Medicine

## 2020-07-15 ENCOUNTER — Other Ambulatory Visit: Payer: Self-pay

## 2020-07-15 VITALS — Wt 191.6 lb

## 2020-07-15 DIAGNOSIS — R911 Solitary pulmonary nodule: Secondary | ICD-10-CM | POA: Diagnosis not present

## 2020-07-15 DIAGNOSIS — Z8739 Personal history of other diseases of the musculoskeletal system and connective tissue: Secondary | ICD-10-CM | POA: Diagnosis not present

## 2020-07-15 DIAGNOSIS — G4733 Obstructive sleep apnea (adult) (pediatric): Secondary | ICD-10-CM | POA: Diagnosis not present

## 2020-07-15 DIAGNOSIS — K219 Gastro-esophageal reflux disease without esophagitis: Secondary | ICD-10-CM | POA: Diagnosis not present

## 2020-07-15 DIAGNOSIS — I272 Pulmonary hypertension, unspecified: Secondary | ICD-10-CM

## 2020-07-15 DIAGNOSIS — J449 Chronic obstructive pulmonary disease, unspecified: Secondary | ICD-10-CM | POA: Diagnosis not present

## 2020-07-15 DIAGNOSIS — R0902 Hypoxemia: Secondary | ICD-10-CM | POA: Diagnosis not present

## 2020-07-15 NOTE — Progress Notes (Signed)
Daily Session Note  Patient Details  Name: Warren [name] MRN: [id number] Date of Birth: 06/08/1950 Referring Provider:   Flowsheet Row PULMONARY REHAB OTHER RESP ORIENTATION from 03/14/2020 in [place]  Referring Provider Dr. Haroldine Laws      Encounter Date: 07/15/2020  Check In:  Session Check In - 07/15/20 1330      Check-In   Supervising physician immediately available to respond to emergencies [facility] MD immediately available    Physician(s) Dr. Domenic Polite    Location [facility]    Staff Present Geanie Cooley, RN;Madison Audria Nine, MS, Exercise Physiologist;Dalton Kris Mouton, MS, ACSM-CEP, Exercise Physiologist    Virtual Visit No    Medication changes reported     No    Fall or balance concerns reported    No    Tobacco Cessation No Change    Warm-up and Cool-down Performed as group-led instruction    Resistance Training Performed Yes    VAD Patient? No    PAD/SET Patient? No      Pain Assessment   Currently in Pain? Yes    Pain Score 6     Pain Location Hip    Pain Orientation Right    Pain Descriptors / Indicators Constant    Pain Type Chronic pain    Pain Onset More than a month ago    Pain Frequency Constant    Multiple Pain Sites No           Capillary Blood Glucose: No results found for this or any previous visit (from the past 24 hour(s)).    Social History   Tobacco Use  Smoking Status Former Smoker  . Quit date: 05/24/2002  . Years since quitting: 18.1  Smokeless Tobacco Never Used    Goals Met:  Proper associated with RPD/PD & O2 Sat Independence with exercise equipment Improved SOB with ADL's Exercise tolerated well No report of cardiac concerns or symptoms Strength training completed today  Goals Unmet:  Not Applicable  Comments: check out @ 2:30pm   Dr. Kathie Dike is Medical Director for [facility].

## 2020-07-15 NOTE — Procedures (Signed)
   Patient Name: Warren [name], Warren [name] Date: 07/09/2020 Gender: Male D.O.B: 1950-12-29 Age (years): 11 Referring Provider: Fransico Him MD, ABSM Height (inches): 69 Interpreting Physician: Fransico Him MD, ABSM Weight (lbs): 193 RPSGT: Peak, Robert BMI: 28 MRN: [id number] Neck Size: 16.00  CLINICAL INFORMATION The patient is referred for a BiPAP titration to treat sleep apnea.  SLEEP STUDY TECHNIQUE As per the AASM Manual for the Scoring of Sleep and Associated Events v2.3 (April 2016) with a hypopnea requiring 4% desaturations.  The channels recorded and monitored were frontal, central and occipital EEG, electrooculogram (EOG), submentalis EMG (chin), nasal and oral airflow, thoracic and abdominal wall motion, anterior tibialis EMG, snore microphone, electrocardiogram, and pulse oximetry. Bilevel positive airway pressure (BPAP) was initiated at the beginning of the study and titrated to treat sleep-disordered breathing.  MEDICATIONS Medications self-administered by patient taken the night of the study : N/A  RESPIRATORY PARAMETERS Optimal IPAP Pressure (cm): 9  AHI at Optimal Pressure (/hr) 0 Optimal EPAP Pressure (cm):5  Overall Minimal O2 (%):87.0  Minimal O2 at Optimal Pressure (%): 87.0  SLEEP ARCHITECTURE Start Time:10:47:57 PM  Stop Time:5:50:30 AM  Total Time (min):422.6  Total Sleep Time (min):252.8 Sleep Latency (min):28.2  Sleep Efficiency (%):59.8%  REM Latency (min):344.5  WASO (min):141.5 Stage N1 (%):5.7%  Stage N2 (%):84.4%  Stage N3 (%):6.7% Stage R (%):3.2 Supine (%):0.00  Arousal Index (/hr):8.8   CARDIAC DATA The 2 lead EKG demonstrated sinus rhythm. The mean heart rate was 89.1 beats per minute. Other EKG findings include: PVCs.  LEG MOVEMENT DATA The total Periodic Limb Movements of Sleep (PLMS) were 0. The PLMS index was 0.0. A PLMS index of <15 is considered normal in adults.  IMPRESSIONS - An optimal PAP pressure could not be selected for  this patient based on the available study data. - Central sleep apnea was not noted during this titration (CAI = 0.0/h). - Mild oxygen desaturations were observed during this titration (min O2 = 87.0%). - No snoring was audible during this study. - 2-lead EKG demonstrated: PVCs - Clinically significant periodic limb movements were not noted during this study. Arousals associated with PLMs were rare.  DIAGNOSIS - Obstructive Sleep Apnea (G47.33) - Nocturnal Hypoxemia  RECOMMENDATIONS - Recommend Bipap 9/5cm H2O with heated humidity and mask of choice with 3L O2 nightly - Avoid alcohol, sedatives and other CNS depressants that may worsen sleep apnea and disrupt normal sleep architecture. - Sleep hygiene should be reviewed to assess factors that may improve sleep quality. - Weight management and regular exercise should be initiated or continued. - Return to Sleep Center for re-evaluation after 6 weeks of therapy. - Overnight Pulse ox on BiPAP to evaluating for persistent nocturnal hypoxemia.  [Electronically signed] 07/15/2020 12:59 PM  Fransico Him MD, ABSM Diplomate, American Board of Sleep Medicine

## 2020-07-16 ENCOUNTER — Other Ambulatory Visit: Payer: Self-pay

## 2020-07-16 DIAGNOSIS — G4733 Obstructive sleep apnea (adult) (pediatric): Secondary | ICD-10-CM

## 2020-07-16 NOTE — Progress Notes (Signed)
it

## 2020-07-17 ENCOUNTER — Other Ambulatory Visit: Payer: Self-pay

## 2020-07-17 ENCOUNTER — Encounter (HOSPITAL_COMMUNITY)
Admission: RE | Admit: 2020-07-17 | Discharge: 2020-07-17 | Disposition: A | Discharge status: Home / Self Care | Payer: Medicare Other | Source: Ambulatory Visit | Attending: Internal Medicine | Admitting: Internal Medicine

## 2020-07-17 VITALS — Ht 69.0 in | Wt 191.4 lb

## 2020-07-17 DIAGNOSIS — I272 Pulmonary hypertension, unspecified: Secondary | ICD-10-CM | POA: Diagnosis not present

## 2020-07-17 NOTE — Progress Notes (Signed)
Daily Session Note  Patient Details  Name: Warren [name] MRN: [id number] Date of Birth: 06/25/1950 Referring Provider:   Flowsheet Row PULMONARY REHAB OTHER RESP ORIENTATION from 03/14/2020 in [place] [facility]  Referring Provider Dr. Bensimhon      Encounter Date: 07/17/2020  Check In:  Session Check In - 07/17/20 1330      Check-In   Supervising physician immediately available to respond to emergencies [facility] MD immediately available    Physician(s) Dr. McDowell    Location [facility]    Staff Present Madison Karch, MS, Exercise Physiologist;Debra Johnson, RN, BSN;Dalton Fletcher, MS, ACSM-CEP, Exercise Physiologist    Virtual Visit No    Medication changes reported     No    Fall or balance concerns reported    No    Tobacco Cessation No Change    Warm-up and Cool-down Performed as group-led instruction    Resistance Training Performed Yes    VAD Patient? No    PAD/SET Patient? No      Pain Assessment   Currently in Pain? No/denies    Pain Score 6            Capillary Blood Glucose: No results found for this or any previous visit (from the past 24 hour(s)).    Social History   Tobacco Use  Smoking Status Former Smoker  . Quit date: 05/24/2002  . Years since quitting: 18.1  Smokeless Tobacco Never Used    Goals Met:  Independence with exercise equipment Exercise tolerated well No report of cardiac concerns or symptoms Strength training completed today  Goals Unmet:  Not Applicable  Comments: check out 1430   Dr. Jehanzeb Memon is Medical Director for [place] [facility]. 

## 2020-07-21 DIAGNOSIS — M25551 Pain in right hip: Secondary | ICD-10-CM | POA: Diagnosis not present

## 2020-07-21 DIAGNOSIS — G4700 Insomnia, unspecified: Secondary | ICD-10-CM | POA: Diagnosis not present

## 2020-07-21 DIAGNOSIS — E669 Obesity, unspecified: Secondary | ICD-10-CM | POA: Diagnosis not present

## 2020-07-21 DIAGNOSIS — I10 Essential (primary) hypertension: Secondary | ICD-10-CM | POA: Diagnosis not present

## 2020-07-21 DIAGNOSIS — H9193 Unspecified hearing loss, bilateral: Secondary | ICD-10-CM | POA: Diagnosis not present

## 2020-07-21 DIAGNOSIS — R911 Solitary pulmonary nodule: Secondary | ICD-10-CM | POA: Diagnosis not present

## 2020-07-21 DIAGNOSIS — Z8739 Personal history of other diseases of the musculoskeletal system and connective tissue: Secondary | ICD-10-CM | POA: Diagnosis not present

## 2020-07-21 DIAGNOSIS — M109 Gout, unspecified: Secondary | ICD-10-CM | POA: Diagnosis not present

## 2020-07-21 DIAGNOSIS — F430 Acute stress reaction: Secondary | ICD-10-CM | POA: Diagnosis not present

## 2020-07-21 DIAGNOSIS — M79671 Pain in right foot: Secondary | ICD-10-CM | POA: Diagnosis not present

## 2020-07-21 DIAGNOSIS — E782 Mixed hyperlipidemia: Secondary | ICD-10-CM | POA: Diagnosis not present

## 2020-07-25 ENCOUNTER — Telehealth: Payer: Self-pay | Admitting: *Deleted

## 2020-07-25 DIAGNOSIS — G4733 Obstructive sleep apnea (adult) (pediatric): Secondary | ICD-10-CM

## 2020-07-25 NOTE — Telephone Encounter (Signed)
Informed patient of sleep study results and patient understanding was verbalized. Patient understands dr Alinda Dooms Bipap 9/5cm H2O with heated humidity and mask of choice with 3L O2 nightly. overnight Pulse Ox on O2 at 3L and BiPAP    Upon patient request DME selection is [employer]. Patient understands she/he will be contacted by [facility] to set up her/he cpap. Patient understands to call if [facility] does not contact her/he with new setup in a timely manner. Patient understands they will be called once confirmation has been received from [facility] that they have received their new machine to schedule 10 week follow up appointment.   [facility] notified of new cpap order  Please add to airview Patient was grateful for the call and thanked me.

## 2020-07-25 NOTE — Telephone Encounter (Signed)
-----   Message from Warren Margarita, MD sent at 07/15/2020  1:02 PM EST ----- Please let patient know that th Needs overnight Pulse Ox on O2 at 3L and BiPAP

## 2020-07-30 ENCOUNTER — Other Ambulatory Visit (HOSPITAL_COMMUNITY): Payer: Self-pay | Admitting: Internal Medicine

## 2020-08-20 DIAGNOSIS — G4733 Obstructive sleep apnea (adult) (pediatric): Secondary | ICD-10-CM | POA: Diagnosis not present

## 2020-08-20 DIAGNOSIS — I272 Pulmonary hypertension, unspecified: Secondary | ICD-10-CM | POA: Diagnosis not present

## 2020-08-20 DIAGNOSIS — I10 Essential (primary) hypertension: Secondary | ICD-10-CM | POA: Diagnosis not present

## 2020-08-20 DIAGNOSIS — K219 Gastro-esophageal reflux disease without esophagitis: Secondary | ICD-10-CM | POA: Diagnosis not present

## 2020-08-20 DIAGNOSIS — Z8739 Personal history of other diseases of the musculoskeletal system and connective tissue: Secondary | ICD-10-CM | POA: Diagnosis not present

## 2020-08-20 DIAGNOSIS — J449 Chronic obstructive pulmonary disease, unspecified: Secondary | ICD-10-CM | POA: Diagnosis not present

## 2020-08-20 DIAGNOSIS — R0902 Hypoxemia: Secondary | ICD-10-CM | POA: Diagnosis not present

## 2020-08-30 NOTE — Progress Notes (Signed)
Advanced [facility] Clinic Note   Date:  09/01/2020   ID:  Warren [name], DOB 12-Apr-1951, MRN [id number]  Location: Home  Provider location: [facility] Type of Visit: Established patient  PCP:  [name], MD  Cardiologist:  [name], MD Primary HF: A[place] Tadros  Chief Complaint: Heart Failure follow-up   History of Present Illness:  Warren [name] a 70 y.o.malewith a hx of HTN, HLD, pulmonary hypertension referred by Dr. [name] for further management of Warren [name].   He has had a very complete w/u by Dr. [name]. He has had rather rapid onset of SOB associated with severe diffusion defect on PFTs with normal spirometry. CT negative for ILD. Echo showed normal EF with moderate to severe pHTN. VQ scan was negative. His basic rheumatologic work-up was negative.   Underwent R/L cath 12/14/19 as below and started on Opsumit. At last visit sildenafil increased to 40 tid.   He is here for routine f/u. Says he feels good. On Macitentan 10 daily and sildenafil 40 tid. No problem with ADLs and general activity. Feels much better. Ne edema, syncope or presyncope. Wearing O2. 2-3L. sats > 90% but will come down if walking up a steep incline.     Mild OSA 1/22: AHI 5.1/hr. Sats down to 77%  Echo 05/01/20 EF 50-55% RV mildly dilated mild HK RVSP ~53mmHG  PAH therapy 1. Macitentan 10mg   Started 8/21 2. Sildenafil 40 tid Started 10/21  Problem List/Studies 1.Moderate to severe pulmonary HTN -RVSP 44 -> 68 (2020->2021) -PFT 08/31/2019: no obstruction, severe diffusion defect (DCLO 25%) -CT negative for ILD -ANA negative, anti-CCP negative, anti-RA negative -VQ scan negative CTEPH 2.HLD -T chol 140, HDL 40, TG 82, LDL 78 3. HTN -A1c 5.9 4.CAD -Three-vessel calcification seen on recent lung CT -Nuclear medicine stress test normal September 2020  R/L cath 12/14/19  EF 55-60%   Prox RCA lesion is 20% stenosed.  Mid LAD lesion is 40%  stenosed.  Findings:  Ao = 120/71 (95) LV = 117/8 RA = 5 RV = 67/6 PA = 68/28 (44) PCW = 6 Fick cardiac output/index = 4.9/2.4 PVR = 7.8 WU FA sat = 92% PA sat = 69%, 70%   Warren [name] denies symptoms worrisome for COVID 19.   Past Medical History:  Diagnosis Date  . CHF (congestive heart failure) ([place])   . Coronary artery disease   . Hyperlipidemia   . Hypertension    Past Surgical History:  Procedure Laterality Date  . CARDIAC CATHETERIZATION    . RIGHT/LEFT HEART CATH AND CORONARY ANGIOGRAPHY N/A 12/14/2019   Procedure: RIGHT/LEFT HEART CATH AND CORONARY ANGIOGRAPHY;  Surgeon: Jolaine Artist, MD;  Location: [facility];  Service: Cardiovascular;  Laterality: N/A;     Current Outpatient Medications  Medication Sig Dispense Refill  . acetaminophen (TYLENOL) 500 MG tablet Take 1,000 mg by mouth every 6 (six) hours as needed for moderate pain or headache.    Marland Kitchen aspirin EC 81 MG tablet Take 1 tablet (81 mg total) by mouth daily. 90 tablet 3  . ezetimibe (ZETIA) 10 MG tablet Take 10 mg by mouth daily.    . hydrochlorothiazide (HYDRODIURIL) 25 MG tablet Take 25 mg by mouth daily.     . macitentan (OPSUMIT) 10 MG tablet Take 1 tablet (10 mg total) by mouth daily. 30 tablet 11  . OXYGEN Inhale 2-3 L into the lungs continuous. 2 L at rest and  with activity 3 L    . pantoprazole (PROTONIX) 40 MG tablet Take 40 mg by mouth daily.    . potassium chloride (KLOR-CON) 10 MEQ tablet Take 2 tablets (20 mEq total) by mouth daily. 90 tablet 2  . rosuvastatin (CRESTOR) 5 MG tablet Take 1 tablet (5 mg total) by mouth daily. 30 tablet 3  . sildenafil (REVATIO) 20 MG tablet Take 2 tablets (40 mg total) by mouth 3 (three) times daily. 180 tablet 11   No current facility-administered medications for this encounter.   Facility-Administered Medications Ordered in Other Encounters  Medication Dose Route Frequency Provider Last Rate Last Admin  . sodium chloride flush (NS) 0.9 %  injection 10 mL  10 mL Intravenous PRN [name], Cassie Freer, MD   20 mL at 12/05/19 1125    Allergies:   Amlodipine besylate, Penicillins, and Valsartan   Social History:  The patient  reports that he quit smoking about 18 years ago. He has never used smokeless tobacco. He reports previous alcohol use. He reports that he does not use drugs.   Family History:  The patient's family history includes Heart disease in his mother.   ROS:  Please see the history of present illness.   All other systems are personally reviewed and negative.   Vitals:   09/01/20 1542  BP: 122/82  Pulse: 81  SpO2: 94%  Weight: 86.9 kg (191 lb 9.6 oz)    Exam:  General:  Well appearing. No resp difficulty. On O2 HEENT: normal Neck: supple. no JVD. Carotids 2+ bilat; no bruits. No lymphadenopathy or thryomegaly appreciated. Cor: PMI nondisplaced. Regular rate & rhythm. No rubs, gallops or murmurs. Lungs: clear Abdomen: soft, nontender, nondistended. No hepatosplenomegaly. No bruits or masses. Good bowel sounds. Extremities: no cyanosis, clubbing, rash, edema Neuro: alert & orientedx3, cranial nerves grossly intact. moves all 4 extremities w/o difficulty. Affect pleasant   Recent Labs: 10/29/2019: Pro B Natriuretic peptide (BNP) 145.0 01/01/2020: ALT 44; B Natriuretic Peptide 39.1; Hemoglobin 16.1; Platelets 208 02/07/2020: BUN 19; Creatinine, Ser 0.97; Potassium 3.9; Sodium 138  Personally reviewed   Wt Readings from Last 3 Encounters:  09/01/20 86.9 kg (191 lb 9.6 oz)  07/17/20 86.8 kg (191 lb 5.8 oz)  07/15/20 86.9 kg (191 lb 9.3 oz)      ASSESSMENT AND PLAN:  1.Moderate to severe pulmonary HTN - PFT 08/31/2019: no obstruction/restriction, severe diffusion defect (DCLO 25%) - Echo 9/20 LVEF normal. RV mild to moderate HK  - Echo today 05/01/20 EF 50-55% RV mildly to moderately HK  - CT negative for ILD - ANA negative, anti-CCP negative, anti-RA negative - VQ scan negative CTEPH - Sleep study with  mild PAH. Follows with Dr. Radford Pax.   - WHO Group I - likely IPAH - Stable NYHA II-early III - 6MW 01/02/20 251m with sats down to 79% - 6MW 03/14/20 210m (stopped for 1 min due to drop in O2) - Reveal Lite 2.0 Risk Score 5 (low to intermediate risk)  - On macitentan 10 tolerating well - Increase sildenafil to 60 tid - Has finished Pulmonray rehab - Will need repeat 6MW and echo. If still with RV strain or moderate risk would repeat RHC with probable addition of selexipag soon   2. Chronic respiratory failure due to [facility] - continue home O2. Follow sats with pulse ok and titrate to keep sats >= 90% - keep sats > 90%  3.CAD -Three-vessel calcification seen on recent lung CT - Minimal CAD by cath  -  No s/s ischemia - Followed by Dr. [name]. Has been intolerant of statins.    Signed, Glori Bickers, MD  09/01/2020 3:56 PM  [facility] [address] [facility] [place] [address] [phone] (office) [phone] (fax)

## 2020-09-01 ENCOUNTER — Ambulatory Visit (HOSPITAL_COMMUNITY)
Admission: RE | Admit: 2020-09-01 | Discharge: 2020-09-01 | Disposition: A | Discharge status: Home / Self Care | Payer: Medicare Other | Source: Ambulatory Visit | Attending: Internal Medicine | Admitting: Internal Medicine

## 2020-09-01 ENCOUNTER — Encounter (HOSPITAL_COMMUNITY): Payer: Self-pay | Admitting: Internal Medicine

## 2020-09-01 ENCOUNTER — Other Ambulatory Visit: Payer: Self-pay

## 2020-09-01 VITALS — BP 122/82 | HR 81 | Wt 191.6 lb

## 2020-09-01 DIAGNOSIS — E785 Hyperlipidemia, unspecified: Secondary | ICD-10-CM | POA: Diagnosis not present

## 2020-09-01 DIAGNOSIS — Z8249 Family history of ischemic heart disease and other diseases of the circulatory system: Secondary | ICD-10-CM | POA: Insufficient documentation

## 2020-09-01 DIAGNOSIS — Z79899 Other long term (current) drug therapy: Secondary | ICD-10-CM | POA: Diagnosis not present

## 2020-09-01 DIAGNOSIS — I509 Heart failure, unspecified: Secondary | ICD-10-CM | POA: Insufficient documentation

## 2020-09-01 DIAGNOSIS — I5032 Chronic diastolic (congestive) heart failure: Secondary | ICD-10-CM

## 2020-09-01 DIAGNOSIS — J9610 Chronic respiratory failure, unspecified whether with hypoxia or hypercapnia: Secondary | ICD-10-CM | POA: Diagnosis not present

## 2020-09-01 DIAGNOSIS — I272 Pulmonary hypertension, unspecified: Secondary | ICD-10-CM

## 2020-09-01 DIAGNOSIS — Z7982 Long term (current) use of aspirin: Secondary | ICD-10-CM | POA: Diagnosis not present

## 2020-09-01 DIAGNOSIS — Z87891 Personal history of nicotine dependence: Secondary | ICD-10-CM | POA: Insufficient documentation

## 2020-09-01 DIAGNOSIS — Z9981 Dependence on supplemental oxygen: Secondary | ICD-10-CM | POA: Diagnosis not present

## 2020-09-01 DIAGNOSIS — I2510 Atherosclerotic heart disease of native coronary artery without angina pectoris: Secondary | ICD-10-CM

## 2020-09-01 DIAGNOSIS — I110 Hypertensive heart disease with heart failure: Secondary | ICD-10-CM | POA: Insufficient documentation

## 2020-09-01 LAB — CBC
HCT: 47 % (ref 39.0–52.0)
Hemoglobin: 15.9 g/dL (ref 13.0–17.0)
MCH: 30.5 pg (ref 26.0–34.0)
MCHC: 33.8 g/dL (ref 30.0–36.0)
MCV: 90 fL (ref 80.0–100.0)
Platelets: 206 10*3/uL (ref 150–400)
RBC: 5.22 MIL/uL (ref 4.22–5.81)
RDW: 12.8 % (ref 11.5–15.5)
WBC: 8.4 10*3/uL (ref 4.0–10.5)
nRBC: 0 % (ref 0.0–0.2)

## 2020-09-01 LAB — BASIC METABOLIC PANEL
Anion gap: 10 (ref 5–15)
BUN: 18 mg/dL (ref 8–23)
CO2: 27 mmol/L (ref 22–32)
Calcium: 9.8 mg/dL (ref 8.9–10.3)
Chloride: 102 mmol/L (ref 98–111)
Creatinine, Ser: 1.14 mg/dL (ref 0.61–1.24)
GFR, Estimated: >60 mL/min (ref >60)
Glucose, Bld: 82 mg/dL (ref 70–99)
Potassium: 4.1 mmol/L (ref 3.5–5.1)
Sodium: 139 mmol/L (ref 135–145)

## 2020-09-01 LAB — BRAIN NATRIURETIC PEPTIDE: B Natriuretic Peptide: 18.4 pg/mL (ref 0.0–100.0)

## 2020-09-01 MED ORDER — SILDENAFIL CITRATE 20 MG PO TABS: 60.0000 mg | ORAL_TABLET | Freq: Three times a day (TID) | ORAL | 11 refills | Status: DC

## 2020-09-01 NOTE — Patient Instructions (Signed)
Increase Sildenafil to 60 mg (3 tabs) Three times a day   Labs done today, your results will be available in MyChart, we will contact you for abnormal readings.  Your physician has requested that you have an echocardiogram. Echocardiography is a painless test that uses sound waves to create images of your heart. It provides your doctor with information about the size and shape of your heart and how well your heart's chambers and valves are working. This procedure takes approximately one hour. There are no restrictions for this procedure.  Your physician recommends that you schedule a follow-up appointment in: 4 months  If you have any questions or concerns before your next appointment please send [place] a message through [place] or call our office at [phone].    TO LEAVE A MESSAGE FOR THE NURSE SELECT OPTION 2, PLEASE LEAVE A MESSAGE INCLUDING: . YOUR NAME . DATE OF BIRTH . CALL BACK NUMBER . REASON FOR CALL**this is important as we prioritize the call backs  [place] AS LONG AS YOU CALL BEFORE 4:00 PM  At the [facility], you and your health needs are our priority. As part of our continuing mission to provide you with exceptional heart care, we have created designated Provider Care Teams. These Care Teams include your primary Cardiologist (physician) and Advanced Practice Providers (APPs- Physician Assistants and Nurse Practitioners) who all work together to provide you with the care you need, when you need it.   You may see any of the following providers on your designated Care Team at your next follow up: Marland Kitchen Dr Glori Bickers . Dr Loralie Champagne . Dr Vickki Muff . Darrick Grinder, NP . Lyda Jester, [place] . Audry Riles, PharmD   Please be sure to bring in all your medications bottles to every appointment.

## 2020-09-01 NOTE — Addendum Note (Signed)
Encounter addended by: Stanford Scotland, RN on: 09/01/2020 4:36 PM  Actions taken: Clinical Note Signed

## 2020-09-01 NOTE — Progress Notes (Signed)
6 Min Walk Test Completed  Pt ambulated 457.2 meters O2 Sat ranged 80%-93%  on 3-8L oxygen HR ranged 80-122

## 2020-09-08 ENCOUNTER — Other Ambulatory Visit (HOSPITAL_COMMUNITY): Payer: Self-pay | Admitting: *Deleted

## 2020-09-08 MED ORDER — SILDENAFIL CITRATE 20 MG PO TABS
60.0000 mg | ORAL_TABLET | Freq: Three times a day (TID) | ORAL | 11 refills | Status: DC
Start: 2020-09-08 — End: 2021-08-17

## 2020-09-10 ENCOUNTER — Other Ambulatory Visit: Payer: Self-pay

## 2020-09-10 MED ORDER — ROSUVASTATIN CALCIUM 5 MG PO TABS: 5.0000 mg | ORAL_TABLET | Freq: Every day | ORAL | 5 refills | Status: DC

## 2020-09-11 ENCOUNTER — Telehealth (HOSPITAL_COMMUNITY): Payer: Self-pay | Admitting: Pharmacist

## 2020-09-11 ENCOUNTER — Other Ambulatory Visit (HOSPITAL_COMMUNITY): Payer: Self-pay

## 2020-09-11 NOTE — Telephone Encounter (Signed)
Patient Advocate Encounter   Received notification from [facility] that prior authorization/quantity limit exception for sildenafil is required.   PA submitted on CoverMyMeds Key [id number] Status is pending   Will continue to follow.   Audry Riles, PharmD, BCPS, BCCP, CPP Heart Failure Clinic Pharmacist [phone]

## 2020-09-11 NOTE — Telephone Encounter (Signed)
Advanced Heart Failure Patient Advocate Encounter  Prior Authorization for Sildenafil (60mg , tid) has been approved.    Effective dates: 09/11/20 through 05/23/21  Charlann Boxer, CPhT

## 2020-09-12 ENCOUNTER — Other Ambulatory Visit (HOSPITAL_COMMUNITY): Payer: Self-pay

## 2020-09-12 MED ORDER — POTASSIUM CHLORIDE ER 10 MEQ PO TBCR: 20.0000 meq | EXTENDED_RELEASE_TABLET | Freq: Every day | ORAL | 2 refills | Status: DC

## 2020-10-01 ENCOUNTER — Ambulatory Visit (HOSPITAL_COMMUNITY)
Admission: RE | Admit: 2020-10-01 | Discharge: 2020-10-01 | Disposition: A | Discharge status: Home / Self Care | Payer: Medicare Other | Source: Ambulatory Visit | Attending: Internal Medicine | Admitting: Internal Medicine

## 2020-10-01 ENCOUNTER — Other Ambulatory Visit: Payer: Self-pay

## 2020-10-01 DIAGNOSIS — J449 Chronic obstructive pulmonary disease, unspecified: Secondary | ICD-10-CM | POA: Diagnosis not present

## 2020-10-01 DIAGNOSIS — E119 Type 2 diabetes mellitus without complications: Secondary | ICD-10-CM | POA: Insufficient documentation

## 2020-10-01 DIAGNOSIS — I272 Pulmonary hypertension, unspecified: Secondary | ICD-10-CM | POA: Insufficient documentation

## 2020-10-01 DIAGNOSIS — E785 Hyperlipidemia, unspecified: Secondary | ICD-10-CM | POA: Insufficient documentation

## 2020-10-01 DIAGNOSIS — I700 Atherosclerosis of aorta: Secondary | ICD-10-CM | POA: Insufficient documentation

## 2020-10-01 DIAGNOSIS — I10 Essential (primary) hypertension: Secondary | ICD-10-CM | POA: Diagnosis not present

## 2020-10-01 LAB — ECHOCARDIOGRAM COMPLETE
Area-P 1/2: 3.12 cm2
Calc EF: 47.9 %
S' Lateral: 2.2 cm
Single Plane A2C EF: 47.8 %
Single Plane A4C EF: 50.7 %

## 2020-10-01 NOTE — Progress Notes (Signed)
  Echocardiogram 2D Echocardiogram has been performed.  Warren [name] 10/01/2020, 8:53 AM

## 2020-10-07 NOTE — Progress Notes (Signed)
Discharge Progress Report  Patient Details  Name: Warren [name] MRN: [id number] Date of Birth: [birth year] Referring Provider:   Flowsheet Row PULMONARY REHAB OTHER RESP ORIENTATION from 03/14/2020 in [place]  Referring Provider Dr. Haroldine Laws       Number of Visits: 33  Reason for Discharge:  Patient reached a stable level of exercise. Patient independent in their exercise. Patient has met program and personal goals.  Smoking History:  Social History   Tobacco Use  Smoking Status Former Smoker  . Quit date: 05/24/2002  . Years since quitting: 18.3  Smokeless Tobacco Never Used    Diagnosis:  Pulmonary hypertension, unspecified ([place])  ADL UCSD:  Pulmonary Assessment Scores    Row Name 07/17/20 1533         ADL UCSD   ADL Phase Exit     SOB Score total 47           CAT Score   CAT Score 12           mMRC Score   mMRC Score 2            Initial Exercise Prescription:   Discharge Exercise Prescription (Final Exercise Prescription Changes):  Exercise Prescription Changes - 07/15/20 1400      Response to Exercise   Blood Pressure (Admit) 110/62    Blood Pressure (Exercise) 126/66    Blood Pressure (Exit) 102/58    Heart Rate (Admit) 77 bpm    Heart Rate (Exercise) 105 bpm    Heart Rate (Exit) 93 bpm    Oxygen Saturation (Admit) 96 %    Oxygen Saturation (Exercise) 87 %    Oxygen Saturation (Exit) 95 %    Rating of Perceived Exertion (Exercise) 11    Perceived Dyspnea (Exercise) 11    Duration Continue with 30 min of aerobic exercise without signs/symptoms of physical distress.    Intensity THRR unchanged      Progression   Progression Continue to progress workloads to maintain intensity without signs/symptoms of physical distress.      Resistance Training   Training Prescription Yes    Weight 5 lbs    Reps 10-15    Time 10 Minutes      Oxygen   Oxygen Continuous    Liters 8      Treadmill   MPH 1.8    Grade 3     Minutes 17    METs 3.11      NuStep   Level 4    SPM 87    Minutes 22    METs 2.8           Functional Capacity:  6 Minute Walk    Row Name 07/17/20 1530         6 Minute Walk   Phase Discharge     Distance 1100 feet     Distance % Change 15.79 %     Distance Feet Change 150 ft     Walk Time 6 minutes     # of Rest Breaks 0     MPH 2.08     METS 2.69     RPE 12     Perceived Dyspnea  12     VO2 Peak 9.42     Symptoms Yes (comment)     Comments right hip pain 5/10     Resting HR 100 bpm     Resting BP 122/62     Resting Oxygen Saturation  94 %  Exercise Oxygen Saturation  during 6 min walk 85 %     Max Ex. HR 112 bpm     Max Ex. BP 122/70     2 Minute Post BP 112/66           Interval HR   1 Minute HR 109     2 Minute HR 110     3 Minute HR 110     4 Minute HR 112     5 Minute HR 110     6 Minute HR 112     2 Minute Post HR 90           Interval Oxygen   Interval Oxygen? Yes     Baseline Oxygen Saturation % 94 %     1 Minute Oxygen Saturation % 88 %     1 Minute Liters of Oxygen 8 L     2 Minute Oxygen Saturation % 86 %     2 Minute Liters of Oxygen 8 L     3 Minute Oxygen Saturation % 86 %     3 Minute Liters of Oxygen 8 L     4 Minute Oxygen Saturation % 86 %     4 Minute Liters of Oxygen 8 L     5 Minute Oxygen Saturation % 86 %     5 Minute Liters of Oxygen 8 L     6 Minute Oxygen Saturation % 85 %     6 Minute Liters of Oxygen 8 L     2 Minute Post Oxygen Saturation % 96 %     2 Minute Post Liters of Oxygen 8 L            Psychological, QOL, Others - Outcomes: PHQ 2/9: Depression screen [facility] 2/9 07/17/2020 03/14/2020  Decreased Interest 0 0  Down, Depressed, Hopeless 0 0  PHQ - 2 Score 0 0  Altered sleeping 0 0  Tired, decreased energy 1 1  Change in appetite 0 1  Feeling bad or failure about yourself  0 0  Trouble concentrating 0 0  Moving slowly or fidgety/restless 0 0  Suicidal thoughts 0 0  PHQ-9 Score 1 2  Difficult  doing work/chores Not difficult at all Not difficult at all    Quality of Life:  Quality of Life - 07/17/20 1612      Quality of Life   Select Quality of Life      Quality of Life Scores   Health/Function Pre 13.5 %    Health/Function Post 16 %    Health/Function % Change 18.52 %    Socioeconomic Pre 26.25 %    Socioeconomic Post 24.67 %    Socioeconomic % Change  -6.02 %    Psych/Spiritual Pre 14.64 %    Psych/Spiritual Post 18.36 %    Psych/Spiritual % Change 25.41 %    Family Pre 26.38 %    Family Post 21.2 %    Family % Change -19.64 %    GLOBAL Pre 17.18 %    GLOBAL Post 18.86 %    GLOBAL % Change 9.78 %           Personal Goals: Goals established at orientation with interventions provided to work toward goal.    Personal Goals Discharge:  Goals and Risk Factor Review    Row Name 05/05/20 0940 05/30/20 0931 06/20/20 1036 10/07/20 1005       Core Components/Risk Factors/Patient Goals Review   Personal Goals Review  Improve shortness of breath with ADL's Improve shortness of breath with ADL's Improve shortness of breath with ADL's Improve shortness of breath with ADL's    Review Patient has completed 12 sessions gaining 1 lb since last 30 day review. He was referred to pulmonary rehab with pulmonary HTN. He continues to do well in the program with progression and consistent attendance. He works very hard during sessions and has started using the treadmill for the past 4 sessions tolerating very well. His personal goals are to breathe better; be able to walk to his mailbox; and to get off of O2. He saw his pulmonologist 05/01/20 for a routine f/u visit. He reported to MD that he was feeling a lot stronger and was able to walk to his mail box and able to climb stairs. Will continue to monitor for progress. Patient has completed 20 sessions losing 1 lb since last 30 day review. He continues to do very well in the program with progression and conisistent attendance. He is doing  very well on the treadmill. He started taking a new medication for pulmonary HTN, Opsumit, approved 05/06/20. His personal goals are to breathe better; be able to walk to his mailbox; and get off of O2. We will continue to monitor his progress as he works toward meeting these goals. Patient has completed 24 sessions losing total of 3.74 lb since starting rehab. He continues to do very well in the program with progression and conisistent attendance. He is doing very well on the treadmill.  His personal goals are to breathe better; be able to walk to his mailbox; and get off of O2. We will continue to monitor his progress as he works toward meeting these goals. Patient completed 33 sessions before discharge. He progressed very well with exercise in the program. He was doing very well on the treadmill and was able to decrease his O2 a little bit. His personal goals were to breathe easier with ADLs and get off O2. He had reported being able to complete ADLs better. He is going to continue to strive for his goals outside of rehab.    Expected Outcomes Patient will complete the program meeting both personal and program goals. Patient will complete the program meeting both personal and program goals. Patient will complete the program meeting both personal and program goals. --           Exercise Goals and Review:  Exercise Goals    Row Name 05/06/20 1524 06/03/20 1600 07/01/20 1614         Exercise Goals   Increase Physical Activity Yes Yes Yes     Intervention Provide advice, education, support and counseling about physical activity/exercise needs.;Develop an individualized exercise prescription for aerobic and resistive training based on initial evaluation findings, risk stratification, comorbidities and participant's personal goals. Provide advice, education, support and counseling about physical activity/exercise needs.;Develop an individualized exercise prescription for aerobic and resistive training  based on initial evaluation findings, risk stratification, comorbidities and participant's personal goals. Provide advice, education, support and counseling about physical activity/exercise needs.;Develop an individualized exercise prescription for aerobic and resistive training based on initial evaluation findings, risk stratification, comorbidities and participant's personal goals.     Expected Outcomes Short Term: Attend rehab on a regular basis to increase amount of physical activity.;Long Term: Add in home exercise to make exercise part of routine and to increase amount of physical activity.;Long Term: Exercising regularly at least 3-5 days a week. Short Term: Attend rehab on a regular  basis to increase amount of physical activity.;Long Term: Add in home exercise to make exercise part of routine and to increase amount of physical activity.;Long Term: Exercising regularly at least 3-5 days a week. Short Term: Attend rehab on a regular basis to increase amount of physical activity.;Long Term: Add in home exercise to make exercise part of routine and to increase amount of physical activity.;Long Term: Exercising regularly at least 3-5 days a week.     Increase Strength and Stamina Yes Yes Yes     Intervention Provide advice, education, support and counseling about physical activity/exercise needs.;Develop an individualized exercise prescription for aerobic and resistive training based on initial evaluation findings, risk stratification, comorbidities and participant's personal goals. Provide advice, education, support and counseling about physical activity/exercise needs.;Develop an individualized exercise prescription for aerobic and resistive training based on initial evaluation findings, risk stratification, comorbidities and participant's personal goals. Provide advice, education, support and counseling about physical activity/exercise needs.;Develop an individualized exercise prescription for aerobic and  resistive training based on initial evaluation findings, risk stratification, comorbidities and participant's personal goals.     Expected Outcomes Short Term: Increase workloads from initial exercise prescription for resistance, speed, and METs.;Short Term: Perform resistance training exercises routinely during rehab and add in resistance training at home;Long Term: Improve cardiorespiratory fitness, muscular endurance and strength as measured by increased METs and functional capacity (6MWT) Short Term: Increase workloads from initial exercise prescription for resistance, speed, and METs.;Short Term: Perform resistance training exercises routinely during rehab and add in resistance training at home;Long Term: Improve cardiorespiratory fitness, muscular endurance and strength as measured by increased METs and functional capacity (6MWT) Short Term: Increase workloads from initial exercise prescription for resistance, speed, and METs.;Short Term: Perform resistance training exercises routinely during rehab and add in resistance training at home;Long Term: Improve cardiorespiratory fitness, muscular endurance and strength as measured by increased METs and functional capacity (6MWT)     Able to understand and use rate of perceived exertion (RPE) scale Yes Yes Yes     Intervention Provide education and explanation on how to use RPE scale Provide education and explanation on how to use RPE scale Provide education and explanation on how to use RPE scale     Expected Outcomes Short Term: Able to use RPE daily in rehab to express subjective intensity level;Long Term:  Able to use RPE to guide intensity level when exercising independently Short Term: Able to use RPE daily in rehab to express subjective intensity level;Long Term:  Able to use RPE to guide intensity level when exercising independently Short Term: Able to use RPE daily in rehab to express subjective intensity level;Long Term:  Able to use RPE to guide  intensity level when exercising independently     Able to understand and use Dyspnea scale Yes Yes Yes     Intervention Provide education and explanation on how to use Dyspnea scale Provide education and explanation on how to use Dyspnea scale Provide education and explanation on how to use Dyspnea scale     Expected Outcomes Short Term: Able to use Dyspnea scale daily in rehab to express subjective sense of shortness of breath during exertion;Long Term: Able to use Dyspnea scale to guide intensity level when exercising independently Short Term: Able to use Dyspnea scale daily in rehab to express subjective sense of shortness of breath during exertion;Long Term: Able to use Dyspnea scale to guide intensity level when exercising independently Short Term: Able to use Dyspnea scale daily in rehab to express  subjective sense of shortness of breath during exertion;Long Term: Able to use Dyspnea scale to guide intensity level when exercising independently     Knowledge and understanding of Target Heart Rate Range (THRR) Yes Yes Yes     Intervention Provide education and explanation of THRR including how the numbers were predicted and where they are located for reference Provide education and explanation of THRR including how the numbers were predicted and where they are located for reference Provide education and explanation of THRR including how the numbers were predicted and where they are located for reference     Expected Outcomes Short Term: Able to state/look up THRR;Short Term: Able to use daily as guideline for intensity in rehab;Long Term: Able to use THRR to govern intensity when exercising independently Short Term: Able to state/look up THRR;Short Term: Able to use daily as guideline for intensity in rehab;Long Term: Able to use THRR to govern intensity when exercising independently Short Term: Able to state/look up THRR;Short Term: Able to use daily as guideline for intensity in rehab;Long Term: Able to  use THRR to govern intensity when exercising independently     Understanding of Exercise Prescription Yes Yes Yes     Intervention Provide education, explanation, and written materials on patient's individual exercise prescription Provide education, explanation, and written materials on patient's individual exercise prescription Provide education, explanation, and written materials on patient's individual exercise prescription     Expected Outcomes Short Term: Able to explain program exercise prescription;Long Term: Able to explain home exercise prescription to exercise independently Short Term: Able to explain program exercise prescription;Long Term: Able to explain home exercise prescription to exercise independently Short Term: Able to explain program exercise prescription;Long Term: Able to explain home exercise prescription to exercise independently            Exercise Goals Re-Evaluation:  Exercise Goals Re-Evaluation    Row Name 05/06/20 1525 06/03/20 1600 07/01/20 1615 10/07/20 1009       Exercise Goal Re-Evaluation   Exercise Goals Review Increase Strength and Stamina;Increase Physical Activity;Able to understand and use rate of perceived exertion (RPE) scale;Able to understand and use Dyspnea scale;Knowledge and understanding of Target Heart Rate Range (THRR);Understanding of Exercise Prescription Increase Strength and Stamina;Increase Physical Activity;Able to understand and use rate of perceived exertion (RPE) scale;Able to understand and use Dyspnea scale;Knowledge and understanding of Target Heart Rate Range (THRR);Understanding of Exercise Prescription Increase Strength and Stamina;Increase Physical Activity;Able to understand and use rate of perceived exertion (RPE) scale;Able to understand and use Dyspnea scale;Knowledge and understanding of Target Heart Rate Range (THRR);Understanding of Exercise Prescription Increase Strength and Stamina;Increase Physical Activity;Able to understand  and use rate of perceived exertion (RPE) scale;Able to understand and use Dyspnea scale;Knowledge and understanding of Target Heart Rate Range (THRR);Understanding of Exercise Prescription    Comments Patient has completed 13 exercise sessions. He is tolerating exercise well and progressing well. He just started using the treadmill and has adapted well to that. He is getting stronger as well in regards to the strength training. He is currently exercising at 2.1 METs on the NuStep. Will continue to monitor and progress exercise as able. Pt has completed 21 exercise sessions. We have recently been increasing his oxygen liter flow to 10 while he is on the treadmill and have been able to maintain his SpO2 levels above 90%. He continues to progress and get stronger. He is currently exercising at 2.3 METs on the stepper. Will continue to monitor and progress as able.  Patient has completed 27 exercise sessions. We have recently decreased his liter flow back to 8L on the treadmill. He continues to progress well with exercise and is continuing to get stronger. He is currently exercising at 2.5 METs on the NuStep.Will continue to monitor and progress as able. Patient completed 33 sessions before discharge. He continued to progress well throughout the program. He was able to increase his intensities to walking on the treadmill. He worked very hard in the program and was always looking to improve. He is going to continue his exercise outside of rehab at home following and progressing the home exercise program we talked about. He ended rehab exercising at 3.11 METs on the treadmill.    Expected Outcomes Through exercise at rehab and a home exercise program, patient will achieve their goals. Through exercise at rehab and a home exercise program, patient will achieve their goals. Through exercise at rehab and a home exercise program, patient will achieve their goals. Through exercise at rehab and a home exercise program, patient  will achieve their goals.           Nutrition & Weight - Outcomes:  Pre Biometrics - 07/15/20 1443      Pre Biometrics   Weight 86.9 kg           Post Biometrics - 07/17/20 1532       Post  Biometrics   Height _0  (1.753 m)    Weight 86.8 kg    Waist Circumference 43 inches    Hip Circumference 41.5 inches    Waist to Hip Ratio 1.04 %    BMI (Calculated) 28.25    Triceps Skinfold 17 mm    % Body Fat 29.8 %    Grip Strength 42.6 kg    Flexibility 0 in    Single Leg Stand 60 seconds           Nutrition:  Nutrition Therapy & Goals - 06/20/20 1034      Personal Nutrition Goals   Comments We will continue to provide nutritional education through hand-outs.      Intervention Plan   Intervention Nutrition handout(s) given to patient.           Nutrition Discharge:  Nutrition Assessments - 07/17/20 1534      MEDFICTS Scores   Post Score 24           Education Questionnaire Score:  Knowledge Questionnaire Score - 07/17/20 1534      Knowledge Questionnaire Score   Post Score 14/18          Patient graduated from [facility] today on 07-17-20 after completing 33 sessions. They achieved LTG of 30 minutes of aerobic exercise at Max Met level of 3.11. All patients vitals are WNL. Discharge instruction has been reviewed in detail and patient stated an understanding of material given. Patient plans to exercise at home. Cardiac Rehab staff will make f/u call. Patient had no complaints of any abnormal S/S or pain on their exit visit.     Goals reviewed with patient; copy given to patient.

## 2020-10-07 NOTE — Addendum Note (Signed)
Encounter addended by: Lorin Glass on: 10/07/2020 10:16 AM  Actions taken: Flowsheet data copied forward, Flowsheet accepted, Clinical Note Signed, Episode resolved

## 2020-10-17 DIAGNOSIS — K219 Gastro-esophageal reflux disease without esophagitis: Secondary | ICD-10-CM | POA: Diagnosis not present

## 2020-10-17 DIAGNOSIS — I10 Essential (primary) hypertension: Secondary | ICD-10-CM | POA: Diagnosis not present

## 2020-11-20 DIAGNOSIS — I10 Essential (primary) hypertension: Secondary | ICD-10-CM | POA: Diagnosis not present

## 2020-11-20 DIAGNOSIS — K219 Gastro-esophageal reflux disease without esophagitis: Secondary | ICD-10-CM | POA: Diagnosis not present

## 2020-11-24 ENCOUNTER — Other Ambulatory Visit (HOSPITAL_COMMUNITY): Payer: Self-pay | Admitting: Internal Medicine

## 2020-12-25 ENCOUNTER — Ambulatory Visit: Payer: Medicare Other

## 2020-12-25 ENCOUNTER — Other Ambulatory Visit: Payer: Self-pay

## 2020-12-25 ENCOUNTER — Ambulatory Visit (INDEPENDENT_AMBULATORY_CARE_PROVIDER_SITE_OTHER): Payer: Medicare Other | Admitting: Orthopedic Surgery

## 2020-12-25 DIAGNOSIS — M7552 Bursitis of left shoulder: Secondary | ICD-10-CM | POA: Diagnosis not present

## 2020-12-25 DIAGNOSIS — I2510 Atherosclerotic heart disease of native coronary artery without angina pectoris: Secondary | ICD-10-CM | POA: Diagnosis not present

## 2020-12-25 DIAGNOSIS — M25512 Pain in left shoulder: Secondary | ICD-10-CM

## 2020-12-25 DIAGNOSIS — G8929 Other chronic pain: Secondary | ICD-10-CM

## 2020-12-25 NOTE — Progress Notes (Signed)
Chief Complaint  Patient presents with   New Patient (Initial Visit)   Shoulder Pain    Left/x 1 month/same thing happened to the right shoulder a few years ago   70 year old male with pulmonary artery hypertension on oxygen complains of left shoulder pain history of right shoulder pain treated with injection  No history of surgery or injury  Complaints of inability to wash his back and pain when he reaches up with decreased range of motion in that position  Examination shows tenderness over the proximal humerus not in the traps or shoulder or scapular region weakness in abduction normal strength in flexion positive impingement sign  X-rays show normal glenohumeral joint mild AC joint arthritis type I acromion  Encounter Diagnoses  Name Primary?   Acute pain of left shoulder Yes   Bursitis of left shoulder     Procedure note  Injection  Verbal consent was obtained to inject the left subacromial joint shoulder  Timeout procedure was completed to confirm injection site  Diagnosis  Encounter Diagnoses  Name Primary?   Acute pain of left shoulder Yes   Bursitis of left shoulder      Medications used Celestone 6 mg Lidocaine 1% plain 3 cc  Anesthesia was provided by ethyl chloride spray  Prep was performed with alcohol  Technique of injection posterior approach to the subacromial space  No complications were noted   Acute uncomplicated, x-rays in the office, injection

## 2020-12-25 NOTE — Patient Instructions (Signed)

## 2020-12-28 IMAGING — CT CT CHEST HIGH RESOLUTION W/O CM
2 of 7 series · 14 of 36 positions shown, 17 images · non-contrast
Comparison: None.

CLINICAL DATA: 68-year-old male with history of shortness of breath
worsening over the past 5 months.

EXAM:
CT CHEST WITHOUT CONTRAST
TECHNIQUE: Multidetector CT imaging of the chest was performed following the
standard protocol without intravenous contrast. High resolution
imaging of the lungs, as well as inspiratory and expiratory imaging,
was performed.

[Series 6: high resolution retro · axial · 0.70mm/px · z∈[+0,+245]mm · 11 of 295 slices shown, 14 images]
[im 25/295  mediastinal]
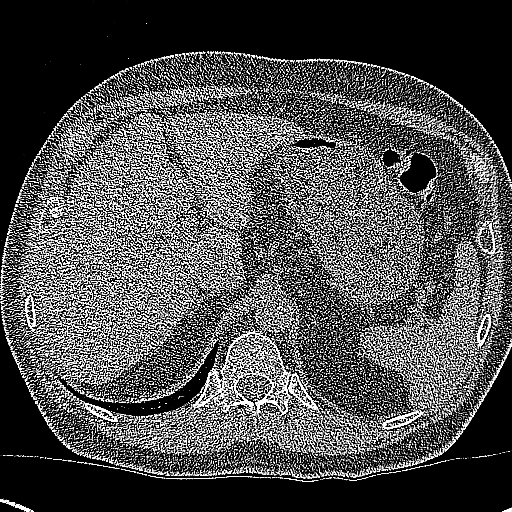
[im 25/295  lung]
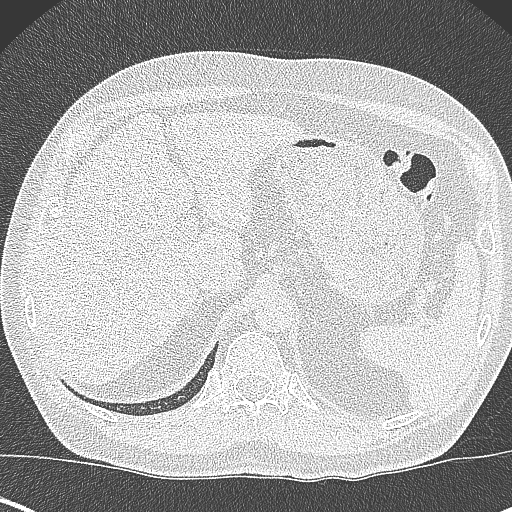
[im 50/295  lung]
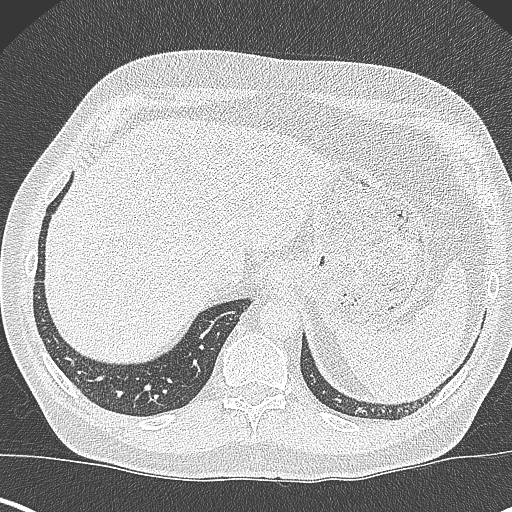
[im 74/295  lung]
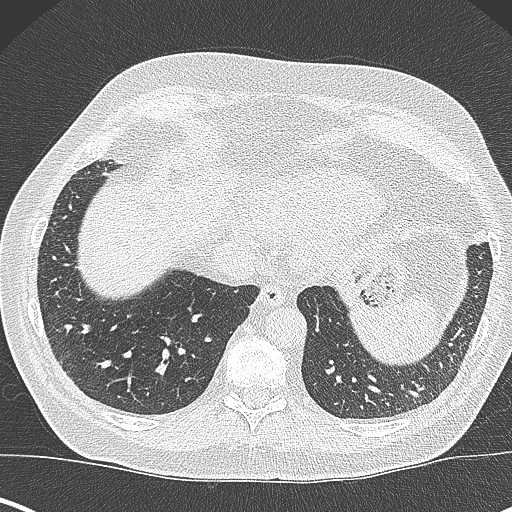
[im 99/295  lung]
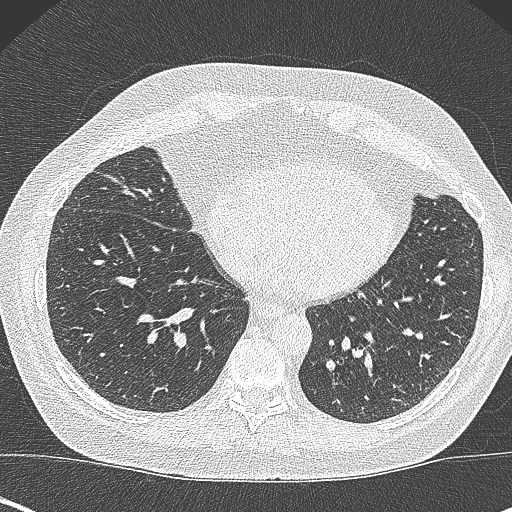
[im 123/295  mediastinal]
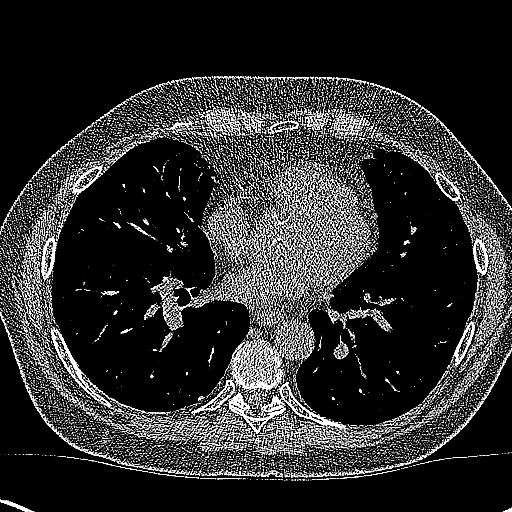
[im 123/295  lung]
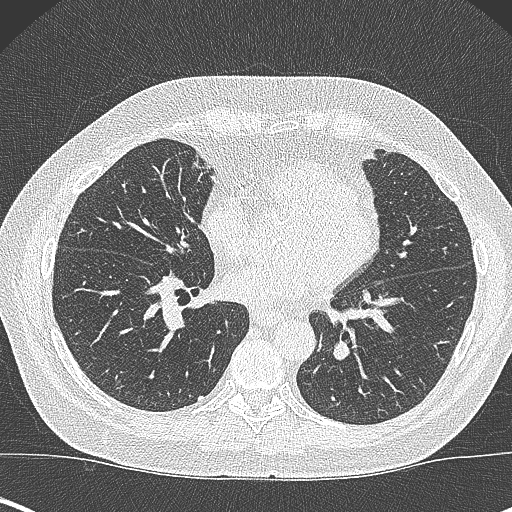
[im 148/295  lung]
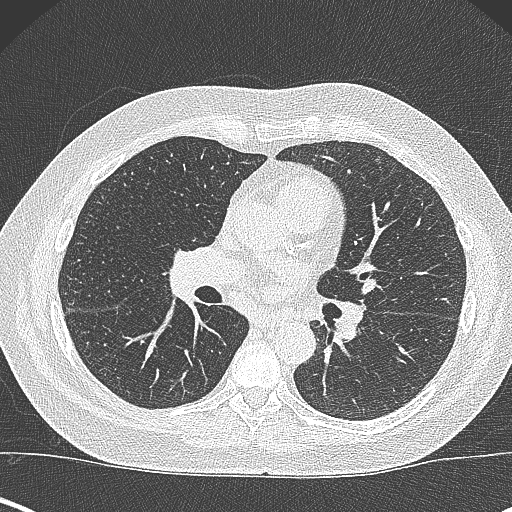
[im 172/295  lung]
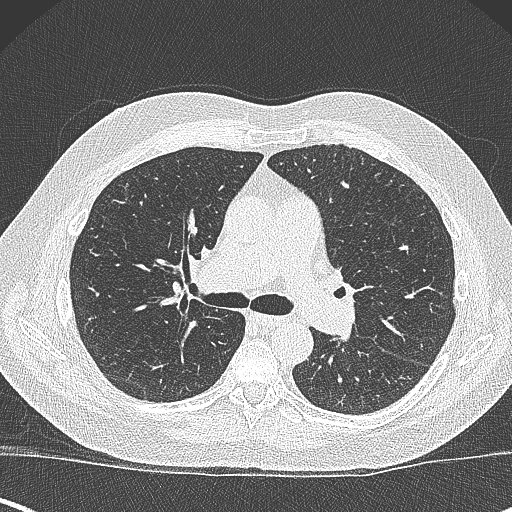
[im 197/295  lung]
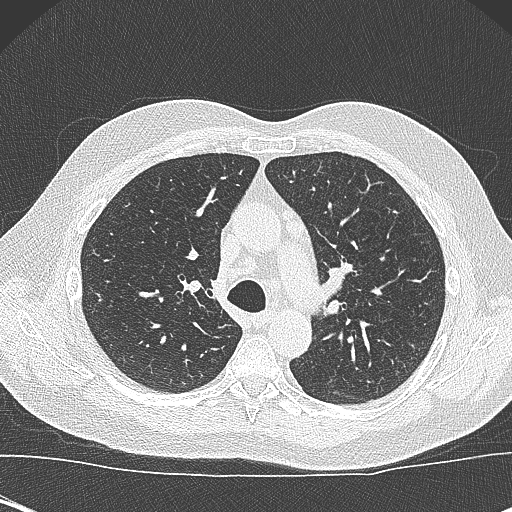
[im 221/295  mediastinal]
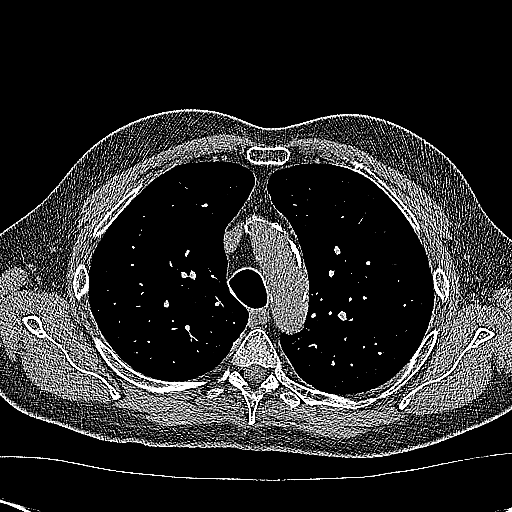
[im 221/295  lung]
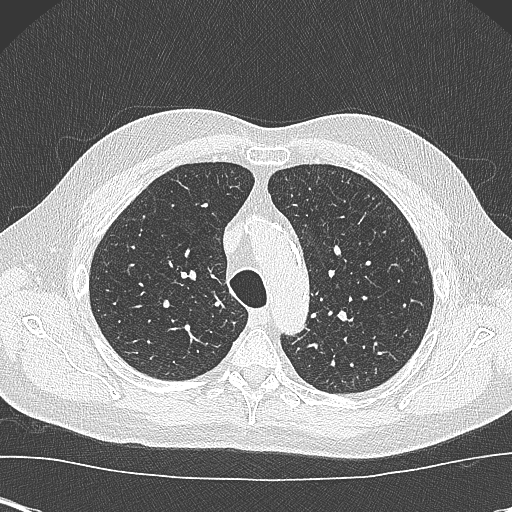
[im 246/295  lung]
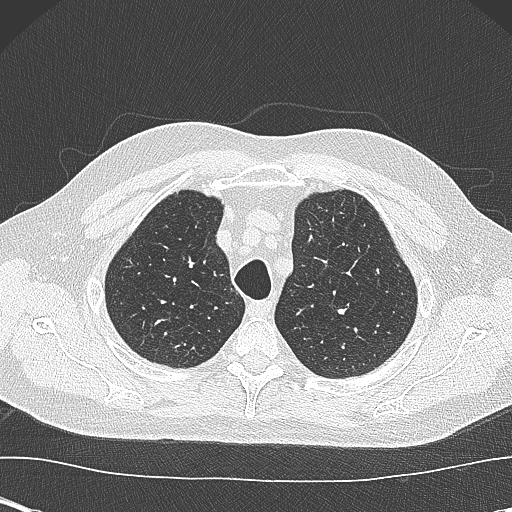
[im 270/295  lung]
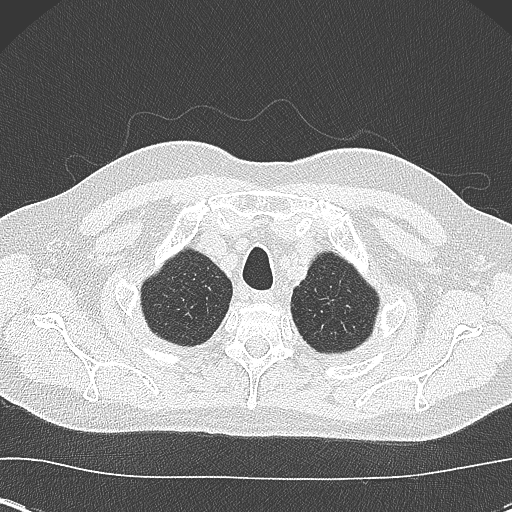

[Series 7: coronal · coronal · 0.63mm/px · 3 of 134 slices shown]
[im 27/134  lung]
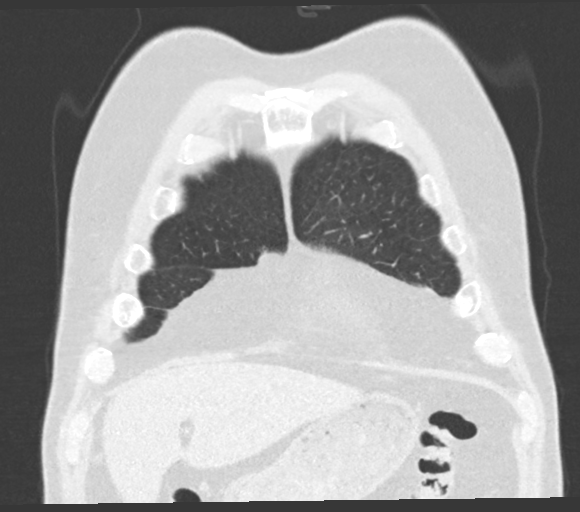
[im 54/134  lung]
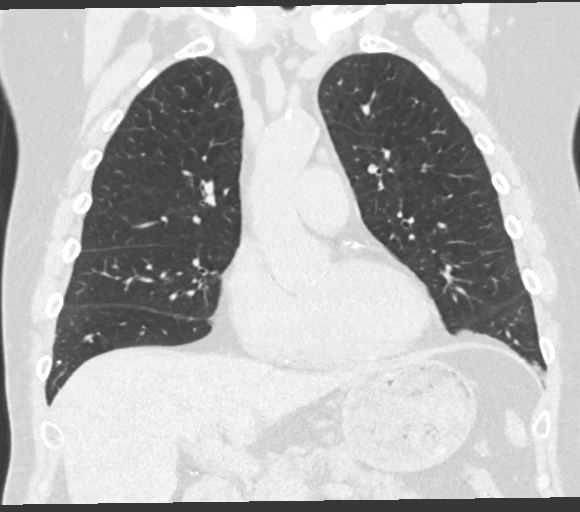
[im 80/134  lung]
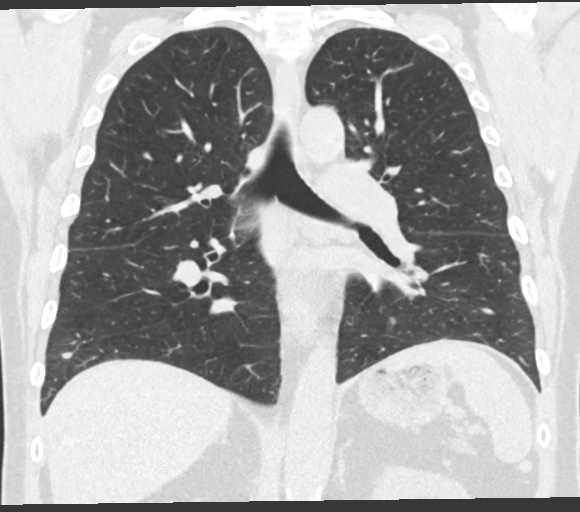

[14 of 36 positions shown; findings below may reference images not displayed]

FINDINGS: Cardiovascular: Heart size is normal. There is no significant
pericardial fluid, thickening or pericardial calcification. There is
aortic atherosclerosis, as well as atherosclerosis of the great
vessels of the mediastinum and the coronary arteries, including
calcified atherosclerotic plaque in the left main, left anterior
descending, left circumflex and right coronary arteries.

Mediastinum/Nodes: No pathologically enlarged mediastinal or hilar
lymph nodes. Please note that accurate exclusion of hilar adenopathy
is limited on noncontrast CT scans. Esophagus is unremarkable in
appearance. No axillary lymphadenopathy.

Lungs/Pleura: Mild linear scarring in the inferior segment of the
lingula and right middle lobe. High-resolution images otherwise
demonstrate no significant regions of ground-glass attenuation,
septal thickening, subpleural reticulation, traction bronchiectasis
or frank honeycombing. Inspiratory and expiratory imaging is
unremarkable. No acute consolidative airspace disease. No pleural
effusions. Multiple small pulmonary nodules scattered throughout the
lungs bilaterally, some of which are tiny calcified granulomas. The
largest noncalcified pulmonary nodule is in the right lower lobe
(axial image 110 of series 5) measuring 5 mm. No larger more
suspicious appearing pulmonary nodules or masses are noted.

Upper Abdomen: Aortic atherosclerosis.  Status post cholecystectomy.

Musculoskeletal: There are no aggressive appearing lytic or blastic
lesions noted in the visualized portions of the skeleton.
IMPRESSION: 1. No imaging findings to suggest interstitial lung disease.
2. Multiple small pulmonary nodules measuring 5 mm or less in size,
stable compared to prior examinations dating back to 2300,
considered definitively benign.
3. Aortic atherosclerosis, in addition to left main and 3 vessel
coronary artery disease. Please note that although the presence of
coronary artery calcium documents the presence of coronary artery
disease, the severity of this disease and any potential stenosis
cannot be assessed on this non-gated CT examination. Assessment for
potential risk factor modification, dietary therapy or pharmacologic
therapy may be warranted, if clinically indicated.

Aortic Atherosclerosis ([id number]-[id number].D).

## 2021-01-01 ENCOUNTER — Other Ambulatory Visit (HOSPITAL_COMMUNITY): Payer: Self-pay | Admitting: *Deleted

## 2021-01-01 ENCOUNTER — Ambulatory Visit (HOSPITAL_COMMUNITY)
Admission: RE | Admit: 2021-01-01 | Discharge: 2021-01-01 | Disposition: A | Discharge status: Home / Self Care | Payer: Medicare Other | Source: Ambulatory Visit | Attending: Internal Medicine | Admitting: Internal Medicine

## 2021-01-01 ENCOUNTER — Other Ambulatory Visit: Payer: Self-pay

## 2021-01-01 ENCOUNTER — Encounter (HOSPITAL_COMMUNITY): Payer: Self-pay | Admitting: Internal Medicine

## 2021-01-01 VITALS — BP 122/69 | HR 73 | Wt 194.0 lb

## 2021-01-01 DIAGNOSIS — I272 Pulmonary hypertension, unspecified: Secondary | ICD-10-CM

## 2021-01-01 DIAGNOSIS — J9611 Chronic respiratory failure with hypoxia: Secondary | ICD-10-CM | POA: Diagnosis not present

## 2021-01-01 DIAGNOSIS — Z8249 Family history of ischemic heart disease and other diseases of the circulatory system: Secondary | ICD-10-CM | POA: Insufficient documentation

## 2021-01-01 DIAGNOSIS — Z79899 Other long term (current) drug therapy: Secondary | ICD-10-CM | POA: Insufficient documentation

## 2021-01-01 DIAGNOSIS — I110 Hypertensive heart disease with heart failure: Secondary | ICD-10-CM | POA: Diagnosis not present

## 2021-01-01 DIAGNOSIS — Z880 Allergy status to penicillin: Secondary | ICD-10-CM | POA: Diagnosis not present

## 2021-01-01 DIAGNOSIS — Z7982 Long term (current) use of aspirin: Secondary | ICD-10-CM | POA: Insufficient documentation

## 2021-01-01 DIAGNOSIS — Z87891 Personal history of nicotine dependence: Secondary | ICD-10-CM | POA: Diagnosis not present

## 2021-01-01 DIAGNOSIS — I2510 Atherosclerotic heart disease of native coronary artery without angina pectoris: Secondary | ICD-10-CM | POA: Insufficient documentation

## 2021-01-01 DIAGNOSIS — J9610 Chronic respiratory failure, unspecified whether with hypoxia or hypercapnia: Secondary | ICD-10-CM | POA: Insufficient documentation

## 2021-01-01 DIAGNOSIS — G4733 Obstructive sleep apnea (adult) (pediatric): Secondary | ICD-10-CM | POA: Diagnosis not present

## 2021-01-01 MED ORDER — POTASSIUM CHLORIDE ER 10 MEQ PO TBCR: 20.0000 meq | EXTENDED_RELEASE_TABLET | Freq: Every day | ORAL | 3 refills | Status: DC

## 2021-01-01 MED ORDER — OPSUMIT 10 MG PO TABS
10.0000 mg | ORAL_TABLET | Freq: Every day | ORAL | 11 refills | Status: DC
Start: 2021-01-01 — End: 2021-12-07

## 2021-01-01 NOTE — Progress Notes (Signed)
Advanced Heart Failure Clinic Note   Date:  01/01/2021   ID:  Warren [name], DOB [birth year], MRN [id number]  Location: Home  Provider location: [facility] Type of Visit: Established patient  PCP:  [name], MD  Cardiologist:  [name], MD Primary HF: [place] Gelin  Chief Complaint: Heart Failure follow-up   History of Present Illness:   Warren [name] is a 70 y.o. male with a hx of HTN, HLD, pulmonary hypertension referred by Dr. [name] for further management of [place].    He has had a very complete w/u by Dr. [name]. He has had rather rapid onset of SOB associated with severe diffusion defect on PFTs with normal spirometry. CT negative for ILD. Echo showed normal EF with moderate to severe pHTN. VQ scan was negative.  His basic rheumatologic work-up was negative.     Underwent R/L cath 12/14/19 as below and started on Opsumit. At last visit sildenafil increased to 40 tid.   He is here for routine f/u. On Macitentan 10 daily and sildenafil 60 tid. 6MW much improved in 4/22 (435m. Here with his wife. Feels pretty good. Says walking is not a problem but gets SOB easily with stairs or if it is very hot. Can go up steps but has to go slow. No dizzinees, edema, orthopnea or PND. Wearing home O2 @ 2L (3L with exertion). No syncope or presyncope. Using weight bands but not doing a lot of exercise.     Echo 5/22 EF 50-55% RV normal Echo 05/01/20 EF 50-55% RV mildly dilated mild HK RVSP ~470mG  PAH therapy 1. Macitentan '10mg'$   Started 8/21 2. Sildenafil 60 tid Started 10/21  - 6MW 01/02/20 25967mth sats down to 79% - 6MW 03/14/20 290m36mopped for 1 min due to drop in O2) - 6MW 09/01/20 457m 18mat ranged 80%-93%  on 3-8L oxygen     R/L cath 12/14/19   EF 55-60%   Prox RCA lesion is 20% stenosed. Mid LAD lesion is 40% stenosed.   Findings:   Ao = 120/71 (95) LV = 117/8 RA = 5 RV = 67/6 PA = 68/28 (44) PCW = 6 Fick cardiac output/index =  4.9/2.4 PVR = 7.8 WU FA sat = 92% PA sat = 69%, 70%   Past Medical History:  Diagnosis Date   CHF (congestive heart failure) (HCC) [place]    Hyperlipidemia    Hypertension    Past Surgical History:  Procedure Laterality Date   CARDIAC CATHETERIZATION     RIGHT/LEFT HEART CATH AND CORONARY ANGIOGRAPHY N/A 12/14/2019   Procedure: RIGHT/LEFT HEART CATH AND CORONARY ANGIOGRAPHY;  Surgeon: BensiJolaine Artist  [facility];  Service: Cardiovascular;  Laterality: N/A;     Current Outpatient Medications  Medication Sig Dispense Refill   acetaminophen (TYLENOL) 500 MG tablet Take 1,000 mg by mouth every 6 (six) hours as needed for moderate pain or headache.     aspirin EC 81 MG tablet Take 1 tablet (81 mg total) by mouth daily. 90 tablet 3   ezetimibe (ZETIA) 10 MG tablet Take 10 mg by mouth daily.     hydrochlorothiazide (HYDRODIURIL) 25 MG tablet Take 25 mg by mouth daily.      OPSUMIT 10 MG tablet Take 1 tablet daily. 30 tablet 4   OXYGEN Inhale 2-3 L into the lungs continuous. 2 L at rest and with activity 3 L  pantoprazole (PROTONIX) 40 MG tablet Take 40 mg by mouth daily.     potassium chloride (KLOR-CON) 10 MEQ tablet Take 2 tablets (20 mEq total) by mouth daily. 120 tablet 2   rosuvastatin (CRESTOR) 5 MG tablet Take 1 tablet (5 mg total) by mouth daily. 30 tablet 5   sildenafil (REVATIO) 20 MG tablet Take 3 tablets (60 mg total) by mouth 3 (three) times daily. 270 tablet 11   No current facility-administered medications for this encounter.   Facility-Administered Medications Ordered in Other Encounters  Medication Dose Route Frequency Provider Last Rate Last Admin   sodium chloride flush (NS) 0.9 % injection 10 mL  10 mL Intravenous PRN [name], Cassie Freer, MD   20 mL at 12/05/19 1125    Allergies:   Amlodipine besylate, Penicillins, and Valsartan   Social History:  The patient  reports that he quit smoking about 18 years ago.  His smoking use included cigarettes. He has never used smokeless tobacco. He reports that he does not currently use alcohol. He reports that he does not use drugs.   Family History:  The patient's family history includes Heart disease in his mother.   ROS:  Please see the history of present illness.   All other systems are personally reviewed and negative.   Vitals:   01/01/21 1004  BP: 122/69  Pulse: 73  SpO2: 94%  Weight: 88 kg (194 lb)    Exam:  General:  Well appearing. No resp difficulty on O2 HEENT: normal Neck: supple. no JVD. Carotids 2+ bilat; no bruits. No lymphadenopathy or thryomegaly appreciated. Cor: PMI nondisplaced. Regular rate & rhythm. No rubs, gallops or murmurs. Lungs: clear Abdomen: soft, nontender, nondistended. No hepatosplenomegaly. No bruits or masses. Good bowel sounds. Extremities: no cyanosis, clubbing, rash, edema Neuro: alert & orientedx3, cranial nerves grossly intact. moves all 4 extremities w/o difficulty. Affect pleasant    Recent Labs: 09/01/2020: B Natriuretic Peptide 18.4; BUN 18; Creatinine, Ser 1.14; Hemoglobin 15.9; Platelets 206; Potassium 4.1; Sodium 139  Personally reviewed   Wt Readings from Last 3 Encounters:  01/01/21 88 kg (194 lb)  09/01/20 86.9 kg (191 lb 9.6 oz)  07/17/20 86.8 kg (191 lb 5.8 oz)    ECG NSR 75 No ST-T wave abnormalities. Personally reviewed   ASSESSMENT AND PLAN:  1. Moderate to severe pulmonary HTN - PFT 08/31/2019: no obstruction/restriction, severe diffusion defect (DCLO 25%) - Echo 9/20 LVEF normal. RV mild to moderate HK  - Echo 05/01/20 EF 50-55% RV mildly to moderately HK  - Echo 5/22 EF 50-55% RV normal - CT negative for ILD. ANA negative, anti-CCP negative, anti-RA negative - VQ scan negative CTEPH - Sleep study with mild PAH. Follows with Dr. Radford Pax.   - WHO Group I - likely IPAH - Improved NYHA II on combination therapy - macitentan and sildenafil 60 tid - 6MW 01/02/20 236mwith sats down to  79% - 6MW 03/14/20 2935mstopped for 1 min due to drop in O2) - 6MW 09/01/20 45734m Sat ranged 80%-93%  on 3-8L oxygen - NYHA II, no RV strain on echo and Reveal Lite score is low risk so will proceed with repeat RHC to reassess need for triple therapy with selexipeg  2. Chronic respiratory failure due to [facility]continue home O2. Follow sats with pulse ok and titrate to keep sats >= 90%  - no change   3.  CAD -Three-vessel calcification seen on recent lung CT - Minimal CAD by cath  -  No s/s ischemia - Followed by Dr. [name]. Has been intolerant of statins.   4. OSA - Mild OSA 1/22: AHI 5.1/hr. Sats down to 77% - Followed by Dr. Radford Pax   Signed, Glori Bickers, MD  01/01/2021 10:07 AM  [place] [address] [phone] (office) [phone] (fax)

## 2021-01-01 NOTE — Patient Instructions (Signed)
Refill for Opsumit sent to [facility]  Refill for Pot[facility] to [facility]  Heart Catheterization, see instructions below  Please call our office in January 2023 to schedule your follow up appointment  If you have any questions or concerns before your next appointment please send [place] a message through [place] or call our office at [phone].    TO LEAVE A MESSAGE FOR THE NURSE SELECT OPTION 2, PLEASE LEAVE A MESSAGE INCLUDING: YOUR NAME DATE OF BIRTH CALL BACK NUMBER REASON FOR CALL**this is important as we prioritize the call backs  YOU WILL RECEIVE A CALL BACK THE SAME DAY AS LONG AS YOU CALL BEFORE 4:00 PM  milAt the Advanced Heart Failure Clinic, you and your health needs are our priority. As part of our continuing mission to provide you with exceptional heart care, we have created designated Provider Care Teams. These Care Teams include your primary Cardiologist (physician) and Advanced Practice Providers (APPs- Physician Assistants and Nurse Practitioners) who all work together to provide you with the care you need, when you need it.   You may see any of the following providers on your designated Care Team at your next follow up: Dr Glori Bickers Dr Loralie Champagne Dr Patrice Paradise, NP Lyda Jester, [place] Ginnie Smart Audry Riles, PharmD   Please be sure to bring in all your medications bottles to every appointment.    CATHETERIZATION INSTRUCTIONS:  You are scheduled for a Cardiac Catheterization on Thursday, September 1 with Dr. Glori Bickers.  1. Please arrive at the [facility] (Main Entrance A) at [facility]: [address] [place], [place] [address] at 8:30 AM (This time is two hours before your procedure to ensure your preparation). Free valet parking service is available.   Special note: Every effort is made to have your procedure done on time. Please understand that emergencies sometimes delay scheduled procedures.  2. Diet:  Do not eat solid foods after midnight.  The patient may have clear liquids until 5am upon the day of the procedure.  3. Labs: WILL BE DONE SEPT 9/1 AT HOSPITAL  4. Medication instructions in preparation for your procedure:   Contrast Allergy: No   THUR 9/1 AM DO NOT TAKE HCTZ  On the morning of your procedure, take your Aspirin and any morning medicines NOT listed above.  You may use sips of water.  5. Plan for one night stay--bring personal belongings. 6. Bring a current list of your medications and current insurance cards. 7. You MUST have a responsible person to drive you home. 8. Someone MUST be with you the first 24 hours after you arrive home or your discharge will be delayed. 9. Please wear clothes that are easy to get on and off and wear slip-on shoes.  Thank you for allowing [place] to care for you!   -- [facility]

## 2021-01-01 NOTE — H&P (View-Only) (Signed)
Advanced Heart Failure Clinic Note   Date:  01/01/2021   ID:  Warren [name], DOB 1951-04-11, MRN [id number]  Location: Home  Provider location: [facility] Type of Visit: Established patient  PCP:  [name], MD  Cardiologist:  [name], MD Primary HF: [place]as[place] Nahm  Chief Complaint: Heart Failure follow-up   History of Present Illness:   Warren [name] is a 70 y.o. male with a hx of HTN, HLD, pulmonary hypertension referred by Dr. [name] for further management of [name].    He has had a very complete w/u by Dr. [name]. He has had rather rapid onset of SOB associated with severe diffusion defect on PFTs with normal spirometry. CT negative for ILD. Echo showed normal EF with moderate to severe pHTN. VQ scan was negative.  His basic rheumatologic work-up was negative.     Underwent R/L cath 12/14/19 as below and started on Opsumit. At last visit sildenafil increased to 40 tid.   He is here for routine f/u. On Macitentan 10 daily and sildenafil 60 tid. 6MW much improved in 4/22 (437m. Here with his wife. Feels pretty good. Says walking is not a problem but gets SOB easily with stairs or if it is very hot. Can go up steps but has to go slow. No dizzinees, edema, orthopnea or PND. Wearing home O2 @ 2L (3L with exertion). No syncope or presyncope. Using weight bands but not doing a lot of exercise.     Echo 5/22 EF 50-55% RV normal Echo 05/01/20 EF 50-55% RV mildly dilated mild HK RVSP ~465mG  PAH therapy 1. Macitentan '10mg'$   Started 8/21 2. Sildenafil 60 tid Started 10/21  - 6MW 01/02/20 25962mth sats down to 79% - 6MW 03/14/20 290m6mopped for 1 min due to drop in O2) - 6MW 09/01/20 457m 84mat ranged 80%-93%  on 3-8L oxygen     R/L cath 12/14/19   EF 55-60%   Prox RCA lesion is 20% stenosed. Mid LAD lesion is 40% stenosed.   Findings:   Ao = 120/71 (95) LV = 117/8 RA = 5 RV = 67/6 PA = 68/28 (44) PCW = 6 Fick cardiac output/index =  4.9/2.4 PVR = 7.8 WU FA sat = 92% PA sat = 69%, 70%   Past Medical History:  Diagnosis Date   CHF (congestive heart failure) (HCC) [place] disease    Hyperlipidemia    Hypertension    Past Surgical History:  Procedure Laterality Date   CARDIAC CATHETERIZATION     RIGHT/LEFT HEART CATH AND CORONARY ANGIOGRAPHY N/A 12/14/2019   Procedure: RIGHT/LEFT HEART CATH AND CORONARY ANGIOGRAPHY;  Surgeon: BensiJolaine Artist  [facility];  Service: Cardiovascular;  Laterality: N/A;     Current Outpatient Medications  Medication Sig Dispense Refill   acetaminophen (TYLENOL) 500 MG tablet Take 1,000 mg by mouth every 6 (six) hours as needed for moderate pain or headache.     aspirin EC 81 MG tablet Take 1 tablet (81 mg total) by mouth daily. 90 tablet 3   ezetimibe (ZETIA) 10 MG tablet Take 10 mg by mouth daily.     hydrochlorothiazide (HYDRODIURIL) 25 MG tablet Take 25 mg by mouth daily.      OPSUMIT 10 MG tablet Take 1 tablet daily. 30 tablet 4   OXYGEN Inhale 2-3 L into the lungs continuous. 2 L at rest and with activity 3 L  pantoprazole (PROTONIX) 40 MG tablet Take 40 mg by mouth daily.     potassium chloride (KLOR-CON) 10 MEQ tablet Take 2 tablets (20 mEq total) by mouth daily. 120 tablet 2   rosuvastatin (CRESTOR) 5 MG tablet Take 1 tablet (5 mg total) by mouth daily. 30 tablet 5   sildenafil (REVATIO) 20 MG tablet Take 3 tablets (60 mg total) by mouth 3 (three) times daily. 270 tablet 11   No current facility-administered medications for this encounter.   Facility-Administered Medications Ordered in Other Encounters  Medication Dose Route Frequency Provider Last Rate Last Admin   sodium chloride flush (NS) 0.9 % injection 10 mL  10 mL Intravenous PRN [name], Cassie Freer, MD   20 mL at 12/05/19 1125    Allergies:   Amlodipine besylate, Penicillins, and Valsartan   Social History:  The patient  reports that he quit smoking about 18 years ago.  His smoking use included cigarettes. He has never used smokeless tobacco. He reports that he does not currently use alcohol. He reports that he does not use drugs.   Family History:  The patient's family history includes Heart disease in his mother.   ROS:  Please see the history of present illness.   All other systems are personally reviewed and negative.   Vitals:   01/01/21 1004  BP: 122/69  Pulse: 73  SpO2: 94%  Weight: 88 kg (194 lb)    Exam:  General:  Well appearing. No resp difficulty on O2 HEENT: normal Neck: supple. no JVD. Carotids 2+ bilat; no bruits. No lymphadenopathy or thryomegaly appreciated. Cor: PMI nondisplaced. Regular rate & rhythm. No rubs, gallops or murmurs. Lungs: clear Abdomen: soft, nontender, nondistended. No hepatosplenomegaly. No bruits or masses. Good bowel sounds. Extremities: no cyanosis, clubbing, rash, edema Neuro: alert & orientedx3, cranial nerves grossly intact. moves all 4 extremities w/o difficulty. Affect pleasant    Recent Labs: 09/01/2020: B Natriuretic Peptide 18.4; BUN 18; Creatinine, Ser 1.14; Hemoglobin 15.9; Platelets 206; Potassium 4.1; Sodium 139  Personally reviewed   Wt Readings from Last 3 Encounters:  01/01/21 88 kg (194 lb)  09/01/20 86.9 kg (191 lb 9.6 oz)  07/17/20 86.8 kg (191 lb 5.8 oz)    ECG NSR 75 No ST-T wave abnormalities. Personally reviewed   ASSESSMENT AND PLAN:  1. Moderate to severe pulmonary HTN - PFT 08/31/2019: no obstruction/restriction, severe diffusion defect (DCLO 25%) - Echo 9/20 LVEF normal. RV mild to moderate HK  - Echo 05/01/20 EF 50-55% RV mildly to moderately HK  - Echo 5/22 EF 50-55% RV normal - CT negative for ILD. ANA negative, anti-CCP negative, anti-RA negative - VQ scan negative CTEPH - Sleep study with mild PAH. Follows with Dr. Radford Pax.   - WHO Group I - likely IPAH - Improved NYHA II on combination therapy - macitentan and sildenafil 60 tid - 6MW 01/02/20 26mwith sats down to  79% - 6MW 03/14/20 2931mstopped for 1 min due to drop in O2) - 6MW 09/01/20 45794m Sat ranged 80%-93%  on 3-8L oxygen - NYHA II, no RV strain on echo and Reveal Lite score is low risk so will proceed with repeat RHC to reassess need for triple therapy with selexipeg  2. Chronic respiratory failure due to [facility]HMacon [facility]continue home O2. Follow sats with pulse ok and titrate to keep sats >= 90%  - no change   3.  CAD -Three-vessel calcification seen on recent lung CT - Minimal CAD by cath  -  No s/s ischemia - Followed by Dr. [name]. Has been intolerant of statins.   4. OSA - Mild OSA 1/22: AHI 5.1/hr. Sats down to 77% - Followed by Dr. Radford Pax   Signed, Glori Bickers, MD  01/01/2021 10:07 AM  [place] [address] [phone] (office) [phone] (fax)

## 2021-01-01 NOTE — Addendum Note (Signed)
Encounter addended by: Scarlette Calico, RN on: 01/01/2021 11:06 AM  Actions taken: Order list changed, Pharmacy for encounter modified, Clinical Note Signed

## 2021-01-01 NOTE — Addendum Note (Signed)
Addended by: Scarlette Calico on: 01/01/2021 11:44 AM   Modules accepted: Orders

## 2021-01-09 DIAGNOSIS — H2513 Age-related nuclear cataract, bilateral: Secondary | ICD-10-CM | POA: Diagnosis not present

## 2021-01-09 DIAGNOSIS — H5203 Hypermetropia, bilateral: Secondary | ICD-10-CM | POA: Diagnosis not present

## 2021-01-14 DIAGNOSIS — I10 Essential (primary) hypertension: Secondary | ICD-10-CM | POA: Diagnosis not present

## 2021-01-14 DIAGNOSIS — R7301 Impaired fasting glucose: Secondary | ICD-10-CM | POA: Diagnosis not present

## 2021-01-19 DIAGNOSIS — R0902 Hypoxemia: Secondary | ICD-10-CM | POA: Diagnosis not present

## 2021-01-19 DIAGNOSIS — I10 Essential (primary) hypertension: Secondary | ICD-10-CM | POA: Diagnosis not present

## 2021-01-19 DIAGNOSIS — J449 Chronic obstructive pulmonary disease, unspecified: Secondary | ICD-10-CM | POA: Diagnosis not present

## 2021-01-19 DIAGNOSIS — R809 Proteinuria, unspecified: Secondary | ICD-10-CM | POA: Diagnosis not present

## 2021-01-19 DIAGNOSIS — K219 Gastro-esophageal reflux disease without esophagitis: Secondary | ICD-10-CM | POA: Diagnosis not present

## 2021-01-19 DIAGNOSIS — E782 Mixed hyperlipidemia: Secondary | ICD-10-CM | POA: Diagnosis not present

## 2021-01-19 DIAGNOSIS — I270 Primary pulmonary hypertension: Secondary | ICD-10-CM | POA: Diagnosis not present

## 2021-01-19 DIAGNOSIS — Z0001 Encounter for general adult medical examination with abnormal findings: Secondary | ICD-10-CM | POA: Diagnosis not present

## 2021-01-19 DIAGNOSIS — Z125 Encounter for screening for malignant neoplasm of prostate: Secondary | ICD-10-CM | POA: Diagnosis not present

## 2021-01-19 DIAGNOSIS — R7303 Prediabetes: Secondary | ICD-10-CM | POA: Diagnosis not present

## 2021-01-19 DIAGNOSIS — J9611 Chronic respiratory failure with hypoxia: Secondary | ICD-10-CM | POA: Diagnosis not present

## 2021-01-19 DIAGNOSIS — G4730 Sleep apnea, unspecified: Secondary | ICD-10-CM | POA: Diagnosis not present

## 2021-01-22 ENCOUNTER — Telehealth (HOSPITAL_COMMUNITY): Payer: Self-pay | Admitting: Pharmacy Technician

## 2021-01-22 ENCOUNTER — Encounter (HOSPITAL_COMMUNITY)
Admission: RE | Disposition: A | Discharge status: Home / Self Care | Payer: Self-pay | Source: Home / Self Care | Attending: Internal Medicine

## 2021-01-22 ENCOUNTER — Other Ambulatory Visit (HOSPITAL_COMMUNITY): Payer: Self-pay

## 2021-01-22 ENCOUNTER — Encounter (HOSPITAL_COMMUNITY): Payer: Self-pay | Admitting: Internal Medicine

## 2021-01-22 ENCOUNTER — Ambulatory Visit (HOSPITAL_COMMUNITY)
Admission: RE | Admit: 2021-01-22 | Discharge: 2021-01-22 | Disposition: A | Discharge status: Home / Self Care | Payer: Medicare Other | Attending: Internal Medicine | Admitting: Internal Medicine

## 2021-01-22 ENCOUNTER — Other Ambulatory Visit: Payer: Self-pay

## 2021-01-22 DIAGNOSIS — J9610 Chronic respiratory failure, unspecified whether with hypoxia or hypercapnia: Secondary | ICD-10-CM | POA: Insufficient documentation

## 2021-01-22 DIAGNOSIS — Z888 Allergy status to other drugs, medicaments and biological substances status: Secondary | ICD-10-CM | POA: Insufficient documentation

## 2021-01-22 DIAGNOSIS — I110 Hypertensive heart disease with heart failure: Secondary | ICD-10-CM | POA: Insufficient documentation

## 2021-01-22 DIAGNOSIS — Z880 Allergy status to penicillin: Secondary | ICD-10-CM | POA: Insufficient documentation

## 2021-01-22 DIAGNOSIS — Z87891 Personal history of nicotine dependence: Secondary | ICD-10-CM | POA: Insufficient documentation

## 2021-01-22 DIAGNOSIS — E785 Hyperlipidemia, unspecified: Secondary | ICD-10-CM | POA: Diagnosis not present

## 2021-01-22 DIAGNOSIS — I2510 Atherosclerotic heart disease of native coronary artery without angina pectoris: Secondary | ICD-10-CM | POA: Diagnosis not present

## 2021-01-22 DIAGNOSIS — G4733 Obstructive sleep apnea (adult) (pediatric): Secondary | ICD-10-CM | POA: Insufficient documentation

## 2021-01-22 DIAGNOSIS — I2721 Secondary pulmonary arterial hypertension: Secondary | ICD-10-CM | POA: Diagnosis not present

## 2021-01-22 DIAGNOSIS — Z8249 Family history of ischemic heart disease and other diseases of the circulatory system: Secondary | ICD-10-CM | POA: Insufficient documentation

## 2021-01-22 DIAGNOSIS — I272 Pulmonary hypertension, unspecified: Secondary | ICD-10-CM

## 2021-01-22 DIAGNOSIS — Z79899 Other long term (current) drug therapy: Secondary | ICD-10-CM | POA: Diagnosis not present

## 2021-01-22 DIAGNOSIS — I509 Heart failure, unspecified: Secondary | ICD-10-CM | POA: Diagnosis not present

## 2021-01-22 DIAGNOSIS — Z7982 Long term (current) use of aspirin: Secondary | ICD-10-CM | POA: Diagnosis not present

## 2021-01-22 HISTORY — PX: RIGHT HEART CATH: CATH118263

## 2021-01-22 LAB — POCT I-STAT EG7
Acid-Base Excess: 6 mmol/L — ABNORMAL HIGH (ref 0.0–2.0)
Acid-Base Excess: 3 mmol/L — ABNORMAL HIGH (ref 0.0–2.0)
Bicarbonate: 30 mmol/L — ABNORMAL HIGH (ref 20.0–28.0)
Bicarbonate: 26.6 mmol/L (ref 20.0–28.0)
Calcium, Ion: 1.19 mmol/L (ref 1.15–1.40)
Calcium, Ion: 1.19 mmol/L (ref 1.15–1.40)
HCT: 43 % (ref 39.0–52.0)
HCT: 43 % (ref 39.0–52.0)
Hemoglobin: 14.6 g/dL (ref 13.0–17.0)
Hemoglobin: 14.6 g/dL (ref 13.0–17.0)
O2 Saturation: 75 %
O2 Saturation: 75 %
Potassium: 3.8 mmol/L (ref 3.5–5.1)
Potassium: 3.7 mmol/L (ref 3.5–5.1)
Sodium: 139 mmol/L (ref 135–145)
Sodium: 139 mmol/L (ref 135–145)
TCO2: 31 mmol/L (ref 22–32)
TCO2: 28 mmol/L (ref 22–32)
pCO2, Ven: 40 mmHg — ABNORMAL LOW (ref 44.0–60.0)
pCO2, Ven: 35.9 mmHg — ABNORMAL LOW (ref 44.0–60.0)
pH, Ven: 7.484 — ABNORMAL HIGH (ref 7.250–7.430)
pH, Ven: 7.478 — ABNORMAL HIGH (ref 7.250–7.430)
pO2, Ven: 37 mmHg (ref 32.0–45.0)
pO2, Ven: 37 mmHg (ref 32.0–45.0)

## 2021-01-22 LAB — BASIC METABOLIC PANEL
Anion gap: 11 (ref 5–15)
BUN: 14 mg/dL (ref 8–23)
CO2: 25 mmol/L (ref 22–32)
Calcium: 9.3 mg/dL (ref 8.9–10.3)
Chloride: 101 mmol/L (ref 98–111)
Creatinine, Ser: 1.03 mg/dL (ref 0.61–1.24)
GFR, Estimated: >60 mL/min (ref >60)
Glucose, Bld: 101 mg/dL — ABNORMAL HIGH (ref 70–99)
Potassium: 4.6 mmol/L (ref 3.5–5.1)
Sodium: 137 mmol/L (ref 135–145)

## 2021-01-22 LAB — CBC
HCT: 45.9 % (ref 39.0–52.0)
Hemoglobin: 15.9 g/dL (ref 13.0–17.0)
MCH: 31.3 pg (ref 26.0–34.0)
MCHC: 34.6 g/dL (ref 30.0–36.0)
MCV: 90.4 fL (ref 80.0–100.0)
Platelets: 201 10*3/uL (ref 150–400)
RBC: 5.08 MIL/uL (ref 4.22–5.81)
RDW: 12.7 % (ref 11.5–15.5)
WBC: 8.3 10*3/uL (ref 4.0–10.5)
nRBC: 0 % (ref 0.0–0.2)

## 2021-01-22 SURGERY — RIGHT HEART CATH
Anesthesia: LOCAL

## 2021-01-22 MED ORDER — SODIUM CHLORIDE 0.9% FLUSH: 3.0000 mL | INTRAVENOUS | Status: DC | PRN

## 2021-01-22 MED ORDER — HEPARIN (PORCINE) IN NACL 1000-0.9 UT/500ML-% IV SOLN
INTRAVENOUS | Status: AC
  Filled 2021-01-22: qty 1000

## 2021-01-22 MED ORDER — HYDRALAZINE HCL 20 MG/ML IJ SOLN: 10.0000 mg | INTRAMUSCULAR | Status: DC | PRN

## 2021-01-22 MED ORDER — ONDANSETRON HCL 4 MG/2ML IJ SOLN: 4.0000 mg | Freq: Four times a day (QID) | INTRAMUSCULAR | Status: DC | PRN

## 2021-01-22 MED ORDER — SODIUM CHLORIDE 0.9% FLUSH: 3.0000 mL | Freq: Two times a day (BID) | INTRAVENOUS | Status: DC

## 2021-01-22 MED ORDER — SODIUM CHLORIDE 0.9 % IV SOLN: 250.0000 mL | INTRAVENOUS | Status: DC | PRN

## 2021-01-22 MED ORDER — ACETAMINOPHEN 325 MG PO TABS: 650.0000 mg | ORAL_TABLET | ORAL | Status: DC | PRN

## 2021-01-22 MED ORDER — LIDOCAINE HCL (PF) 1 % IJ SOLN
INTRAMUSCULAR | Status: DC | PRN
  Administered 2021-01-22 (×2): 2 mL

## 2021-01-22 MED ORDER — SODIUM CHLORIDE 0.9 % IV SOLN: INTRAVENOUS | Status: DC

## 2021-01-22 MED ORDER — HEPARIN (PORCINE) IN NACL 1000-0.9 UT/500ML-% IV SOLN
INTRAVENOUS | Status: DC | PRN
  Administered 2021-01-22: 500 mL

## 2021-01-22 MED ORDER — LIDOCAINE HCL (PF) 1 % IJ SOLN
INTRAMUSCULAR | Status: AC
  Filled 2021-01-22: qty 30

## 2021-01-22 MED ORDER — LABETALOL HCL 5 MG/ML IV SOLN: 10.0000 mg | INTRAVENOUS | Status: DC | PRN

## 2021-01-22 MED ORDER — SODIUM CHLORIDE 0.9 % IV SOLN
INTRAVENOUS | Status: AC | PRN
  Administered 2021-01-22: 10 mL/h via INTRAVENOUS

## 2021-01-22 SURGICAL SUPPLY — 10 items
CATH BALLN WEDGE 5F 110CM (CATHETERS) ×1 IMPLANT
KIT MICROPUNCTURE NIT STIFF (SHEATH) ×1 IMPLANT
PACK CARDIAC CATHETERIZATION (CUSTOM PROCEDURE TRAY) ×2 IMPLANT
PROTECTION STATION PRESSURIZED (MISCELLANEOUS) ×2
SHEATH GLIDE SLENDER 4/5FR (SHEATH) ×1 IMPLANT
SHEATH PROBE COVER 6X72 (BAG) ×1 IMPLANT
STATION PROTECTION PRESSURIZED (MISCELLANEOUS) IMPLANT
TRANSDUCER W/STOPCOCK (MISCELLANEOUS) ×2 IMPLANT
TUBING ART PRESS 72  MALE/FEM (TUBING) ×2
TUBING ART PRESS 72 MALE/FEM (TUBING) IMPLANT

## 2021-01-22 NOTE — Interval H&P Note (Signed)
History and Physical Interval Note:  01/22/2021 10:37 AM  Warren [name]  has presented today for surgery, with the diagnosis of pulmonary hypertension.  The various methods of treatment have been discussed with the patient and family. After consideration of risks, benefits and other options for treatment, the patient has consented to  Procedure(s): RIGHT HEART CATH (N/A) as a surgical intervention.  The patient's history has been reviewed, patient examined, no change in status, stable for surgery.  I have reviewed the patient's chart and labs.  Questions were answered to the patient's satisfaction.     Marcas Bowsher

## 2021-01-22 NOTE — Telephone Encounter (Addendum)
Advanced Heart Failure Patient Advocate Encounter   Received notification from [facility] that prior authorization for Uptravi 200/832mg is required.   PA submitted on CoverMyMeds Key [id number]  Status is pending   Will continue to follow.

## 2021-01-22 NOTE — Progress Notes (Signed)
Discharge instructions reviewed with pt and his wife both voice understanding.

## 2021-01-23 NOTE — Telephone Encounter (Signed)
Advanced Heart Failure Patient Advocate Encounter  Prior Authorization for Warren [name] has been approved.    PA#  [id number] Effective dates: 05/24/20 through 05/23/21  Called and spoke with patient. Sent referral via email. Will fax in once signature is obtained.

## 2021-02-20 DIAGNOSIS — K219 Gastro-esophageal reflux disease without esophagitis: Secondary | ICD-10-CM | POA: Diagnosis not present

## 2021-02-20 DIAGNOSIS — I10 Essential (primary) hypertension: Secondary | ICD-10-CM | POA: Diagnosis not present

## 2021-02-23 NOTE — Telephone Encounter (Addendum)
Patient called today asking about his bipap machine. His order was sent over in March 2022 to [facility]. I reached out to [facility] (RT) and was told the patient did not meet criteria per the medicare guidelines for bipap because he did not fail cpap first and then was moved to bipap. After investigating his sleep studies it was documented that he failed cpap and was moved to bipap. That paperwork was faxed to them and [place] will resubmit his paperwork to medicare. Patient was notified.

## 2021-02-24 ENCOUNTER — Other Ambulatory Visit: Payer: Self-pay | Admitting: Cardiovascular Disease

## 2021-03-05 ENCOUNTER — Telehealth: Payer: Self-pay | Admitting: Cardiovascular Disease

## 2021-03-05 MED ORDER — ROSUVASTATIN CALCIUM 5 MG PO TABS: 5.0000 mg | ORAL_TABLET | Freq: Every day | ORAL | 0 refills | Status: DC

## 2021-03-05 NOTE — Telephone Encounter (Signed)
*  STAT* If patient is at the pharmacy, call can be transferred to refill team.   1. Which medications need to be refilled? (please list name of each medication and dose if known) rosuvastatin (CRESTOR) 5 MG tablet  2. Which pharmacy/location (including street and city if local pharmacy) is medication to be sent to? [facility] [place] [place] [facility]  3. Do they need a 30 day or 90 day supply? [place]

## 2021-03-12 ENCOUNTER — Other Ambulatory Visit (HOSPITAL_COMMUNITY): Payer: Self-pay | Admitting: *Deleted

## 2021-03-12 MED ORDER — UPTRAVI 800 MCG PO TABS: 800.0000 ug | ORAL_TABLET | Freq: Two times a day (BID) | ORAL | 3 refills | Status: DC

## 2021-03-12 NOTE — Telephone Encounter (Signed)
Pt called to report side effects from increasing uptravi to 1059mcg bid.  Pt said since increasing hes had debilitating headaches and the veins in his forehead bulge out. Also has aching in legs. Pt was told to contact our office about down titration because of side effects. Per Kevan Rosebush ,RN ok to decrease to 885mcg bid. Pt aware new script sent to [facility].

## 2021-03-12 NOTE — Telephone Encounter (Signed)
Advanced Heart Failure Patient Advocate Encounter  Patient has been receiving Uptravi with TAF grant through [place].   Charlann Boxer, CPhT

## 2021-03-16 ENCOUNTER — Other Ambulatory Visit: Payer: Self-pay

## 2021-03-16 MED ORDER — ROSUVASTATIN CALCIUM 5 MG PO TABS: 5.0000 mg | ORAL_TABLET | Freq: Every day | ORAL | 0 refills | Status: DC

## 2021-03-16 NOTE — Progress Notes (Signed)
Insurance requires 90 day supply- 30 day supply of medication was sent in due to being overdue for a follow up visit.   Note to pharmacy was placed advising patient to schedule appointment, during this time- he should call to schedule. Another refill request should require a phone call to get scheduled for appointment.   Thank you!

## 2021-03-19 DIAGNOSIS — Z23 Encounter for immunization: Secondary | ICD-10-CM | POA: Diagnosis not present

## 2021-04-07 ENCOUNTER — Telehealth (HOSPITAL_COMMUNITY): Payer: Self-pay

## 2021-04-07 NOTE — Telephone Encounter (Signed)
error 

## 2021-04-10 DIAGNOSIS — Z23 Encounter for immunization: Secondary | ICD-10-CM | POA: Diagnosis not present

## 2021-04-24 ENCOUNTER — Telehealth (HOSPITAL_COMMUNITY): Payer: Self-pay | Admitting: Pharmacy Technician

## 2021-04-24 NOTE — Telephone Encounter (Signed)
Advanced Heart Failure Patient Advocate Encounter  Advanced Heart Failure Patient Advocate Encounter  Prior Authorization for Sildenafil has been submitted and approved.    PA# [id number] Effective dates: 05/24/21 through 05/23/22  I have tried to proactively submit the Opsumit twice for 2023. Insurance has come back stating that the medication is already covered. Will try again on Monday. If we can not submit electronically, will call patient's insurance and see if it can be submitted over the phone. Called and spoke with the patient.

## 2021-04-27 ENCOUNTER — Other Ambulatory Visit (HOSPITAL_COMMUNITY): Payer: Self-pay

## 2021-04-28 ENCOUNTER — Telehealth: Payer: Medicare Other | Admitting: Cardiology

## 2021-04-28 ENCOUNTER — Other Ambulatory Visit (HOSPITAL_COMMUNITY): Payer: Self-pay

## 2021-04-28 NOTE — Telephone Encounter (Signed)
Advanced Heart Failure Patient Advocate Encounter  Called patient's insurance ([name], [phone]) and got an Opsumit and [place] [place] key created for 2023 on CMM. Upon submission, the insurance came back stating that the patient already has coverage.  Called patient's insurance back, asked for a 2023 verbal PA to be started for both medications.   Advanced Heart Failure Patient Advocate Encounter  Prior Authorization for Opsumit has been approved.    PA# [id number] Effective dates: 05/24/21 through 05/23/22  Patients insurance states that the [place] PA will have to be sent to a clinical team in order to get a determination but has been submitted. Est time frame, 72 hours.  Case # [id number]  Called and updated the patient. He is trying to decide if he should switch insurances. Will wait on a determination to do so. I will call back once received.

## 2021-04-30 ENCOUNTER — Other Ambulatory Visit (HOSPITAL_COMMUNITY): Payer: Self-pay

## 2021-04-30 MED ORDER — UPTRAVI 800 MCG PO TABS: 800.0000 ug | ORAL_TABLET | Freq: Two times a day (BID) | ORAL | 3 refills | Status: DC

## 2021-04-30 NOTE — Telephone Encounter (Signed)
Advanced Heart Failure Patient Advocate Encounter  Received communication from the patient's insurance stating that the Warren [name] is already covered. It will not let me submit for the 2023 year at this time. Called and updated the patient.  Of note, the patient currently received funding through TAF that covers the co-pays of Opsumit, Uptravi and Sildenafil. That coverage will be ending on 05/23/21. The patient is currently in the pending stage with TAF for 2023 eligibility. They expect to have those completed by mid Jan.   Moussa Wiegand F Darlin Stenseth, CPhT

## 2021-05-26 ENCOUNTER — Other Ambulatory Visit (HOSPITAL_COMMUNITY): Payer: Self-pay

## 2021-05-27 ENCOUNTER — Encounter: Payer: Self-pay | Admitting: Cardiology

## 2021-05-27 ENCOUNTER — Telehealth (INDEPENDENT_AMBULATORY_CARE_PROVIDER_SITE_OTHER): Payer: Medicare Other | Admitting: Cardiology

## 2021-05-27 ENCOUNTER — Other Ambulatory Visit: Payer: Self-pay

## 2021-05-27 VITALS — BP 130/79 | HR 76 | Ht 69.0 in | Wt 183.0 lb

## 2021-05-27 DIAGNOSIS — G4733 Obstructive sleep apnea (adult) (pediatric): Secondary | ICD-10-CM | POA: Diagnosis not present

## 2021-05-27 DIAGNOSIS — I10 Essential (primary) hypertension: Secondary | ICD-10-CM | POA: Diagnosis not present

## 2021-05-27 NOTE — Patient Instructions (Signed)
Medication Instructions:  Your physician recommends that you continue on your current medications as directed. Please refer to the Current Medication list given to you today.  *If you need a refill on your cardiac medications before your next appointment, please call your pharmacy*   Lab Work: None ordered   If you have labs (blood work) drawn today and your tests are completely normal, you will receive your results only by: [place] (if you have MyChart) OR A paper copy in the mail If you have any lab test that is abnormal or we need to change your treatment, we will call you to review the results.   Testing/Procedures: None ordered    Follow-Up: Follow up as scheduled     Other Instructions Our sleep coordinator Gae Bon will contact you about further sleep testing

## 2021-05-27 NOTE — Progress Notes (Signed)
Virtual Visit via Video Note   This visit type was conducted due to national recommendations for restrictions regarding the COVID-19 Pandemic (e.g. social distancing) in an effort to limit this patient's exposure and mitigate transmission in our community.  Due to his co-morbid illnesses, this patient is at least at moderate risk for complications without adequate follow up.  This format is felt to be most appropriate for this patient at this time.  All issues noted in this document were discussed and addressed.  A limited physical exam was performed with this format.  Please refer to the patient's chart for his consent to telehealth for [facility].       Date:  05/27/2021   ID:  Warren [name], DOB 1951/05/09, MRN [id number] The patient was identified using 2 identifiers.  Patient Location: Home Provider Location: Home Office   PCP:  [name], MD   [facility] Providers Cardiologist:  Warren Field, MD     Evaluation Performed:  Follow-Up Visit  Chief Complaint:  OSA  History of Present Illness:    Warren [name] is a 71 y.o. male with a hx of CHF, CAD, HTN and HLD who was referred for  Home sleep study with [facility] for pulmonary HTN.  He was found to have very mild OSA with an AHI of 5/hr but increased to 11.9/hr during REM sleep. He had nocturnal hypoxemia with O2 sats < 88% for 32 min.  He was initially titrated on CPAP but due to ongoing respiratory events was changed to BiPAP at 9/5cm H2O.    He is doing well with his BiPAP device and thinks that he has gotten used to it.  He tolerates the mask except it give him some irritation on the bridge of his nose.  He feels the pressure is adequate.  Since going on BiPAP he feels rested in the am and has no significant daytime sleepiness.  He denies any significant mouth or nasal dryness or nasal congestion.  He does not think that he snores.   He had an overnight pulse ox on BiPAP and 3L a few months ago with residual  nocturnal hypoxemia with O2 sats <88% for 10 minutes.  He said at that time he was having problems getting adjusted to PAP but now is doing well.   The patient does not have symptoms concerning for COVID-19 infection (fever, chills, cough, or new shortness of breath).    Past Medical History:  Diagnosis Date   CHF (congestive heart failure) ([place])    Coronary artery disease    Hyperlipidemia    Hypertension    Past Surgical History:  Procedure Laterality Date   CARDIAC CATHETERIZATION     RIGHT HEART CATH N/A 01/22/2021   Procedure: RIGHT HEART CATH;  Surgeon: Jolaine Artist, MD;  Location: [place] [facility];  Service: Cardiovascular;  Laterality: N/A;   RIGHT/LEFT HEART CATH AND CORONARY ANGIOGRAPHY N/A 12/14/2019   Procedure: RIGHT/LEFT HEART CATH AND CORONARY ANGIOGRAPHY;  Surgeon: Jolaine Artist, MD;  Location: [place] [facility];  Service: Cardiovascular;  Laterality: N/A;     Current Meds  Medication Sig   acetaminophen (TYLENOL) 500 MG tablet Take 1,000 mg by mouth every 6 (six) hours as needed for moderate pain or headache.   aspirin EC 81 MG tablet Take 1 tablet (81 mg total) by mouth daily.   ezetimibe (ZETIA) 10 MG tablet Take 10 mg by mouth daily.   hydrochlorothiazide (HYDRODIURIL) 25 MG tablet  Take 25 mg by mouth daily.    macitentan (OPSUMIT) 10 MG tablet Take 1 tablet (10 mg total) by mouth daily.   OXYGEN Inhale 2-3 L into the lungs continuous. 2 L at rest and with activity 3 L   pantoprazole (PROTONIX) 40 MG tablet Take 40 mg by mouth daily.   potassium chloride (KLOR-CON) 10 MEQ tablet Take 2 tablets (20 mEq total) by mouth daily.   rosuvastatin (CRESTOR) 5 MG tablet Take 1 tablet (5 mg total) by mouth daily.   Selexipag (UPTRAVI) 800 MCG TABS Take 1 tablet (800 mcg total) by mouth 2 (two) times daily.   sildenafil (REVATIO) 20 MG tablet Take 3 tablets (60 mg total) by mouth 3 (three) times daily.   [DISCONTINUED] White Petrolatum-Mineral Oil (SYSTANE  NIGHTTIME OP) Place 1 application into both eyes at bedtime as needed (allergies).     Allergies:   Amlodipine besylate, Penicillins, and Valsartan   Social History   Tobacco Use   Smoking status: Former    Types: Cigarettes    Quit date: 05/24/2002    Years since quitting: 19.0   Smokeless tobacco: Never  Substance Use Topics   Alcohol use: Not Currently   Drug use: Never     Family Hx: The patient's family history includes Heart disease in his mother.  ROS:   Please see the history of present illness.     All other systems reviewed and are negative.   Prior CV studies:   The following studies were reviewed today:  HST, CPAP titration, PAP download  Labs/Other Tests and Data Reviewed:    EKG:  No ECG reviewed.  Recent Labs: 09/01/2020: B Natriuretic Peptide 18.4 01/22/2021: BUN 14; Creatinine, Ser 1.03; Hemoglobin 14.6; Hemoglobin 14.6; Platelets 201; Potassium 3.8; Potassium 3.7; Sodium 139; Sodium 139   Recent Lipid Panel Lab Results  Component Value Date/Time   CHOL 133 11/16/2008 12:57 AM   TRIG 120 11/16/2008 12:57 AM   HDL 41 11/16/2008 12:57 AM   CHOLHDL 3.2 Ratio 11/16/2008 12:57 AM   LDLCALC 68 11/16/2008 12:57 AM    Wt Readings from Last 3 Encounters:  05/27/21 183 lb (83 kg)  01/22/21 188 lb 9.6 oz (85.5 kg)  01/01/21 194 lb (88 kg)     Risk Assessment/Calculations:          Objective:    Vital Signs:  BP 130/79    Pulse 76    Ht 5\' 9"  (1.753 m)    Wt 183 lb (83 kg)    BMI 27.02 kg/m    VITAL SIGNS:  reviewed GEN:  no acute distress EYES:  sclerae anicteric, EOMI - Extraocular Movements Intact RESPIRATORY:  normal respiratory effort, symmetric expansion CARDIOVASCULAR:  no peripheral edema SKIN:  no rash, lesions or ulcers. MUSCULOSKELETAL:  no obvious deformities. NEURO:  alert and oriented x 3, no obvious focal deficit PSYCH:  normal affect  ASSESSMENT & PLAN:    OSA - The patient is tolerating PAP therapy well without any  problems. The PAP download performed by his DME was personally reviewed and interpreted by me today and showed an AHI of 2/hr on 9/5 cm H2O with 100% compliance in using more than 4 hours nightly.  The patient has been using and benefiting from PAP use and will continue to benefit from therapy.  -I have recommended that he get an appt with his DME to look at other FFM that do not ride over the bridge of his nose to see if they  are better tolerated -I will get an overnight Pulse ox on BiPAP and 3L to see if his O2 sats have improved  2.  HTN -BP controlled on exam today -continue prescription drug management with  HCTZ 25mg  daily with PRN refills           COVID-19 Education: The signs and symptoms of COVID-19 were discussed with the patient and how to seek care for testing (follow up with PCP or arrange E-visit).  The importance of social distancing was discussed today.  Time:   Today, I have spent 20 minutes with the patient with telehealth technology discussing the above problems.     Medication Adjustments/Labs and Tests Ordered: Current medicines are reviewed at length with the patient today.  Concerns regarding medicines are outlined above.   Tests Ordered: No orders of the defined types were placed in this encounter.   Medication Changes: No orders of the defined types were placed in this encounter.   Follow Up:  Virtual Visit  in 2 month(s)  Signed, Fransico Him, MD  05/27/2021 10:37 AM    [place]

## 2021-05-28 ENCOUNTER — Telehealth: Payer: Self-pay | Admitting: *Deleted

## 2021-05-28 ENCOUNTER — Telehealth (HOSPITAL_COMMUNITY): Payer: Self-pay | Admitting: Pharmacy Technician

## 2021-05-28 DIAGNOSIS — G4733 Obstructive sleep apnea (adult) (pediatric): Secondary | ICD-10-CM

## 2021-05-28 DIAGNOSIS — G4734 Idiopathic sleep related nonobstructive alveolar hypoventilation: Secondary | ICD-10-CM

## 2021-05-28 NOTE — Telephone Encounter (Signed)
Per dr Radford Pax: Patient needs ONO on Bipap 3L [place] for nocturnal hypoxemia

## 2021-05-28 NOTE — Telephone Encounter (Signed)
Order faxed to [facility].  

## 2021-05-28 NOTE — Telephone Encounter (Signed)
Advanced Heart Failure Patient Advocate Encounter  Patient called and inquired about PA for Uptravi for 2023. [facility]) attempted to submit PA on 01/03. Insurance is still stating that the patient has coverage for the medication, no PA is needed at this time. Called and spoke with the patient. Insurance sometimes will let a transition refill be done at the beginning of the year and require the PA in Feb or March, we will keep an eye out for that.  Inquired if he was approved for a TAF grant. He has been approved through 05/23/22. He stated that [facility] helped him apply for a [employer] grant for Opsumit last July. I told him that the TAF grant will cover Uptravi, Sildenafil and Opsumit regardless of cost when that grant runs out. I advised him to call if he has issues with that.  Charlann Boxer, CPhT

## 2021-06-03 ENCOUNTER — Other Ambulatory Visit (HOSPITAL_COMMUNITY): Payer: Self-pay | Admitting: Internal Medicine

## 2021-06-04 ENCOUNTER — Telehealth: Payer: Medicare Other | Admitting: Cardiology

## 2021-06-15 ENCOUNTER — Ambulatory Visit (INDEPENDENT_AMBULATORY_CARE_PROVIDER_SITE_OTHER): Payer: Medicare Other | Admitting: Orthopedic Surgery

## 2021-06-15 ENCOUNTER — Encounter: Payer: Self-pay | Admitting: Orthopedic Surgery

## 2021-06-15 ENCOUNTER — Other Ambulatory Visit: Payer: Self-pay

## 2021-06-15 DIAGNOSIS — M7552 Bursitis of left shoulder: Secondary | ICD-10-CM

## 2021-06-15 DIAGNOSIS — M25512 Pain in left shoulder: Secondary | ICD-10-CM

## 2021-06-15 MED ORDER — TRAMADOL HCL 50 MG PO TABS: 50.0000 mg | ORAL_TABLET | Freq: Four times a day (QID) | ORAL | 5 refills | Status: DC | PRN

## 2021-06-15 NOTE — Progress Notes (Signed)
Chief Complaint  Patient presents with   Shoulder Pain    Left/ still painful    71 year old male with pulmonary artery hypertension on oxygen complains of left shoulder pain history of right shoulder pain treated with injection   Injection left shoulder still painful.  Repeat examination reveals only 80 degrees of abduction and 70 degrees of flexion with painful range of motion  We discussed possible treatment options which do not include surgery because of the patient's severe pulmonary disease  Options are  Repeat injection  Oral medication, he says he has been told he can only take tramadol  Physical therapy   We will try all 3  Procedure note the subacromial injection shoulder left   Verbal consent was obtained to inject the  Left   Shoulder  Timeout was completed to confirm the injection site is a subacromial space of the  left  shoulder  Medication used Depo-Medrol 40 mg and lidocaine 1% 3 cc  Anesthesia was provided by ethyl chloride  The injection was performed in the left  posterior subacromial space. After pinning the skin with alcohol and anesthetized the skin with ethyl chloride the subacromial space was injected using a 20-gauge needle. There were no complications  Sterile dressing was applied.

## 2021-06-24 ENCOUNTER — Other Ambulatory Visit: Payer: Self-pay | Admitting: Cardiovascular Disease

## 2021-07-02 ENCOUNTER — Other Ambulatory Visit: Payer: Self-pay

## 2021-07-02 ENCOUNTER — Ambulatory Visit (HOSPITAL_COMMUNITY): Payer: Medicare Other | Attending: Orthopedic Surgery | Admitting: Occupational Therapy

## 2021-07-02 ENCOUNTER — Encounter (HOSPITAL_COMMUNITY): Payer: Self-pay | Admitting: Occupational Therapy

## 2021-07-02 DIAGNOSIS — M25612 Stiffness of left shoulder, not elsewhere classified: Secondary | ICD-10-CM | POA: Diagnosis not present

## 2021-07-02 DIAGNOSIS — M25512 Pain in left shoulder: Secondary | ICD-10-CM | POA: Insufficient documentation

## 2021-07-02 DIAGNOSIS — G8929 Other chronic pain: Secondary | ICD-10-CM | POA: Diagnosis not present

## 2021-07-02 DIAGNOSIS — R29898 Other symptoms and signs involving the musculoskeletal system: Secondary | ICD-10-CM | POA: Insufficient documentation

## 2021-07-02 NOTE — Therapy (Signed)
[facility] [place], [place], [address] Phone: [phone]   Fax:  [phone]  Occupational Therapy Evaluation  Patient Details  Name: Warren [name] MRN: [id number] Date of Birth: [birth year] Referring Provider (OT): Dr. Arther Abbott   Encounter Date: 07/02/2021   OT End of Session - 07/02/21 1315     Visit Number 1    Number of Visits 4    Date for OT Re-Evaluation 08/01/21    Authorization Type 1) Medicare A & B 2) [employer] MCR    Progress Note Due on Visit 10    OT Start Time 0950    OT Stop Time 1028    OT Time Calculation (min) 38 min    Activity Tolerance Patient tolerated treatment well    Behavior During Therapy [facility] for tasks assessed/performed             Past Medical History:  Diagnosis Date   CHF (congestive heart failure) ([place])    Coronary artery disease    Hyperlipidemia    Hypertension     Past Surgical History:  Procedure Laterality Date   CARDIAC CATHETERIZATION     RIGHT HEART CATH N/A 01/22/2021   Procedure: RIGHT HEART CATH;  Surgeon: Jolaine Artist, MD;  Location: [place] [facility];  Service: Cardiovascular;  Laterality: N/A;   RIGHT/LEFT HEART CATH AND CORONARY ANGIOGRAPHY N/A 12/14/2019   Procedure: RIGHT/LEFT HEART CATH AND CORONARY ANGIOGRAPHY;  Surgeon: Jolaine Artist, MD;  Location: [place] [facility];  Service: Cardiovascular;  Laterality: N/A;    There were no vitals filed for this visit.   Subjective Assessment - 07/02/21 1312     Subjective  S: It really started hurting around December.    Pertinent History Pt is a 71 y/o male presenting with left shoulder pain, MD suspects possible RC tear however pt is not a surgical candidate. Pt has received 2 injections, the first did not help, the second helped a little bit. Pt was referred to occupational therapy for evaluation and treatment by Dr. Arther Abbott.    Special Tests FOTO: 53/100    Patient Stated  Goals To be able to use my arm more.    Currently in Pain? Yes    Pain Score 3     Pain Location Shoulder    Pain Orientation Left    Pain Descriptors / Indicators Aching    Pain Type Chronic pain    Pain Radiating Towards N/A    Pain Onset More than a month ago    Pain Frequency Intermittent    Aggravating Factors  certain movements    Pain Relieving Factors unsure    Effect of Pain on Daily Activities mod effect on ADLs    Multiple Pain Sites No               [facility] OT Assessment - 07/02/21 0948       Assessment   Medical Diagnosis left shoulder pain    Referring Provider (OT) Dr. Arther Abbott    Onset Date/Surgical Date 04/23/21    Hand Dominance Right    Next MD Visit 09/14/2021    Prior Therapy None      Precautions   Precautions None      Restrictions   Weight Bearing Restrictions No      Balance Screen   Has the patient fallen in the past 6 months No      Prior Function  Level of Independence Independent    Vocation Retired    Leisure none      ADL   ADL comments Pt is having difficulty with dressing, reaching overhead and behind back is difficult. Pt is unable to sleep on the left side. Pt has difficulty with lifting or carrying objects.      Written Expression   Dominant Hand Right      Cognition   Overall Cognitive Status Within Functional Limits for tasks assessed      Observation/Other Assessments   Focus on Therapeutic Outcomes (FOTO)  53/100      ROM / Strength   AROM / PROM / Strength AROM;PROM;Strength      Palpation   Palpation comment Mod fascial restrictions along upper arm and trapezius regions      AROM   Overall AROM Comments Assessed seated, er/IR adducted    AROM Assessment Site Shoulder    Right/Left Shoulder Left    Left Shoulder Flexion 111 Degrees    Left Shoulder ABduction 120 Degrees    Left Shoulder Internal Rotation 90 Degrees    Left Shoulder External Rotation 40 Degrees        Strength   Overall Strength  Comments Assessed seated, er/IR adducted    Strength Assessment Site Shoulder    Right/Left Shoulder Left    Left Shoulder Flexion 4-/5    Left Shoulder ABduction 4-/5    Left Shoulder Internal Rotation 5/5    Left Shoulder External Rotation 4+/5                              OT Education - 07/02/21 1315     Education Details AA/ROM exercises    Person(s) Educated Patient    Methods Explanation;Demonstration;Handout    Comprehension Verbalized understanding;Returned demonstration              OT Short Term Goals - 07/02/21 1320       OT SHORT TERM GOAL #1   Title Pt will be provided with and educated on HEP to improve mobility required for ADL completion using LUE as non-dominant.    Time 4    Period Weeks    Status New    Target Date 08/01/21      OT SHORT TERM GOAL #2   Title Pt will decrease pain in LUE to 3/10 or less to improve ability to find a comfortable position for sleeping.    Time 4    Period Weeks    Status New      OT SHORT TERM GOAL #3   Title Pt will increase LUE A/ROM to [facility] to improve ability to reach overhead and behind back during dressing tasks.    Time 4    Period Weeks    Status New      OT SHORT TERM GOAL #4   Title Pt will decrease fascial restrictions to minimal amounts to improve mobility required for functional reaching tasks.    Time 4    Period Weeks    Status New      OT SHORT TERM GOAL #5   Title Pt will increase LUE strength to 4/5 or greater throughout to improve ability to perform light lifting tasks during ADLs.    Time 4    Period Weeks    Status New  Plan - 07/02/21 1316     Clinical Impression Statement A: Pt is a 71 y/o male presenting with left shoulder pain, present for several months with no known origin, that is limiting his functional use of the LUE during daily tasks. Pt is not a surgical candidate, is open to trying therapy to maintain and improve functioning.     OT Occupational Profile and History Problem Focused Assessment - Including review of records relating to presenting problem    Occupational performance deficits (Please refer to evaluation for details): ADL's;IADL's;Rest and Sleep;Leisure    Body Structure / Function / Physical Skills ADL;Endurance;Muscle spasms;UE functional use;Fascial restriction;Pain;ROM;IADL;Strength    Rehab Potential Good    Clinical Decision Making Limited treatment options, no task modification necessary    Comorbidities Affecting Occupational Performance: None    Modification or Assistance to Complete Evaluation  No modification of tasks or assist necessary to complete eval    OT Frequency 1x / week    OT Duration 4 weeks    OT Treatment/Interventions Self-care/ADL training;Ultrasound;DME and/or AE instruction;Patient/family education;Passive range of motion;Cryotherapy;Electrical Stimulation;Moist Heat;Therapeutic exercise;Manual Therapy;Therapeutic activities    Plan P: Pt will benefit from skilled OT services to decrease pain and fascial restrictions, increase joint ROM, strength, and functional use of the LUE. Treatment plan: myofascial release, manual techniques, P/ROM, AA/ROM, A/ROM, general LUE strengthening, scapular mobility/stability/strengthening, modalities prn    OT Home Exercise Plan eval: AA/ROM exercises    Consulted and Agree with Plan of Care Patient             Patient will benefit from skilled therapeutic intervention in order to improve the following deficits and impairments:   Body Structure / Function / Physical Skills: ADL, Endurance, Muscle spasms, UE functional use, Fascial restriction, Pain, ROM, IADL, Strength       Visit Diagnosis: Chronic left shoulder pain  Stiffness of left shoulder, not elsewhere classified  Other symptoms and signs involving the musculoskeletal system    Problem List Patient Active Problem List   Diagnosis Date Noted   Cor pulmonale, chronic ([place])  12/06/2019   COPD GOLD 1  10/30/2019   Chronic respiratory failure with hypoxia ([place]) 10/30/2019   Postinflammatory pulmonary fibrosis ([place]) 10/29/2019   DOE (dyspnea on exertion) 10/29/2019   Supplemental oxygen dependent 10/23/2019   Pain in joint of right hip 11/10/2018   Bowen disease 10/02/2018   GOUT, UNSPECIFIED 08/16/2008   DIABETES MELLITUS, TYPE II, CONTROLLED 10/05/2007   DERMATITIS, SEBORRHEIC 11/11/2006   FASCIITIS, PLANTAR 08/30/2006   COLONIC POLYPS 08/29/2006   RENAL CALCULUS 08/29/2006   HLD (hyperlipidemia) 07/26/2006   Essential hypertension 07/26/2006    Guadelupe Sabin, OTR/L  [phone] 07/02/2021, 1:23 PM  [place], [place], [address] Phone: [phone]   Fax:  [phone]  Name: DAVEION ROBAR MRN: [id number] Date of Birth: 27-Aug-1950

## 2021-07-02 NOTE — Patient Instructions (Signed)

## 2021-07-05 NOTE — Progress Notes (Signed)
Cardiology Office Note:   Date:  07/06/2021  NAME:  ADRAIN NESBIT    MRN: [id number] DOB:  03-26-1951   PCP:  [name], MD  Cardiologist:  [name], MD  Electrophysiologist:  None   Referring MD: [name], MD   Chief Complaint  Patient presents with   Follow-up        History of Present Illness:   YOCHANAN EDDLEMAN is a 71 y.o. male with a hx of iPAH, chronic respiratory failure, HTN, HLD, non-obstructive CAD who presents for follow-up.  He reports he is doing fairly well.  Still short of breath with activity.  Most recent right heart catheterization numbers show that he is right heart pressures are mildly elevated.  He did have intensification of therapy.  Currently on triple therapy with macitentan, sildenafil, selexipag.  He will see Dr. Haroldine Laws later this week for further evaluation.  No symptoms of congestive heart failure.  His shortness of breath.  Requires oxygen due to chronic respiratory failure related to primary arterial pulmonary hypertension.  He reports he is a little depressed.  He has joined a support group.  He apparently has a friend in [place] who has pulmonary hypertension.  He reports this does provide him with improvement in his mental health.  Blood pressure is well controlled.  On aspirin and statin.  Most recent lipid profile is at goal.  Problem List 1. Idiopathic pulmonary hypertension  -RVSP 44 -> 68 (2020->2021) -PFT 08/31/2019: no obstruction, severe diffusion defect (DCLO 25%) -CT negative for ILD -ANA negative, anti-CCP negative, anti-RA negative -VQ scan negative CTEPH -minimal OSA -RHC 12/14/2019: mPAP 44, PVR 7.8 WU -RHC 01/22/2021: mPAP 28, PVR 4.4 WU 2. HLD -T chol 102, HDL 39, LDL 44, triglycerides 100 3. HTN -A1c 5. 7 4.  CAD -Three-vessel calcification seen on recent lung CT -Nuclear medicine stress test normal September 2020 -LHC 12/14/2019: 20% RCA 40% LAD    Past Medical History: Past Medical History:  Diagnosis Date   CHF  (congestive heart failure) ([place])    Coronary artery disease    Hyperlipidemia    Hypertension     Past Surgical History: Past Surgical History:  Procedure Laterality Date   CARDIAC CATHETERIZATION     RIGHT HEART CATH N/A 01/22/2021   Procedure: RIGHT HEART CATH;  Surgeon: Jolaine Artist, MD;  Location: [place] [facility];  Service: Cardiovascular;  Laterality: N/A;   RIGHT/LEFT HEART CATH AND CORONARY ANGIOGRAPHY N/A 12/14/2019   Procedure: RIGHT/LEFT HEART CATH AND CORONARY ANGIOGRAPHY;  Surgeon: Jolaine Artist, MD;  Location: [facility];  Service: Cardiovascular;  Laterality: N/A;    Current Medications: Current Meds  Medication Sig   acetaminophen (TYLENOL) 500 MG tablet Take 1,000 mg by mouth every 6 (six) hours as needed for moderate pain or headache.   aspirin EC 81 MG tablet Take 1 tablet (81 mg total) by mouth daily.   ezetimibe (ZETIA) 10 MG tablet Take 10 mg by mouth daily.   hydrochlorothiazide (HYDRODIURIL) 25 MG tablet Take 25 mg by mouth daily.    macitentan (OPSUMIT) 10 MG tablet Take 1 tablet (10 mg total) by mouth daily.   OXYGEN Inhale 2-3 L into the lungs continuous. 2 L at rest and with activity 3 L   pantoprazole (PROTONIX) 40 MG tablet Take 40 mg by mouth daily.   potassium chloride (KLOR-CON) 10 MEQ tablet Take 2 tablets (20 mEq total) by mouth daily. PLEASE CALL OFFICE TO  SCHEDULE FOLLOW UP APPOINTMENT   rosuvastatin (CRESTOR) 5 MG tablet TAKE 1 TABLET DAILY   Selexipag (UPTRAVI) 800 MCG TABS Take 1 tablet (800 mcg total) by mouth 2 (two) times daily.   sildenafil (REVATIO) 20 MG tablet Take 3 tablets (60 mg total) by mouth 3 (three) times daily.   traMADol (ULTRAM) 50 MG tablet Take 1 tablet (50 mg total) by mouth every 6 (six) hours as needed.     Allergies:    Amlodipine besylate, Penicillins, and Valsartan   Social History: Social History   Socioeconomic History   Marital status: Married    Spouse name: Not on file   Number of  children: Not on file   Years of education: Not on file   Highest education level: Bachelor's degree (e.g., BA, AB, BS)  Occupational History   Not on file  Tobacco Use   Smoking status: Former    Types: Cigarettes    Quit date: 05/24/2002    Years since quitting: 19.1   Smokeless tobacco: Never  Substance and Sexual Activity   Alcohol use: Not Currently   Drug use: Never   Sexual activity: Not Currently  Other Topics Concern   Not on file  Social History Narrative   Not on file   Social Determinants of Health   Financial Resource Strain: Not on file  Food Insecurity: Not on file  Transportation Needs: Not on file  Physical Activity: Not on file  Stress: Not on file  Social Connections: Not on file     Family History: The patient's family history includes Heart disease in his mother.  ROS:   All other ROS reviewed and negative. Pertinent positives noted in the HPI.     EKGs/Labs/Other Studies Reviewed:   The following studies were personally reviewed by me today:  TTE 10/01/2020  1. Very mild flattening of intrervenrticualr septum in systole suggest of  RV pressure overload. Much improved from previous. . Left ventricular  ejection fraction, by estimation, is 50 to 55%. The left ventricle has low  normal function. The left  ventricle has no regional wall motion abnormalities. Left ventricular  diastolic parameters are consistent with Grade I diastolic dysfunction  (impaired relaxation).   2. Right ventricular systolic function is normal. The right ventricular  size is mildly enlarged. There is normal pulmonary artery systolic  pressure.   3. The mitral valve is normal in structure. Trivial mitral valve  regurgitation. No evidence of mitral stenosis.   4. The aortic valve is normal in structure. There is mild calcification  of the aortic valve. Aortic valve regurgitation is not visualized. No  aortic stenosis is present.   5. The inferior vena cava is normal in size  with greater than 50%  respiratory variability, suggesting right atrial pressure of 3 mmHg.   Recent Labs: 09/01/2020: B Natriuretic Peptide 18.4 01/22/2021: BUN 14; Creatinine, Ser 1.03; Hemoglobin 14.6; Hemoglobin 14.6; Platelets 201; Potassium 3.8; Potassium 3.7; Sodium 139; Sodium 139   Recent Lipid Panel    Component Value Date/Time   CHOL 133 11/16/2008 0057   TRIG 120 11/16/2008 0057   HDL 41 11/16/2008 0057   CHOLHDL 3.2 Ratio 11/16/2008 0057   VLDL 24 11/16/2008 0057   LDLCALC 68 11/16/2008 0057    Physical Exam:   VS:  BP 114/74    Pulse 72    Ht 5\' 10"  (1.778 m)    Wt 182 lb 6.4 oz (82.7 kg)    SpO2 96%    BMI  26.17 kg/m    Wt Readings from Last 3 Encounters:  07/06/21 182 lb 6.4 oz (82.7 kg)  05/27/21 183 lb (83 kg)  01/22/21 188 lb 9.6 oz (85.5 kg)    General: Well nourished, well developed, in no acute distress Head: Atraumatic, normal size  Eyes: PEERLA, EOMI  Neck: Supple, no JVD Endocrine: No thryomegaly Cardiac: Normal S1, S2; RRR; no murmurs, rubs, or gallops Lungs: Clear to auscultation bilaterally, no wheezing, rhonchi or rales  Abd: Soft, nontender, no hepatomegaly  Ext: No edema, pulses 2+ Musculoskeletal: No deformities, BUE and BLE strength normal and equal Skin: Warm and dry, no rashes   Neuro: Alert and oriented to person, place, time, and situation, CNII-XII grossly intact, no focal deficits  Psych: Normal mood and affect   ASSESSMENT:   LINKON SIVERSON is a 71 y.o. male who presents for the following: 1. Pulmonary hypertension, unspecified ([place])   2. Chronic respiratory failure with hypoxia ([facility])   3. Coronary artery disease involving native coronary artery of native heart without angina pectoris   4. Mixed hyperlipidemia     PLAN:   1. Pulmonary hypertension, unspecified ([place]) 2. Chronic respiratory failure with hypoxia [facility]) -Mr. Zorn will has primary pulmonary arterial hypertension which is idiopathic.  All of his autoimmune or  rheumatologic work-up was negative.  PFTs show isolated decrease in DLCO.  No evidence of interstitial lung disease.  The working diagnosis is primary PAH. -Currently on triple therapy.  He will continue this.  Follows with advanced heart failure.  Still has mildly elevated pulmonary pressures.  No evidence of RV dysfunction as this has improved. -Remains on chronic oxygen therapy due to his underlying condition.  He will continue this. -Would recommend he continue to work with advanced heart failure.  She is condition advanced he may need to be evaluated at a primary pulmonary hypertension center.  Currently all of his signs are reassuring.  He has no evidence of congestive heart failure.  Pressures of all but normalized.  We will continue to follow with them.  3. Coronary artery disease involving native coronary artery of native heart without angina pectoris 4. Mixed hyperlipidemia -Nonobstructive CAD on heart cath.  Continue aspirin 81 mg daily.  On Crestor 5 mg daily.  Most recent LDL 44.  Disposition: Return in about 1 year (around 07/06/2022).  Medication Adjustments/Labs and Tests Ordered: Current medicines are reviewed at length with the patient today.  Concerns regarding medicines are outlined above.  No orders of the defined types were placed in this encounter.  No orders of the defined types were placed in this encounter.   Patient Instructions  Medication Instructions:  The current medical regimen is effective;  continue present plan and medications.  *If you need a refill on your cardiac medications before your next appointment, please call your pharmacy*   Follow-Up: At [facility], you and your health needs are our priority.  As part of our continuing mission to provide you with exceptional heart care, we have created designated Provider Care Teams.  These Care Teams include your primary Cardiologist (physician) and Advanced Practice Providers (APPs -  Physician Assistants  and Nurse Practitioners) who all work together to provide you with the care you need, when you need it.  We recommend signing up for the patient portal called "MyChart".  Sign up information is provided on this After Visit Summary.  MyChart is used to connect with patients for Virtual Visits (Telemedicine).  Patients are able to view  lab/test results, encounter notes, upcoming appointments, etc.  Non-urgent messages can be sent to your provider as well.   To learn more about what you can do with MyChart, go to [email or web address].    Your next appointment:   12 month(s)  The format for your next appointment:   In Person  Provider:   [name], MD       Time Spent with Patient: I have spent a total of 35 minutes with patient reviewing hospital notes, telemetry, EKGs, labs and examining the patient as well as establishing an assessment and plan that was discussed with the patient.  > 50% of time was spent in direct patient care.  Signed, Addison Naegeli. [name], MD, [facility]  [address] [place], [place] [address] [phone]  07/06/2021 4:56 PM

## 2021-07-06 ENCOUNTER — Other Ambulatory Visit: Payer: Self-pay

## 2021-07-06 ENCOUNTER — Ambulatory Visit (INDEPENDENT_AMBULATORY_CARE_PROVIDER_SITE_OTHER): Payer: Medicare Other | Admitting: Cardiovascular Disease

## 2021-07-06 ENCOUNTER — Encounter: Payer: Self-pay | Admitting: Cardiovascular Disease

## 2021-07-06 VITALS — BP 114/74 | HR 72 | Ht 70.0 in | Wt 182.4 lb

## 2021-07-06 DIAGNOSIS — J9611 Chronic respiratory failure with hypoxia: Secondary | ICD-10-CM | POA: Diagnosis not present

## 2021-07-06 DIAGNOSIS — I2510 Atherosclerotic heart disease of native coronary artery without angina pectoris: Secondary | ICD-10-CM | POA: Diagnosis not present

## 2021-07-06 DIAGNOSIS — E782 Mixed hyperlipidemia: Secondary | ICD-10-CM

## 2021-07-06 DIAGNOSIS — I272 Pulmonary hypertension, unspecified: Secondary | ICD-10-CM

## 2021-07-06 NOTE — Patient Instructions (Signed)
Medication Instructions:  °The current medical regimen is effective;  continue present plan and medications. ° °*If you need a refill on your cardiac medications before your next appointment, please call your pharmacy* ° ° °Follow-Up: °At [facility], you and your health needs are our priority.  As part of our continuing mission to provide you with exceptional heart care, we have created designated Provider Care Teams.  These Care Teams include your primary Cardiologist (physician) and Advanced Practice Providers (APPs -  Physician Assistants and Nurse Practitioners) who all work together to provide you with the care you need, when you need it. ° °We recommend signing up for the patient portal called "MyChart".  Sign up information is provided on this After Visit Summary.  MyChart is used to connect with patients for Virtual Visits (Telemedicine).  Patients are able to view lab/test results, encounter notes, upcoming appointments, etc.  Non-urgent messages can be sent to your provider as well.   °To learn more about what you can do with MyChart, go to [email or web address].   ° °Your next appointment:   °12 month(s) ° °The format for your next appointment:   °In Person ° °Provider:   °[place] T [name], MD   ° ° ° °

## 2021-07-07 ENCOUNTER — Encounter (HOSPITAL_COMMUNITY): Payer: Medicare Other | Admitting: Internal Medicine

## 2021-07-09 ENCOUNTER — Ambulatory Visit (HOSPITAL_COMMUNITY)
Admission: RE | Admit: 2021-07-09 | Discharge: 2021-07-09 | Disposition: A | Discharge status: Home / Self Care | Payer: Medicare Other | Source: Ambulatory Visit | Attending: Internal Medicine | Admitting: Internal Medicine

## 2021-07-09 ENCOUNTER — Encounter (HOSPITAL_COMMUNITY): Payer: Self-pay

## 2021-07-09 ENCOUNTER — Ambulatory Visit (HOSPITAL_COMMUNITY): Payer: Medicare Other

## 2021-07-09 ENCOUNTER — Other Ambulatory Visit: Payer: Self-pay

## 2021-07-09 ENCOUNTER — Encounter (HOSPITAL_COMMUNITY): Payer: Self-pay | Admitting: Internal Medicine

## 2021-07-09 VITALS — BP 128/80 | HR 76 | Wt 182.4 lb

## 2021-07-09 DIAGNOSIS — I2510 Atherosclerotic heart disease of native coronary artery without angina pectoris: Secondary | ICD-10-CM

## 2021-07-09 DIAGNOSIS — G8929 Other chronic pain: Secondary | ICD-10-CM

## 2021-07-09 DIAGNOSIS — R29898 Other symptoms and signs involving the musculoskeletal system: Secondary | ICD-10-CM

## 2021-07-09 DIAGNOSIS — I5032 Chronic diastolic (congestive) heart failure: Secondary | ICD-10-CM | POA: Insufficient documentation

## 2021-07-09 DIAGNOSIS — M25512 Pain in left shoulder: Secondary | ICD-10-CM | POA: Diagnosis not present

## 2021-07-09 DIAGNOSIS — M25612 Stiffness of left shoulder, not elsewhere classified: Secondary | ICD-10-CM

## 2021-07-09 DIAGNOSIS — I272 Pulmonary hypertension, unspecified: Secondary | ICD-10-CM | POA: Insufficient documentation

## 2021-07-09 DIAGNOSIS — G4733 Obstructive sleep apnea (adult) (pediatric): Secondary | ICD-10-CM

## 2021-07-09 LAB — COMPREHENSIVE METABOLIC PANEL
ALT: 27 U/L (ref 0–44)
AST: 25 U/L (ref 15–41)
Albumin: 4.2 g/dL (ref 3.5–5.0)
Alkaline Phosphatase: 50 U/L (ref 38–126)
Anion gap: 12 (ref 5–15)
BUN: 17 mg/dL (ref 8–23)
CO2: 23 mmol/L (ref 22–32)
Calcium: 9.5 mg/dL (ref 8.9–10.3)
Chloride: 104 mmol/L (ref 98–111)
Creatinine, Ser: 1.03 mg/dL (ref 0.61–1.24)
GFR, Estimated: >60 mL/min (ref >60)
Glucose, Bld: 94 mg/dL (ref 70–99)
Potassium: 3.8 mmol/L (ref 3.5–5.1)
Sodium: 139 mmol/L (ref 135–145)
Total Bilirubin: 0.7 mg/dL (ref 0.3–1.2)
Total Protein: 7.1 g/dL (ref 6.5–8.1)

## 2021-07-09 LAB — CBC
HCT: 45.1 % (ref 39.0–52.0)
Hemoglobin: 15.6 g/dL (ref 13.0–17.0)
MCH: 31.1 pg (ref 26.0–34.0)
MCHC: 34.6 g/dL (ref 30.0–36.0)
MCV: 89.8 fL (ref 80.0–100.0)
Platelets: 217 10*3/uL (ref 150–400)
RBC: 5.02 MIL/uL (ref 4.22–5.81)
RDW: 13.3 % (ref 11.5–15.5)
WBC: 8.3 10*3/uL (ref 4.0–10.5)
nRBC: 0 % (ref 0.0–0.2)

## 2021-07-09 LAB — BRAIN NATRIURETIC PEPTIDE: B Natriuretic Peptide: 17.9 pg/mL (ref 0.0–100.0)

## 2021-07-09 NOTE — Patient Instructions (Signed)
Labs done today, your results will be available in MyChart, we will contact you for abnormal readings.  Your physician has requested that you have an echocardiogram. Echocardiography is a painless test that uses sound waves to create images of your heart. It provides your doctor with information about the size and shape of your heart and how well your hearts chambers and valves are working. This procedure takes approximately one hour. There are no restrictions for this procedure.  Your physician recommends that you schedule a follow-up appointment in: 4 months  If you have any questions or concerns before your next appointment please send [place] a message through [place] or call our office at [phone].    TO LEAVE A MESSAGE FOR THE NURSE SELECT OPTION 2, PLEASE LEAVE A MESSAGE INCLUDING: YOUR NAME DATE OF BIRTH CALL BACK NUMBER REASON FOR CALL**this is important as we prioritize the call backs  YOU WILL RECEIVE A CALL BACK THE SAME DAY AS LONG AS YOU CALL BEFORE 4:00 PM  At the [facility], you and your health needs are our priority. As part of our continuing mission to provide you with exceptional heart care, we have created designated Provider Care Teams. These Care Teams include your primary Cardiologist (physician) and Advanced Practice Providers (APPs- Physician Assistants and Nurse Practitioners) who all work together to provide you with the care you need, when you need it.   You may see any of the following providers on your designated Care Team at your next follow up: Dr Glori Bickers Dr Haynes Kerns, NP Lyda Jester, [place] [facility] [place], [place] Audry Riles, PharmD   Please be sure to bring in all your medications bottles to every appointment.

## 2021-07-09 NOTE — Therapy (Signed)
[place], [place], [address] Phone: [phone]   Fax:  [phone]  Occupational Therapy Treatment  Patient Details  Name: Warren [name] MRN: [id number] Date of Birth: [birth year] Referring Provider (OT): Dr. Arther Abbott   Encounter Date: 07/09/2021   OT End of Session - 07/09/21 1350     Visit Number 2    Number of Visits 4    Date for OT Re-Evaluation 08/01/21    Authorization Type 1) Medicare A & B 2) [employer] MCR    Progress Note Due on Visit 10    OT Start Time 1119    OT Stop Time 1157    OT Time Calculation (min) 38 min    Activity Tolerance Patient tolerated treatment well    Behavior During Therapy WFL for tasks assessed/performed             Past Medical History:  Diagnosis Date   CHF (congestive heart failure) ([place])    Coronary artery disease    Hyperlipidemia    Hypertension     Past Surgical History:  Procedure Laterality Date   CARDIAC CATHETERIZATION     RIGHT HEART CATH N/A 01/22/2021   Procedure: RIGHT HEART CATH;  Surgeon: Jolaine Artist, MD;  Location: [place] [facility];  Service: Cardiovascular;  Laterality: N/A;   RIGHT/LEFT HEART CATH AND CORONARY ANGIOGRAPHY N/A 12/14/2019   Procedure: RIGHT/LEFT HEART CATH AND CORONARY ANGIOGRAPHY;  Surgeon: Jolaine Artist, MD;  Location: [place] [facility];  Service: Cardiovascular;  Laterality: N/A;    There were no vitals filed for this visit.   Subjective Assessment - 07/09/21 1120     Subjective  S: I'm just sore from the exercises.    Currently in Pain? Yes    Pain Score 3     Pain Location Shoulder    Pain Orientation Left    Pain Descriptors / Indicators Sore;Constant    Pain Type Chronic pain;Acute pain    Pain Onset 1 to 4 weeks ago    Pain Frequency Constant    Aggravating Factors  HEP    Pain Relieving Factors rest    Effect of Pain on Daily Activities no effect    Multiple Pain Sites No                 [facility] OT Assessment - 07/09/21 1124       Assessment   Medical Diagnosis left shoulder pain      Precautions   Precautions None                      OT Treatments/Exercises (OP) - 07/09/21 1140       Exercises   Exercises Shoulder      Shoulder Exercises: Supine   Protraction PROM;5 reps;AAROM;10 reps    Horizontal ABduction PROM;5 reps;AAROM;10 reps    External Rotation PROM;5 reps;AAROM;10 reps    Internal Rotation PROM;5 reps;AAROM;10 reps    Flexion PROM;5 reps;AAROM;10 reps    ABduction PROM;5 reps      Shoulder Exercises: Pulleys   Flexion 1 minute   standing   ABduction 1 minute   standing     Shoulder Exercises: ROM/Strengthening   Wall Wash 1'      Manual Therapy   Manual Therapy Myofascial release    Manual therapy comments Manual therapy completed prior to exercises.    Myofascial Release Myofascial release and manual stretching completed to  left upper arm, upper trapezius, and scapularis region to decrease fascial restrictions and increase joint mobility in a pain free zone.                      OT Short Term Goals - 07/09/21 1122       OT SHORT TERM GOAL #1   Title Pt will be provided with and educated on HEP to improve mobility required for ADL completion using LUE as non-dominant.    Time 4    Period Weeks    Status On-going    Target Date 08/01/21      OT SHORT TERM GOAL #2   Title Pt will decrease pain in LUE to 3/10 or less to improve ability to find a comfortable position for sleeping.    Time 4    Period Weeks    Status On-going      OT SHORT TERM GOAL #3   Title Pt will increase LUE A/ROM to [facility] to improve ability to reach overhead and behind back during dressing tasks.    Time 4    Period Weeks    Status On-going      OT SHORT TERM GOAL #4   Title Pt will decrease fascial restrictions to minimal amounts to improve mobility required for functional reaching tasks.    Time 4    Period Weeks     Status On-going      OT SHORT TERM GOAL #5   Title Pt will increase LUE strength to 4/5 or greater throughout to improve ability to perform light lifting tasks during ADLs.    Time 4    Period Weeks    Status On-going                      Plan - 07/09/21 1351     Clinical Impression Statement A: Initiated myofascial release to the left shoulder to address moderate fascial restrictions. Able to tolerate P/ROM to 50-60% of shoulder ranges. Completed AA/ROM while focusing on increasing ROM within pain tolerance. VC for form and technique were provided during session. Recommended use of ice or heat after session to assist with pain management.    Body Structure / Function / Physical Skills ADL;Endurance;Muscle spasms;UE functional use;Fascial restriction;Pain;ROM;IADL;Strength    Plan P: Continue with AA/ROM. Complete sitting/standing. Add PVC pipe slide.    Consulted and Agree with Plan of Care Patient             Patient will benefit from skilled therapeutic intervention in order to improve the following deficits and impairments:   Body Structure / Function / Physical Skills: ADL, Endurance, Muscle spasms, UE functional use, Fascial restriction, Pain, ROM, IADL, Strength       Visit Diagnosis: Stiffness of left shoulder, not elsewhere classified  Chronic left shoulder pain  Other symptoms and signs involving the musculoskeletal system    Problem List Patient Active Problem List   Diagnosis Date Noted   Cor pulmonale, chronic ([place]) 12/06/2019   COPD GOLD 1  10/30/2019   Chronic respiratory failure with hypoxia ([place]) 10/30/2019   Postinflammatory pulmonary fibrosis ([place]) 10/29/2019   DOE (dyspnea on exertion) 10/29/2019   Supplemental oxygen dependent 10/23/2019   Pain in joint of right hip 11/10/2018   Bowen disease 10/02/2018   GOUT, UNSPECIFIED 08/16/2008   DIABETES MELLITUS, TYPE II, CONTROLLED 10/05/2007   DERMATITIS, SEBORRHEIC 11/11/2006   FASCIITIS,  PLANTAR 08/30/2006   COLONIC POLYPS 08/29/2006   RENAL  CALCULUS 08/29/2006   HLD (hyperlipidemia) 07/26/2006   Essential hypertension 07/26/2006    Warren [name], OTR/L,CBIS  [phone]  07/09/2021, 1:53 PM  [facility] [address] [place], [place], [address] Phone: [phone]   Fax:  [phone]  Name: Warren [name] MRN: [id number] Date of Birth: 12-24-1950

## 2021-07-09 NOTE — Progress Notes (Signed)
6 Min Walk Test Completed  Pt ambulated 365.7 meters O2 Sat ranged 87%-97% on 6-8L oxygen HR ranged 77-122

## 2021-07-09 NOTE — Progress Notes (Signed)
Advanced [facility] Failure Clinic Note   Date:  07/09/2021   ID:  Warren [name], DOB 18-Sep-1950, MRN [id number]  Location: Home  Provider location: [facility] Type of Visit: Established patient  PCP:  [name], MD  Cardiologist:  [name], MD Primary HF: Warren [name]  Chief Complaint: Heart Failure follow-up   History of Present Illness:   Warren [name] is a 71 y.o. male with a hx of HTN, HLD, pulmonary hypertension referred by Dr. [name] for further management of Warren [name].    He has had a very complete w/u by Dr. [name]. He has had rather rapid onset of SOB associated with severe diffusion defect on PFTs with normal spirometry. CT negative for ILD. Echo showed normal EF with moderate to severe pHTN. VQ scan was negative.  His basic rheumatologic work-up was negative.     Underwent R/L cath 12/14/19 as below and started on Opsumit. At last visit sildenafil increased to 40 tid.   RHC 9/22  RA = 2 RV = 49/2 PA = 45/16 (28) PCW = 3 Fick cardiac output/index = 5.7/2.8 PVR = 4.4 WU Ao sat = 97% PA sat = 75%, 75%  -> Uptravi added  On Macitentan 10 daily, sildenafil 60 tid, selexipag 800 bid.   Here for routine f/u. On selexipag 800 bid x 2 months. Was on 1000 bid but cut back due to migraines (3 days in a row). Diarrhea and jaw claudication have resolved. Denies SOB, orthopnea or PND. Edema well controlled.   Echo 5/22 EF 50-55% RV normal Echo 05/01/20 EF 50-55% RV mildly dilated mild HK RVSP ~91mmHG  PAH therapy 1. Macitentan 10mg   Started 8/21 2. Sildenafil 60 tid Started 10/21  - 6MW 01/02/20 245m with sats down to 79% - 6MW 03/14/20 24m (stopped for 1 min due to drop in O2) - 6MW 09/01/20 416m O2 Sat ranged 80%-93%  on 3-8L oxygen     R/L cath 12/14/19   EF 55-60%   Prox RCA lesion is 20% stenosed. Mid LAD lesion is 40% stenosed.   Findings:   Ao = 120/71 (95) LV = 117/8 RA = 5 RV = 67/6 PA = 68/28 (44) PCW = 6 Fick  cardiac output/index = 4.9/2.4 PVR = 7.8 WU FA sat = 92% PA sat = 69%, 70%   Past Medical History:  Diagnosis Date   CHF (congestive heart failure) ([place])    Coronary artery disease    Hyperlipidemia    Hypertension    Past Surgical History:  Procedure Laterality Date   CARDIAC CATHETERIZATION     RIGHT HEART CATH N/A 01/22/2021   Procedure: RIGHT HEART CATH;  Surgeon: Jolaine Artist, MD;  Location: [facility];  Service: Cardiovascular;  Laterality: N/A;   RIGHT/LEFT HEART CATH AND CORONARY ANGIOGRAPHY N/A 12/14/2019   Procedure: RIGHT/LEFT HEART CATH AND CORONARY ANGIOGRAPHY;  Surgeon: Jolaine Artist, MD;  Location: [facility];  Service: Cardiovascular;  Laterality: N/A;     Current Outpatient Medications  Medication Sig Dispense Refill   acetaminophen (TYLENOL) 500 MG tablet Take 1,000 mg by mouth every 6 (six) hours as needed for moderate pain or headache.     aspirin EC 81 MG tablet Take 1 tablet (81 mg total) by mouth daily. 90 tablet 3   ezetimibe (ZETIA) 10 MG tablet Take 10 mg by mouth daily.     hydrochlorothiazide (HYDRODIURIL) 25 MG tablet Take 25 mg by  mouth daily.      macitentan (OPSUMIT) 10 MG tablet Take 1 tablet (10 mg total) by mouth daily. 30 tablet 11   OXYGEN Inhale 2-3 L into the lungs continuous. 2 L at rest and with activity 3 L     pantoprazole (PROTONIX) 40 MG tablet Take 40 mg by mouth daily.     potassium chloride (KLOR-CON) 10 MEQ tablet Take 2 tablets (20 mEq total) by mouth daily. PLEASE CALL OFFICE TO SCHEDULE FOLLOW UP APPOINTMENT 120 tablet 2   rosuvastatin (CRESTOR) 5 MG tablet TAKE 1 TABLET DAILY 90 tablet 0   Selexipag (UPTRAVI) 800 MCG TABS Take 1 tablet (800 mcg total) by mouth 2 (two) times daily. 60 tablet 3   sildenafil (REVATIO) 20 MG tablet Take 3 tablets (60 mg total) by mouth 3 (three) times daily. 270 tablet 11   traMADol (ULTRAM) 50 MG tablet Take 1 tablet (50 mg total) by mouth every 6 (six) hours as needed. 60  tablet 5   No current facility-administered medications for this encounter.   Facility-Administered Medications Ordered in Other Encounters  Medication Dose Route Frequency Provider Last Rate Last Admin   sodium chloride flush (NS) 0.9 % injection 10 mL  10 mL Intravenous PRN [name], Cassie Freer, MD   20 mL at 12/05/19 1125    Allergies:   Amlodipine besylate, Penicillins, and Valsartan   Social History:  The patient  reports that he quit smoking about 19 years ago. His smoking use included cigarettes. He has never used smokeless tobacco. He reports that he does not currently use alcohol. He reports that he does not use drugs.   Family History:  The patient's family history includes Heart disease in his mother.   ROS:  Please see the history of present illness.   All other systems are personally reviewed and negative.   Vitals:   07/09/21 1548  BP: 128/80  Pulse: 76  SpO2: 94%  Weight: 82.7 kg (182 lb 6.4 oz)    Exam:  General:  Well appearing. No resp difficulty on O2 HEENT: normal + flushing and telenjectacias on face and arms Neck: supple. no JVD. Carotids 2+ bilat; no bruits. No lymphadenopathy or thryomegaly appreciated. Cor: PMI nondisplaced. Regular rate & rhythm. No rubs, gallops or murmurs. Lungs: clear Abdomen: soft, nontender, nondistended. No hepatosplenomegaly. No bruits or masses. Good bowel sounds. Extremities: no cyanosis, clubbing, rash, edema + telenjectacias Neuro: alert & orientedx3, cranial nerves grossly intact. moves all 4 extremities w/o difficulty. Affect pleasant  Recent Labs: 09/01/2020: B Natriuretic Peptide 18.4 01/22/2021: BUN 14; Creatinine, Ser 1.03; Hemoglobin 14.6; Hemoglobin 14.6; Platelets 201; Potassium 3.8; Potassium 3.7; Sodium 139; Sodium 139  Personally reviewed   Wt Readings from Last 3 Encounters:  07/09/21 82.7 kg (182 lb 6.4 oz)  07/06/21 82.7 kg (182 lb 6.4 oz)  05/27/21 83 kg (183 lb)      ASSESSMENT AND PLAN:  1. Moderate  to severe pulmonary HTN - PFT 08/31/2019: no obstruction/restriction, severe diffusion defect (DCLO 25%) - Echo 9/20 LVEF normal. RV mild to moderate HK  - Echo 05/01/20 EF 50-55% RV mildly to moderately HK  - Echo 5/22 EF 50-55% RV normal - CT negative for ILD. ANA negative, anti-CCP negative, anti-RA negative - VQ scan negative CTEPH - Sleep study with mild PAH. Follows with Dr. Radford Pax - [place] as per HPI. Hemodynamics much improved with therapy.   - WHO Group I - likely IPAH - Improved NYHA II on triple therapy - macitentan  and sildenafil 60 tid and selexipag 800 bid - 6MW 01/02/20 269m with sats down to 79% - 6MW 03/14/20 248m (stopped for 1 min due to drop in O2) - 6MW 09/01/20 46m O2 Sat ranged 80%-93%  on 3-8L oxygen - 6MW today 07/09/21 341m O2 Sat ranged 87%-97% on 6-8L oxygen - Reveal lite score -2 (very low risk)  - Labs today. Needs f/u echo  2. Chronic respiratory failure due to [facility] - continue home O2. Follow sats with pulse ok and titrate to keep sats >= 90%  - no change   3.  CAD -Three-vessel calcification seen on recent lung CT - Minimal CAD by cath  - No s/s ischemia - Followed by Dr. [name]. Has been intolerant of statins.   4. OSA - Mild OSA 1/22: AHI 5.1/hr. Sats down to 77% - Followed by Dr. Radford Pax   Signed, Glori Bickers, MD  07/09/2021 4:06 PM  [facility] [place] [id number] Heart and [place] [id number] [phone] (office) [phone] (fax)

## 2021-07-14 ENCOUNTER — Other Ambulatory Visit: Payer: Self-pay

## 2021-07-14 ENCOUNTER — Ambulatory Visit (HOSPITAL_COMMUNITY): Payer: Medicare Other | Admitting: Occupational Therapy

## 2021-07-14 ENCOUNTER — Encounter (HOSPITAL_COMMUNITY): Payer: Self-pay | Admitting: Occupational Therapy

## 2021-07-14 DIAGNOSIS — M25612 Stiffness of left shoulder, not elsewhere classified: Secondary | ICD-10-CM

## 2021-07-14 DIAGNOSIS — M25512 Pain in left shoulder: Secondary | ICD-10-CM | POA: Diagnosis not present

## 2021-07-14 DIAGNOSIS — G8929 Other chronic pain: Secondary | ICD-10-CM

## 2021-07-14 DIAGNOSIS — R29898 Other symptoms and signs involving the musculoskeletal system: Secondary | ICD-10-CM | POA: Diagnosis not present

## 2021-07-14 NOTE — Therapy (Signed)
[place], [place], [address] Phone: [phone]   Fax:  [phone]  Occupational Therapy Treatment  Patient Details  Name: Warren [name] MRN: [id number] Date of Birth: September 20, 1950 Referring Provider (OT): Dr. Arther Abbott   Encounter Date: 07/14/2021   OT End of Session - 07/14/21 1221     Visit Number 3    Number of Visits 4    Date for OT Re-Evaluation 08/01/21    Authorization Type 1) Medicare A & B 2) [employer] MCR    Progress Note Due on Visit 10    OT Start Time 1116    OT Stop Time 1158    OT Time Calculation (min) 42 min    Activity Tolerance Patient tolerated treatment well    Behavior During Therapy WFL for tasks assessed/performed             Past Medical History:  Diagnosis Date   CHF (congestive heart failure) ([place])    Coronary artery disease    Hyperlipidemia    Hypertension     Past Surgical History:  Procedure Laterality Date   CARDIAC CATHETERIZATION     RIGHT HEART CATH N/A 01/22/2021   Procedure: RIGHT HEART CATH;  Surgeon: Jolaine Artist, MD;  Location: [facility];  Service: Cardiovascular;  Laterality: N/A;   RIGHT/LEFT HEART CATH AND CORONARY ANGIOGRAPHY N/A 12/14/2019   Procedure: RIGHT/LEFT HEART CATH AND CORONARY ANGIOGRAPHY;  Surgeon: Jolaine Artist, MD;  Location: [facility];  Service: Cardiovascular;  Laterality: N/A;    There were no vitals filed for this visit.   Subjective Assessment - 07/14/21 1115     Subjective  S: I went from 10 to 15 on my exercise repetitions.    Currently in Pain? No/denies                [facility] OT Assessment - 07/14/21 1114       Assessment   Medical Diagnosis left shoulder pain      Precautions   Precautions None                      OT Treatments/Exercises (OP) - 07/14/21 1119       Exercises   Exercises Shoulder      Shoulder Exercises: Supine   Protraction PROM;5  reps;AROM;10 reps    Horizontal ABduction PROM;5 reps;AROM;10 reps    External Rotation PROM;5 reps;AAROM;10 reps    Internal Rotation PROM;5 reps;AAROM;10 reps    Flexion PROM;5 reps;AROM;10 reps    ABduction PROM;5 reps;AROM;10 reps      Shoulder Exercises: Seated   Protraction AAROM;10 reps    Horizontal ABduction AROM;10 reps    External Rotation AAROM;10 reps    Internal Rotation AAROM;10 reps    Flexion AAROM;10 reps    Abduction AROM;10 reps      Shoulder Exercises: Standing   Extension Theraband;10 reps    Theraband Level (Shoulder Extension) Level 2 (Red)    Row Theraband;10 reps    Theraband Level (Shoulder Row) Level 2 (Red)    Retraction Theraband;10 reps    Theraband Level (Shoulder Retraction) Level 2 (Red)      Shoulder Exercises: ROM/Strengthening   Proximal Shoulder Strengthening, Seated 10X each, no rest breaks      Manual Therapy   Manual Therapy Myofascial release    Manual therapy comments Manual therapy completed prior to exercises.    Myofascial Release Myofascial  release and manual stretching completed to left upper arm, upper trapezius, and scapularis region to decrease fascial restrictions and increase joint mobility in a pain free zone.                    OT Education - 07/14/21 1147     Education Details A/ROM exercises; AA/ROM for er    Person(s) Educated Patient    Methods Explanation;Demonstration;Handout    Comprehension Verbalized understanding;Returned demonstration              OT Short Term Goals - 07/09/21 1122       OT SHORT TERM GOAL #1   Title Pt will be provided with and educated on HEP to improve mobility required for ADL completion using LUE as non-dominant.    Time 4    Period Weeks    Status On-going    Target Date 08/01/21      OT SHORT TERM GOAL #2   Title Pt will decrease pain in LUE to 3/10 or less to improve ability to find a comfortable position for sleeping.    Time 4    Period Weeks    Status  On-going      OT SHORT TERM GOAL #3   Title Pt will increase LUE A/ROM to [facility] to improve ability to reach overhead and behind back during dressing tasks.    Time 4    Period Weeks    Status On-going      OT SHORT TERM GOAL #4   Title Pt will decrease fascial restrictions to minimal amounts to improve mobility required for functional reaching tasks.    Time 4    Period Weeks    Status On-going      OT SHORT TERM GOAL #5   Title Pt will increase LUE strength to 4/5 or greater throughout to improve ability to perform light lifting tasks during ADLs.    Time 4    Period Weeks    Status On-going                      Plan - 07/14/21 1222     Clinical Impression Statement A: Continued with myofascial release to address fascial restrictions, P/ROM. Progressed to A/ROM in supine with exception of er, began A/ROM in sitting. Added red scapular theraband as well. Pt demonstrating ROM WFL both passive and active today, er continues to be limited. Verbal cuing for form and technique.    Body Structure / Function / Physical Skills ADL;Endurance;Muscle spasms;UE functional use;Fascial restriction;Pain;ROM;IADL;Strength    Plan P: Follow up on HEP, add er and IR shoulder stretches    OT Home Exercise Plan eval: AA/ROM exercises; 2/21: A/ROM    Consulted and Agree with Plan of Care Patient             Patient will benefit from skilled therapeutic intervention in order to improve the following deficits and impairments:   Body Structure / Function / Physical Skills: ADL, Endurance, Muscle spasms, UE functional use, Fascial restriction, Pain, ROM, IADL, Strength       Visit Diagnosis: Stiffness of left shoulder, not elsewhere classified  Chronic left shoulder pain  Other symptoms and signs involving the musculoskeletal system    Problem List Patient Active Problem List   Diagnosis Date Noted   Cor pulmonale, chronic ([place]) 12/06/2019   COPD GOLD 1  10/30/2019   Chronic  respiratory failure with hypoxia ([place]) 10/30/2019   Postinflammatory pulmonary fibrosis ([place])  10/29/2019   DOE (dyspnea on exertion) 10/29/2019   Supplemental oxygen dependent 10/23/2019   Pain in joint of right hip 11/10/2018   Bowen disease 10/02/2018   GOUT, UNSPECIFIED 08/16/2008   DIABETES MELLITUS, TYPE II, CONTROLLED 10/05/2007   DERMATITIS, SEBORRHEIC 11/11/2006   FASCIITIS, PLANTAR 08/30/2006   COLONIC POLYPS 08/29/2006   RENAL CALCULUS 08/29/2006   HLD (hyperlipidemia) 07/26/2006   Essential hypertension 07/26/2006    Guadelupe Sabin, OTR/L  [phone] 07/14/2021, 12:23 PM  [facility] [address] [place], [place], [address] Phone: [phone]   Fax:  [phone]  Name: DELMOS VELAQUEZ MRN: [id number] Date of Birth: 1950/05/30

## 2021-07-14 NOTE — Patient Instructions (Signed)

## 2021-07-20 DIAGNOSIS — Z125 Encounter for screening for malignant neoplasm of prostate: Secondary | ICD-10-CM | POA: Diagnosis not present

## 2021-07-20 DIAGNOSIS — R7303 Prediabetes: Secondary | ICD-10-CM | POA: Diagnosis not present

## 2021-07-20 DIAGNOSIS — E782 Mixed hyperlipidemia: Secondary | ICD-10-CM | POA: Diagnosis not present

## 2021-07-22 ENCOUNTER — Ambulatory Visit (HOSPITAL_COMMUNITY): Payer: Medicare Other | Attending: Orthopedic Surgery

## 2021-07-22 ENCOUNTER — Encounter (HOSPITAL_COMMUNITY): Payer: Self-pay

## 2021-07-22 ENCOUNTER — Other Ambulatory Visit: Payer: Self-pay

## 2021-07-22 DIAGNOSIS — M7022 Olecranon bursitis, left elbow: Secondary | ICD-10-CM | POA: Diagnosis not present

## 2021-07-22 DIAGNOSIS — R29898 Other symptoms and signs involving the musculoskeletal system: Secondary | ICD-10-CM | POA: Diagnosis not present

## 2021-07-22 DIAGNOSIS — G8929 Other chronic pain: Secondary | ICD-10-CM | POA: Insufficient documentation

## 2021-07-22 DIAGNOSIS — M25512 Pain in left shoulder: Secondary | ICD-10-CM

## 2021-07-22 DIAGNOSIS — J449 Chronic obstructive pulmonary disease, unspecified: Secondary | ICD-10-CM | POA: Diagnosis not present

## 2021-07-22 DIAGNOSIS — Z125 Encounter for screening for malignant neoplasm of prostate: Secondary | ICD-10-CM | POA: Diagnosis not present

## 2021-07-22 DIAGNOSIS — M25612 Stiffness of left shoulder, not elsewhere classified: Secondary | ICD-10-CM | POA: Insufficient documentation

## 2021-07-22 DIAGNOSIS — R809 Proteinuria, unspecified: Secondary | ICD-10-CM | POA: Diagnosis not present

## 2021-07-22 DIAGNOSIS — E782 Mixed hyperlipidemia: Secondary | ICD-10-CM | POA: Diagnosis not present

## 2021-07-22 DIAGNOSIS — J9611 Chronic respiratory failure with hypoxia: Secondary | ICD-10-CM | POA: Diagnosis not present

## 2021-07-22 DIAGNOSIS — R7303 Prediabetes: Secondary | ICD-10-CM | POA: Diagnosis not present

## 2021-07-22 DIAGNOSIS — G4733 Obstructive sleep apnea (adult) (pediatric): Secondary | ICD-10-CM | POA: Diagnosis not present

## 2021-07-22 DIAGNOSIS — I10 Essential (primary) hypertension: Secondary | ICD-10-CM | POA: Diagnosis not present

## 2021-07-22 DIAGNOSIS — K219 Gastro-esophageal reflux disease without esophagitis: Secondary | ICD-10-CM | POA: Diagnosis not present

## 2021-07-22 DIAGNOSIS — I270 Primary pulmonary hypertension: Secondary | ICD-10-CM | POA: Diagnosis not present

## 2021-07-22 NOTE — Therapy (Signed)
[facility] ?[address] ?[place], [place], [address] ?Phone: [phone]   Fax:  [phone] ? ?Occupational Therapy Treatment ? ?Patient Details  ?Name: Warren [name] ?MRN: [id number] ?Date of Birth: [birth year] ?Referring Provider (OT): Dr. Arther Abbott ? ? ?Encounter Date: 07/22/2021 ? ? OT End of Session - 07/22/21 1148   ? ? Visit Number 4   ? Number of Visits 5   ? Date for OT Re-Evaluation 08/01/21   ? Authorization Type 1) Medicare A & B 2) [employer] MCR   ? Progress Note Due on Visit 10   ? OT Start Time 0900   ? OT Stop Time [phone]   ? OT Time Calculation (min) 38 min   ? Activity Tolerance Patient tolerated treatment well   ? Behavior During Therapy [facility] for tasks assessed/performed   ? ?  ?  ? ?  ? ? ?Past Medical History:  ?Diagnosis Date  ? CHF (congestive heart failure) ([place])   ? Coronary artery disease   ? Hyperlipidemia   ? Hypertension   ? ? ?Past Surgical History:  ?Procedure Laterality Date  ? CARDIAC CATHETERIZATION    ? RIGHT HEART CATH N/A 01/22/2021  ? Procedure: RIGHT HEART CATH;  Surgeon: Jolaine Artist, MD;  Location: [place] [facility];  Service: Cardiovascular;  Laterality: N/A;  ? RIGHT/LEFT HEART CATH AND CORONARY ANGIOGRAPHY N/A 12/14/2019  ? Procedure: RIGHT/LEFT HEART CATH AND CORONARY ANGIOGRAPHY;  Surgeon: Jolaine Artist, MD;  Location: [place] [facility];  Service: Cardiovascular;  Laterality: N/A;  ? ? ?There were no vitals filed for this visit. ? ? Subjective Assessment - 07/22/21 0904   ? ? Subjective  S: This exercise is still one that gets me (abduction).   ? Currently in Pain? Yes   ? Pain Score 2    ? Pain Location Shoulder   ? Pain Orientation Left   ? Pain Descriptors / Indicators Sore   ? Pain Type Chronic pain   ? Pain Onset 1 to 4 weeks ago   ? Pain Frequency Intermittent   ? Aggravating Factors  movement, abduction   ? Pain Relieving Factors rest   ? Effect of Pain on Daily Activities no effect   ? Multiple Pain  Sites No   ? ?  ?  ? ?  ? ? ? ? ? [facility] OT Assessment - 07/22/21 1141   ? ?  ? Assessment  ? Medical Diagnosis left shoulder pain   ?  ? Precautions  ? Precautions None   ? ?  ?  ? ?  ? ? ? ? ? ? ? ? ? ? ? OT Treatments/Exercises (OP) - 07/22/21 1141   ? ?  ? Exercises  ? Exercises Shoulder   ?  ? Shoulder Exercises: Standing  ? Other Standing Exercises Star gazer dynamic stretch completed standing. 5X A/ROM then held position for 10 seconds.   ?  ? Manual Therapy  ? Manual Therapy Myofascial release;Other (comment)   ? Manual therapy comments Manual therapy completed prior to exercises.   ? Myofascial Release Myofascial release and manual stretching completed to left upper arm, upper trapezius, and scapularis region to decrease fascial restrictions and increase joint mobility in a pain free zone.   ? Other Manual Therapy Self myofascial release/trigger point release completed while sidelying on his left side with foam roller positioned horizontally at latissimus dorsi; left arm hanging off end of high low table.  While standing, patient using tennis ball at anterior deltoid to release trigger point in order to increase ROM.   ? ?  ?  ? ?  ? ? ? ? ? ? ? ? ? OT Education - 07/22/21 1146   ? ? Education Details self trigger point release using tennis ball to anterior deltoid. Standing or supine stargazer stretch   ? Person(s) Educated Patient   ? Methods Explanation;Demonstration;Handout;Verbal cues   ? Comprehension Verbalized understanding;Returned demonstration   ? ?  ?  ? ?  ? ? ? OT Short Term Goals - 07/09/21 1122   ? ?  ? OT SHORT TERM GOAL #1  ? Title Pt will be provided with and educated on HEP to improve mobility required for ADL completion using LUE as non-dominant.   ? Time 4   ? Period Weeks   ? Status On-going   ? Target Date 08/01/21   ?  ? OT SHORT TERM GOAL #2  ? Title Pt will decrease pain in LUE to 3/10 or less to improve ability to find a comfortable position for sleeping.   ? Time 4   ? Period Weeks    ? Status On-going   ?  ? OT SHORT TERM GOAL #3  ? Title Pt will increase LUE A/ROM to [facility] to improve ability to reach overhead and behind back during dressing tasks.   ? Time 4   ? Period Weeks   ? Status On-going   ?  ? OT SHORT TERM GOAL #4  ? Title Pt will decrease fascial restrictions to minimal amounts to improve mobility required for functional reaching tasks.   ? Time 4   ? Period Weeks   ? Status On-going   ?  ? OT SHORT TERM GOAL #5  ? Title Pt will increase LUE strength to 4/5 or greater throughout to improve ability to perform light lifting tasks during ADLs.   ? Time 4   ? Period Weeks   ? Status On-going   ? ?  ?  ? ?  ? ? ? ? ? ? ? ? ? ? ? Plan - 07/22/21 1149   ? ? Clinical Impression Statement A: Focused session on manual techniques to help release the fascial restrictions in the left anterior and posterior shoulder in order to increase patient's ability to complete internal and external rotation. Pt reports that when he first started therapy he had very limited movement. He is now able to get behind his head and his back. After trigger point release techniques using tennis ball and foam roller, patient reports that he is able to reach further behind his back. VC for form and technique were provided during session. Added stargazer stretch and self myofascial release to HEP.   ? Body Structure / Function / Physical Skills ADL;Endurance;Muscle spasms;UE functional use;Fascial restriction;Pain;ROM;IADL;Strength   ? Plan P: Reassessment. May be ready for discharge with HEP.   ? OT Home Exercise Plan eval: AA/ROM exercises; 2/21: A/ROM 3/1: stargazer stretch   ? Consulted and Agree with Plan of Care Patient   ? ?  ?  ? ?  ? ? ?Patient will benefit from skilled therapeutic intervention in order to improve the following deficits and impairments:   ?Body Structure / Function / Physical Skills: ADL, Endurance, Muscle spasms, UE functional use, Fascial restriction, Pain, ROM, IADL, Strength ?  ?  ? ? ?Visit  Diagnosis: ?Stiffness of left shoulder, not elsewhere classified ? ?Chronic left shoulder pain ? ?  Other symptoms and signs involving the musculoskeletal system ? ? ? ?Problem List ?Patient Active Problem List  ? Diagnosis Date Noted  ? Cor pulmonale, chronic ([place]) 12/06/2019  ? COPD GOLD 1  10/30/2019  ? Chronic respiratory failure with hypoxia ([place]) 10/30/2019  ? Postinflammatory pulmonary fibrosis ([place]) 10/29/2019  ? DOE (dyspnea on exertion) 10/29/2019  ? Supplemental oxygen dependent 10/23/2019  ? Pain in joint of right hip 11/10/2018  ? Bowen disease 10/02/2018  ? GOUT, UNSPECIFIED 08/16/2008  ? DIABETES MELLITUS, TYPE II, CONTROLLED 10/05/2007  ? DERMATITIS, SEBORRHEIC 11/11/2006  ? [place], [place] 08/30/2006  ? COLONIC POLYPS 08/29/2006  ? RENAL CALCULUS 08/29/2006  ? HLD (hyperlipidemia) 07/26/2006  ? Essential hypertension 07/26/2006  ? ? ?[name], OTR/L,CBIS  ?[phone] ? ?07/22/2021, 11:53 AM ? ?[facility] ?[address] ?[place], [place], [address] ?Phone: [phone]   Fax:  [phone] ? ?Name: Warren [name] ?MRN: [id number] ?Date of Birth: Aug 31, 1950 ? ?

## 2021-07-22 NOTE — Patient Instructions (Signed)
External Rotator Cuff Stretch, Laying down or standing ? ? ? ?Lie supine, fingers clasped behind head, elbows close together. Pull elbows backward while pinching shoulder blades. Complete 5-10 times of opening and closing your elbows then on the last repletion hold for 10 seconds.  ?Copyright ? VHI. All rights reserved.  ? ? ?Use a tennis ball on the front of shoulder to release tender trigger points/muscle soreness.  ? ?

## 2021-07-24 ENCOUNTER — Ambulatory Visit (HOSPITAL_COMMUNITY)
Admission: RE | Admit: 2021-07-24 | Discharge: 2021-07-24 | Disposition: A | Discharge status: Home / Self Care | Payer: Medicare Other | Source: Ambulatory Visit | Attending: Internal Medicine | Admitting: Internal Medicine

## 2021-07-24 ENCOUNTER — Telehealth (INDEPENDENT_AMBULATORY_CARE_PROVIDER_SITE_OTHER): Payer: Medicare Other | Admitting: Cardiology

## 2021-07-24 ENCOUNTER — Other Ambulatory Visit: Payer: Self-pay

## 2021-07-24 ENCOUNTER — Encounter: Payer: Self-pay | Admitting: Cardiology

## 2021-07-24 VITALS — BP 120/65 | HR 78 | Ht 70.0 in | Wt 178.8 lb

## 2021-07-24 DIAGNOSIS — I2510 Atherosclerotic heart disease of native coronary artery without angina pectoris: Secondary | ICD-10-CM

## 2021-07-24 DIAGNOSIS — G4733 Obstructive sleep apnea (adult) (pediatric): Secondary | ICD-10-CM | POA: Diagnosis not present

## 2021-07-24 DIAGNOSIS — I340 Nonrheumatic mitral (valve) insufficiency: Secondary | ICD-10-CM | POA: Insufficient documentation

## 2021-07-24 DIAGNOSIS — I10 Essential (primary) hypertension: Secondary | ICD-10-CM

## 2021-07-24 DIAGNOSIS — I272 Pulmonary hypertension, unspecified: Secondary | ICD-10-CM | POA: Diagnosis not present

## 2021-07-24 DIAGNOSIS — J449 Chronic obstructive pulmonary disease, unspecified: Secondary | ICD-10-CM | POA: Insufficient documentation

## 2021-07-24 DIAGNOSIS — J302 Other seasonal allergic rhinitis: Secondary | ICD-10-CM

## 2021-07-24 DIAGNOSIS — E119 Type 2 diabetes mellitus without complications: Secondary | ICD-10-CM | POA: Diagnosis not present

## 2021-07-24 LAB — ECHOCARDIOGRAM COMPLETE
AR max vel: 3.17 cm2
AV Peak grad: 4.1 mmHg
Ao pk vel: 1.02 m/s
Area-P 1/2: 3.53 cm2
Height: 70 in
S' Lateral: 2.5 cm
Weight: 2860.8 oz

## 2021-07-24 NOTE — Addendum Note (Signed)
Addended by: Antonieta Iba on: 07/24/2021 10:29 AM ? ? Modules accepted: Orders ? ?

## 2021-07-24 NOTE — Progress Notes (Signed)
Echocardiogram ?2D Echocardiogram has been performed. ? ?Warren [name] ?07/24/2021, 2:26 PM ?

## 2021-07-24 NOTE — Patient Instructions (Signed)
Medication Instructions:  ?Your physician recommends that you continue on your current medications as directed. Please refer to the Current Medication list given to you today. ? ?*If you need a refill on your cardiac medications before your next appointment, please call your pharmacy* ? ?Follow-Up: ?At [facility], you and your health needs are our priority.  As part of our continuing mission to provide you with exceptional heart care, we have created designated Provider Care Teams.  These Care Teams include your primary Cardiologist (physician) and Advanced Practice Providers (APPs -  Physician Assistants and Nurse Practitioners) who all work together to provide you with the care you need, when you need it. ? ?Your next appointment:   ?1 year(s) ? ?The format for your next appointment:   ?In Person ? ?Provider: ?Fransico Him, MD ? ? ?You have been referred to Dr. Carmelina Peal  ?

## 2021-07-24 NOTE — Progress Notes (Signed)
? ?Virtual Visit via Video Note  ? ?This visit type was conducted due to national recommendations for restrictions regarding the COVID-19 Pandemic (e.g. social distancing) in an effort to limit this patient's exposure and mitigate transmission in our community.  Due to his co-morbid illnesses, this patient is at least at moderate risk for complications without adequate follow up.  This format is felt to be most appropriate for this patient at this time.  All issues noted in this document were discussed and addressed.  A limited physical exam was performed with this format.  Please refer to the patient's chart for his consent to telehealth for [facility]. ? ? ?Date:  07/24/2021  ? ?ID:  KARANVEER RAMAKRISHNAN, DOB Jun 14, 1950, MRN [id number] ?The patient was identified using 2 identifiers. ? ?Patient Location: Home ?Provider Location: Home Office ? ? ?PCP:  [name], MD ?  ?[facility] Providers ?Cardiologist:  [name], MD    ? ?Evaluation Performed:  Follow-Up Visit ? ?Chief Complaint:  OSA ? ?History of Present Illness:   ? ?REMUS HAGEDORN is a 71 y.o. male with a hx of CHF, CAD, HTN and HLD who was referred for  Home sleep study with [facility] for pulmonary HTN.  He was found to have very mild OSA with an AHI of 5/hr but increased to 11.9/hr during REM sleep. He had nocturnal hypoxemia with O2 sats < 88% for 32 min.  He was initially titrated on CPAP but due to ongoing respiratory events was changed to BiPAP at 9/5cm H2O.   ? ?He is doing well with his CPAP device and thinks that he has gotten used to it.  He tolerates the mask and feels the pressure is adequate.  Since going on CPAP he feels rested in the am and has no significant daytime sleepiness.  He denies any significant mouth or nasal dryness.  He has had a lot of nasal congestion recently related to significant allergies.  He has tried nasal steroid spray that did not work and he has tried allergy meds that did not work.  He does not think that he  snores. He says that some times his nose will bleed when he sneezes too much.   ? ?The patient does not have symptoms concerning for COVID-19 infection (fever, chills, cough, or new shortness of breath).  ? ? ?Past Medical History:  ?Diagnosis Date  ? CHF (congestive heart failure) ([place])   ? Coronary artery disease   ? Hyperlipidemia   ? Hypertension   ? ?Past Surgical History:  ?Procedure Laterality Date  ? CARDIAC CATHETERIZATION    ? RIGHT HEART CATH N/A 01/22/2021  ? Procedure: RIGHT HEART CATH;  Surgeon: Jolaine Artist, MD;  Location: [place] [facility];  Service: Cardiovascular;  Laterality: N/A;  ? RIGHT/LEFT HEART CATH AND CORONARY ANGIOGRAPHY N/A 12/14/2019  ? Procedure: RIGHT/LEFT HEART CATH AND CORONARY ANGIOGRAPHY;  Surgeon: Jolaine Artist, MD;  Location: [place] [facility];  Service: Cardiovascular;  Laterality: N/A;  ?  ? ?Current Meds  ?Medication Sig  ? acetaminophen (TYLENOL) 500 MG tablet Take 1,000 mg by mouth every 6 (six) hours as needed for moderate pain or headache.  ? aspirin EC 81 MG tablet Take 1 tablet (81 mg total) by mouth daily.  ? ezetimibe (ZETIA) 10 MG tablet Take 10 mg by mouth daily.  ? hydrochlorothiazide (HYDRODIURIL) 25 MG tablet Take 25 mg by mouth daily.   ? macitentan (OPSUMIT) 10 MG tablet Take 1  tablet (10 mg total) by mouth daily.  ? OXYGEN Inhale 2-3 L into the lungs continuous. 2 L at rest and with activity 3 L  ? pantoprazole (PROTONIX) 40 MG tablet Take 40 mg by mouth daily.  ? potassium chloride (KLOR-CON) 10 MEQ tablet Take 2 tablets (20 mEq total) by mouth daily. PLEASE CALL OFFICE TO SCHEDULE FOLLOW UP APPOINTMENT  ? rosuvastatin (CRESTOR) 5 MG tablet TAKE 1 TABLET DAILY  ? Selexipag (UPTRAVI) 800 MCG TABS Take 1 tablet (800 mcg total) by mouth 2 (two) times daily.  ? sildenafil (REVATIO) 20 MG tablet Take 3 tablets (60 mg total) by mouth 3 (three) times daily.  ?  ? ?Allergies:   Amlodipine besylate, Penicillins, and Valsartan  ? ?Social History  ? ?Tobacco  Use  ? Smoking status: Former  ?  Types: Cigarettes  ?  Quit date: 05/24/2002  ?  Years since quitting: 19.1  ? Smokeless tobacco: Never  ?Substance Use Topics  ? Alcohol use: Not Currently  ? Drug use: Never  ?  ? ?Family Hx: ?The patient's family history includes Heart disease in his mother. ? ?ROS:   ?Please see the history of present illness.    ? ?All other systems reviewed and are negative. ? ? ?Prior CV studies:   ?The following studies were reviewed today: ? ?HST, CPAP titration, PAP download ? ?Labs/Other Tests and Data Reviewed:   ? ?EKG:  No ECG reviewed. ? ?Recent Labs: ?07/09/2021: ALT 27; B Natriuretic Peptide 17.9; BUN 17; Creatinine, Ser 1.03; Hemoglobin 15.6; Platelets 217; Potassium 3.8; Sodium 139  ? ?Recent Lipid Panel ?Lab Results  ?Component Value Date/Time  ? CHOL 133 11/16/2008 12:57 AM  ? TRIG 120 11/16/2008 12:57 AM  ? HDL 41 11/16/2008 12:57 AM  ? CHOLHDL 3.2 Ratio 11/16/2008 12:57 AM  ? LDLCALC 68 11/16/2008 12:57 AM  ? ? ?Wt Readings from Last 3 Encounters:  ?07/24/21 178 lb 12.8 oz (81.1 kg)  ?07/09/21 182 lb 6.4 oz (82.7 kg)  ?07/06/21 182 lb 6.4 oz (82.7 kg)  ?  ? ?Risk Assessment/Calculations:   ?  ? ?    ?Objective:   ? ?Vital Signs:  BP 120/65   Pulse 78   Ht 5\' 10"  (1.778 m)   Wt 178 lb 12.8 oz (81.1 kg)   SpO2 96%   BMI 25.66 kg/m?   ?Well nourished, well developed male in no acute distress. ?Well appearing, alert and conversant, regular work of breathing,  good skin color  ?Eyes- anicteric ?mouth- oral mucosa is pink  ?neuro- grossly intact ?skin- no apparent rash or lesions or cyanosis  ?ASSESSMENT & PLAN:   ? ? OSA - The patient is tolerating PAP therapy well without any problems. The PAP download performed by his DME was personally reviewed and interpreted by me today and showed an AHI of 2.1/hr on 9/5 cm H2O with 100% compliance in using more than 4 hours nightly.  The patient has been using and benefiting from PAP use and will continue to benefit from therapy.  ?  ?2.   HTN ?-BP well controlled ?-Continue prescription drug management with HCTZ 25mg  daily with PRN refills ? ?3.  Allergic Rhinitis ?-he has tried multiple antihistamines and nasal steroid sprays and is still having a lot of problems with allergy sx and nasal congestion that affect tolerance to his PAP device ?-I will refer to Allergy specialist ? ?COVID-19 Education: ?The signs and symptoms of COVID-19 were discussed with the patient and how to  seek care for testing (follow up with PCP or arrange E-visit).  The importance of social distancing was discussed today. ? ?Time:   ?Today, I have spent 15 minutes with the patient with telehealth technology discussing the above problems.   ? ? ?Medication Adjustments/Labs and Tests Ordered: ?Current medicines are reviewed at length with the patient today.  Concerns regarding medicines are outlined above.  ? ?Tests Ordered: ?No orders of the defined types were placed in this encounter. ? ? ?Medication Changes: ?No orders of the defined types were placed in this encounter. ? ? ?Follow Up:  Virtual Visit  1 year ? ?Signed, ?Fransico Him, MD  ?07/24/2021 10:23 AM    ?[place] ?

## 2021-07-29 ENCOUNTER — Ambulatory Visit (HOSPITAL_COMMUNITY): Payer: Medicare Other

## 2021-07-29 ENCOUNTER — Other Ambulatory Visit: Payer: Self-pay

## 2021-07-29 ENCOUNTER — Encounter (HOSPITAL_COMMUNITY): Payer: Self-pay

## 2021-07-29 DIAGNOSIS — G8929 Other chronic pain: Secondary | ICD-10-CM | POA: Diagnosis not present

## 2021-07-29 DIAGNOSIS — M25512 Pain in left shoulder: Secondary | ICD-10-CM | POA: Diagnosis not present

## 2021-07-29 DIAGNOSIS — M25612 Stiffness of left shoulder, not elsewhere classified: Secondary | ICD-10-CM | POA: Diagnosis not present

## 2021-07-29 DIAGNOSIS — R29898 Other symptoms and signs involving the musculoskeletal system: Secondary | ICD-10-CM

## 2021-07-29 NOTE — Therapy (Signed)
[facility] ?[address] ?[place], [place], [address] ?Phone: [phone]   Fax:  [phone] ? ?Occupational Therapy Treatment ? ?Patient Details  ?Name: Warren [name] ?MRN: [id number] ?Date of Birth: 1950/06/01 ?Referring Provider (OT): Dr. Arther Abbott ? ?AROM  ?Overall AROM Comments Assessed seated, er/IR adducted   ?AROM Assessment Site Shoulder   ?Right/Left Shoulder Left   ?Left Shoulder Flexion 165 Degrees   previous: 111  ?Left Shoulder ABduction 165 Degrees   previous: 120  ?Left Shoulder Internal Rotation 90 Degrees   previous: same  ?Left Shoulder External Rotation 60 Degrees   previous: 40  ?Strength  ?Overall Strength Comments Assessed seated, er/IR adducted   ?Strength Assessment Site Shoulder   ?Right/Left Shoulder Left   ?Left Shoulder Flexion 5/5   previous: 4-/5  ?Left Shoulder ABduction 5/5   previous: 4-/5  ?Left Shoulder Internal Rotation 5/5   previous: same  ?Left Shoulder External Rotation 5/5   previous: 4+/5  ? ?Encounter Date: 07/29/2021 ? ? OT End of Session - 07/29/21 0939   ? ? Visit Number 5   ? Number of Visits 5   ? Authorization Type 1) Medicare A & B 2) [employer] MCR   ? Progress Note Due on Visit 10   ? OT Start Time 0900   reassess/discharge  ? OT Stop Time [phone]   ? OT Time Calculation (min) 38 min   ? Activity Tolerance Patient tolerated treatment well   ? Behavior During Therapy [facility] for tasks assessed/performed   ? ?  ?  ? ?  ? ? ?Past Medical History:  ?Diagnosis Date  ? CHF (congestive heart failure) ([place])   ? Coronary artery disease   ? Hyperlipidemia   ? Hypertension   ? ? ?Past Surgical History:  ?Procedure Laterality Date  ? CARDIAC CATHETERIZATION    ? RIGHT HEART CATH N/A 01/22/2021  ? Procedure: RIGHT HEART CATH;  Surgeon: Jolaine Artist, MD;  Location: [place] [facility];  Service: Cardiovascular;  Laterality: N/A;  ? RIGHT/LEFT HEART CATH AND CORONARY ANGIOGRAPHY N/A 12/14/2019  ? Procedure: RIGHT/LEFT HEART CATH  AND CORONARY ANGIOGRAPHY;  Surgeon: Jolaine Artist, MD;  Location: [place] [facility];  Service: Cardiovascular;  Laterality: N/A;  ? ? ?There were no vitals filed for this visit. ? ? Subjective Assessment - 07/29/21 0905   ? ? Subjective  S: I can reach behind my back now which I couldn't do when I first started.   ? Currently in Pain? No/denies   ? ?  ?  ? ?  ? ? ? ? ? [place] OT Assessment - 07/29/21 0906   ? ?  ? Assessment  ? Medical Diagnosis left shoulder pain   ?  ? Precautions  ? Precautions None   ?  ? Observation/Other Assessments  ? Focus on Therapeutic Outcomes (FOTO)  83/100   ?  ? AROM  ? Overall AROM Comments Assessed seated, er/IR adducted   ? AROM Assessment Site Shoulder   ? Right/Left Shoulder Left   ? Left Shoulder Flexion 165 Degrees   previous: 111  ? Left Shoulder ABduction 165 Degrees   previous: 120  ? Left Shoulder Internal Rotation 90 Degrees   previous: same  ? Left Shoulder External Rotation 60 Degrees   previous: 40  ?  ? Strength  ? Overall Strength Comments Assessed seated, er/IR adducted   ? Strength Assessment Site Shoulder   ? Right/Left Shoulder Left   ?  Left Shoulder Flexion 5/5   previous: 4-/5  ? Left Shoulder ABduction 5/5   previous: 4-/5  ? Left Shoulder Internal Rotation 5/5   previous: same  ? Left Shoulder External Rotation 5/5   previous: 4+/5  ? ?  ?  ? ?  ? ? ? ? ? ? ? ? ? ? ? OT Treatments/Exercises (OP) - 07/29/21 8502   ? ?  ? Exercises  ? Exercises Shoulder   ?  ? Shoulder Exercises: Standing  ? Extension Theraband;10 reps   ? Theraband Level (Shoulder Extension) Level 2 (Red)   ? Row Theraband;10 reps   ? Theraband Level (Shoulder Row) Level 2 (Red)   ? Retraction Theraband;10 reps   ? Theraband Level (Shoulder Retraction) Level 2 (Red)   ? ?  ?  ? ?  ? ? ? ? ? ? ? ? ? OT Education - 07/29/21 0937   ? ? Education Details red band scapular strengthening   ? Person(s) Educated Patient   ? Methods Explanation;Demonstration;Handout;Verbal cues   ? Comprehension  Verbalized understanding;Returned demonstration   ? ?  ?  ? ?  ? ? ? OT Short Term Goals - 07/29/21 0914   ? ?  ? OT SHORT TERM GOAL #1  ? Title Pt will be provided with and educated on HEP to improve mobility required for ADL completion using LUE as non-dominant.   ? Time 4   ? Period Weeks   ? Status Achieved   ? Target Date 08/01/21   ?  ? OT SHORT TERM GOAL #2  ? Title Pt will decrease pain in LUE to 3/10 or less to improve ability to find a comfortable position for sleeping.   ? Time 4   ? Period Weeks   ? Status Achieved   ?  ? OT SHORT TERM GOAL #3  ? Title Pt will increase LUE A/ROM to [facility] to improve ability to reach overhead and behind back during dressing tasks.   ? Time 4   ? Period Weeks   ? Status Achieved   ?  ? OT SHORT TERM GOAL #4  ? Title Pt will decrease fascial restrictions to minimal amounts to improve mobility required for functional reaching tasks.   ? Time 4   ? Period Weeks   ? Status Achieved   ?  ? OT SHORT TERM GOAL #5  ? Title Pt will increase LUE strength to 4/5 or greater throughout to improve ability to perform light lifting tasks during ADLs.   ? Time 4   ? Period Weeks   ? Status Achieved   ? ?  ?  ? ?  ? ? ? ? ? ? ? ? ? ? ? Plan - 07/29/21 0939   ? ? Clinical Impression Statement A: Reassesment completed this date. patient has met all therapy goals. He is able to demonstrate functional A/ROM with external rotation reported as a little limited although no further range is achieved with stretching in clinic and at home. Shoulder strength is 5/5. HEP was reviewed and updated as needed. All education completed.   ? Body Structure / Function / Physical Skills ADL;Endurance;Muscle spasms;UE functional use;Fascial restriction;Pain;ROM;IADL;Strength   ? Plan P: D/C with HEP.   ? OT Home Exercise Plan eval: AA/ROM exercises; 2/21: A/ROM 3/1: stargazer stretch 3/8: red band scapular strengthening   ? Consulted and Agree with Plan of Care Patient   ? ?  ?  ? ?  ? ? ?Patient  will benefit from  skilled therapeutic intervention in order to improve the following deficits and impairments:   ?Body Structure / Function / Physical Skills: ADL, Endurance, Muscle spasms, UE functional use, Fascial restriction, Pain, ROM, IADL, Strength ?  ?  ? ? ?Visit Diagnosis: ?Stiffness of left shoulder, not elsewhere classified ? ?Other symptoms and signs involving the musculoskeletal system ? ?Chronic left shoulder pain ? ? ? ?Problem List ?Patient Active Problem List  ? Diagnosis Date Noted  ? Cor pulmonale, chronic ([place]) 12/06/2019  ? COPD GOLD 1  10/30/2019  ? Chronic respiratory failure with hypoxia ([place]) 10/30/2019  ? Postinflammatory pulmonary fibrosis ([place]) 10/29/2019  ? DOE (dyspnea on exertion) 10/29/2019  ? Supplemental oxygen dependent 10/23/2019  ? Pain in joint of right hip 11/10/2018  ? Bowen disease 10/02/2018  ? GOUT, UNSPECIFIED 08/16/2008  ? DIABETES MELLITUS, TYPE II, CONTROLLED 10/05/2007  ? DERMATITIS, SEBORRHEIC 11/11/2006  ? [place], [place] 08/30/2006  ? COLONIC POLYPS 08/29/2006  ? RENAL CALCULUS 08/29/2006  ? HLD (hyperlipidemia) 07/26/2006  ? Essential hypertension 07/26/2006  ? ?OCCUPATIONAL THERAPY DISCHARGE SUMMARY ? ?Visits from Start of Care: 5 ? ?Current functional level related to goals / functional outcomes: ?See above ?  ?Remaining deficits: ?See above ?  ?Education / Equipment: ?See above  ? ?Patient agrees to discharge. Patient goals were met. Patient is being discharged due to meeting the stated rehab goals.. ? ? ? ? ?[name], OTR/L,CBIS  ?[phone] ? ?07/29/2021, 9:41 AM ? ?[facility] ?[address] ?[place], [place], [address] ?Phone: [phone]   Fax:  [phone] ? ?Name: Warren [name] ?MRN: [id number] ?Date of Birth: Dec 20, 1950 ? ?

## 2021-07-29 NOTE — Patient Instructions (Signed)
1) (Home) Extension: Isometric / Bilateral Arm Retraction - Sitting ? ? ?Facing anchor, hold hands and elbow at shoulder height, with elbow bent.  Pull arms back to squeeze shoulder blades together. Repeat 10-15 times. 1 times/day.  ? ?2) (Clinic) Extension / Flexion (Assist) ? ? ?Face anchor, pull arms back, keeping elbow straight, and squeze shoulder blades together. ?Repeat 10-15 times. 1 times/day.  ? ?Copyright ? VHI. All rights reserved.  ? ?3) (Home) Retraction: Row - Bilateral (Anchor) ? ? ?Facing anchor, arms reaching forward, pull hands toward stomach, keeping elbows bent and at your sides and pinching shoulder blades together. ?Repeat 10-15 times. 1 times/day.  ? ?Copyright ? VHI. All rights reserved.   ?

## 2021-08-05 ENCOUNTER — Other Ambulatory Visit (HOSPITAL_COMMUNITY): Payer: Self-pay | Admitting: Internal Medicine

## 2021-08-16 ENCOUNTER — Other Ambulatory Visit (HOSPITAL_COMMUNITY): Payer: Self-pay | Admitting: Internal Medicine

## 2021-08-25 ENCOUNTER — Other Ambulatory Visit (HOSPITAL_COMMUNITY): Payer: Self-pay

## 2021-08-25 MED ORDER — UPTRAVI 800 MCG PO TABS: 800.0000 ug | ORAL_TABLET | Freq: Two times a day (BID) | ORAL | 11 refills | Status: DC

## 2021-09-14 ENCOUNTER — Ambulatory Visit (INDEPENDENT_AMBULATORY_CARE_PROVIDER_SITE_OTHER): Payer: Medicare Other | Admitting: Orthopedic Surgery

## 2021-09-14 DIAGNOSIS — M7552 Bursitis of left shoulder: Secondary | ICD-10-CM | POA: Diagnosis not present

## 2021-09-14 DIAGNOSIS — M25512 Pain in left shoulder: Secondary | ICD-10-CM

## 2021-09-14 DIAGNOSIS — I2510 Atherosclerotic heart disease of native coronary artery without angina pectoris: Secondary | ICD-10-CM

## 2021-09-14 NOTE — Patient Instructions (Signed)
Follow up as needed ?We are glad you're feeling better!!! ?

## 2021-09-14 NOTE — Progress Notes (Signed)
FOLLOW UP  ? ?Encounter Diagnoses  ?Name Primary?  ? Acute pain of left shoulder Yes  ? Bursitis of left shoulder   ? ? ? ?Chief Complaint  ?Patient presents with  ? Shoulder Pain  ?  Left- much better therapy has really helped   ? ? ? ?Warren [name] completed his therapy he says he has significantly improved with less pain.  Now he can sleep at night.  The only motion he did not get back was external rotation with his arm at his side that is still about 35 degrees ? ?His internal rotation is excellent his forward elevation and abduction are back to normal ? ?I released him follow-up with [place] as needed ?

## 2021-09-16 ENCOUNTER — Other Ambulatory Visit (HOSPITAL_COMMUNITY): Payer: Self-pay | Admitting: Internal Medicine

## 2021-09-16 MED ORDER — SILDENAFIL CITRATE 20 MG PO TABS: 60.0000 mg | ORAL_TABLET | Freq: Three times a day (TID) | ORAL | 11 refills | Status: DC

## 2021-09-23 ENCOUNTER — Other Ambulatory Visit: Payer: Self-pay | Admitting: Cardiovascular Disease

## 2021-11-06 ENCOUNTER — Encounter (HOSPITAL_COMMUNITY): Payer: Self-pay | Admitting: Internal Medicine

## 2021-11-06 ENCOUNTER — Ambulatory Visit (HOSPITAL_COMMUNITY)
Admission: RE | Admit: 2021-11-06 | Discharge: 2021-11-06 | Disposition: A | Discharge status: Home / Self Care | Payer: Medicare Other | Source: Ambulatory Visit | Attending: Internal Medicine | Admitting: Internal Medicine

## 2021-11-06 VITALS — BP 138/68 | HR 85 | Wt 177.2 lb

## 2021-11-06 DIAGNOSIS — I5032 Chronic diastolic (congestive) heart failure: Secondary | ICD-10-CM | POA: Diagnosis not present

## 2021-11-06 DIAGNOSIS — I110 Hypertensive heart disease with heart failure: Secondary | ICD-10-CM | POA: Insufficient documentation

## 2021-11-06 DIAGNOSIS — E785 Hyperlipidemia, unspecified: Secondary | ICD-10-CM | POA: Diagnosis not present

## 2021-11-06 DIAGNOSIS — R809 Proteinuria, unspecified: Secondary | ICD-10-CM | POA: Diagnosis not present

## 2021-11-06 DIAGNOSIS — Z79899 Other long term (current) drug therapy: Secondary | ICD-10-CM | POA: Diagnosis not present

## 2021-11-06 DIAGNOSIS — Z9981 Dependence on supplemental oxygen: Secondary | ICD-10-CM | POA: Insufficient documentation

## 2021-11-06 DIAGNOSIS — Z87891 Personal history of nicotine dependence: Secondary | ICD-10-CM | POA: Insufficient documentation

## 2021-11-06 DIAGNOSIS — I272 Pulmonary hypertension, unspecified: Secondary | ICD-10-CM

## 2021-11-06 DIAGNOSIS — J9610 Chronic respiratory failure, unspecified whether with hypoxia or hypercapnia: Secondary | ICD-10-CM | POA: Diagnosis not present

## 2021-11-06 DIAGNOSIS — I2781 Cor pulmonale (chronic): Secondary | ICD-10-CM

## 2021-11-06 DIAGNOSIS — G4733 Obstructive sleep apnea (adult) (pediatric): Secondary | ICD-10-CM | POA: Insufficient documentation

## 2021-11-06 DIAGNOSIS — Z8249 Family history of ischemic heart disease and other diseases of the circulatory system: Secondary | ICD-10-CM | POA: Diagnosis not present

## 2021-11-06 DIAGNOSIS — I2510 Atherosclerotic heart disease of native coronary artery without angina pectoris: Secondary | ICD-10-CM | POA: Diagnosis not present

## 2021-11-06 LAB — BRAIN NATRIURETIC PEPTIDE: B Natriuretic Peptide: 9.8 pg/mL (ref 0.0–100.0)

## 2021-11-06 LAB — BASIC METABOLIC PANEL
Anion gap: 11 (ref 5–15)
BUN: 14 mg/dL (ref 8–23)
CO2: 27 mmol/L (ref 22–32)
Calcium: 9.4 mg/dL (ref 8.9–10.3)
Chloride: 100 mmol/L (ref 98–111)
Creatinine, Ser: 0.97 mg/dL (ref 0.61–1.24)
GFR, Estimated: >60 mL/min (ref >60)
Glucose, Bld: 109 mg/dL — ABNORMAL HIGH (ref 70–99)
Potassium: 4.2 mmol/L (ref 3.5–5.1)
Sodium: 138 mmol/L (ref 135–145)

## 2021-11-06 MED ORDER — SPIRONOLACTONE 25 MG PO TABS: 12.5000 mg | ORAL_TABLET | Freq: Every day | ORAL | 0 refills | Status: DC

## 2021-11-06 NOTE — Progress Notes (Signed)
6 Min Walk Test Completed  Pt ambulated 1,700 = 518.16 meters O2 Sat ranged 80-88 on 6 L oxygen HR ranged 86-110

## 2021-11-06 NOTE — Patient Instructions (Signed)
Stop potassium  Stop Hydrochlorothiazide   Start Spironolactone 12.'5mg'$  (1/2 Tab) daily.  Labs done today, your results will be available in MyChart, we will contact you for abnormal readings.  Please get labs repeated at your family Doctor in 2 weeks.  Your physician recommends that you schedule a follow-up appointment in: 3 months.  If you have any questions or concerns before your next appointment please send [place] a message through [place] or call our office at [phone].    TO LEAVE A MESSAGE FOR THE NURSE SELECT OPTION 2, PLEASE LEAVE A MESSAGE INCLUDING: YOUR NAME DATE OF BIRTH CALL BACK NUMBER REASON FOR CALL**this is important as we prioritize the call backs  YOU WILL RECEIVE A CALL BACK THE SAME DAY AS LONG AS YOU CALL BEFORE 4:00 PM  At the [facility], you and your health needs are our priority. As part of our continuing mission to provide you with exceptional heart care, we have created designated Provider Care Teams. These Care Teams include your primary Cardiologist (physician) and Advanced Practice Providers (APPs- Physician Assistants and Nurse Practitioners) who all work together to provide you with the care you need, when you need it.   You may see any of the following providers on your designated Care Team at your next follow up: Dr Glori Bickers Dr Haynes Kerns, NP Lyda Jester, [place] [facility] [place], [place] Audry Riles, PharmD   Please be sure to bring in all your medications bottles to every appointment. \

## 2021-11-06 NOTE — Progress Notes (Signed)
Advanced Heart Failure Clinic Note   Date:  11/06/2021   ID:  ERASMUS BISTLINE, DOB 1950/07/24, MRN [id number]  Location: Home  Provider location: [facility] Type of Visit: Established patient  PCP:  [name], MD  Cardiologist:  [name], MD Primary HF: [place]  Chief Complaint: Heart Failure follow-up   History of Present Illness:   Warren [name] is a 71 y.o. male with a hx of HTN, HLD, pulmonary hypertension referred by Dr. [name] for further management of [place].    He has had a very complete w/u by Dr. [name]. He has had rather rapid onset of SOB associated with severe diffusion defect on PFTs with normal spirometry. CT negative for ILD. Echo showed normal EF with moderate to severe pHTN. VQ scan was negative.  His basic rheumatologic work-up was negative.     Underwent R/L cath 12/14/19 as below and started on Opsumit. At last visit sildenafil increased to 40 tid.   RHC 9/22  RA = 2 RV = 49/2 PA = 45/16 (28) PCW = 3 Fick cardiac output/index = 5.7/2.8 PVR = 4.4 WU Ao sat = 97% PA sat = 75%, 75% -> selex added  Now on Macitentan 10 daily, sildenafil 60 tid, selexipag 800 bid.   Here today for f/u. Has been doing well. Activity tolerance has been improving. No dyspnea as long as he wears O2 continuously. No orthopnea, PND or LE edema.  Echo 5/22 EF 50-55% RV normal Echo 05/01/20 EF 50-55% RV mildly dilated mild HK RVSP ~36mHG  PAH therapy 1. Macitentan '10mg'$   Started 8/21 2. Sildenafil 60 tid Started 10/21 3. Selexipag 800 bid  - 6MW 01/02/20 2574mith sats down to 79% - 6MW 03/14/20 29086mtopped for 1 min due to drop in O2) - 6MW 09/01/20 457m88mSat ranged 80%-93%  on 3-8L oxygen     R/L cath 12/14/19   EF 55-60%   Prox RCA lesion is 20% stenosed. Mid LAD lesion is 40% stenosed.   Findings:   Ao = 120/71 (95) LV = 117/8 RA = 5 RV = 67/6 PA = 68/28 (44) PCW = 6 Fick cardiac output/index = 4.9/2.4 PVR = 7.8  WU FA sat = 92% PA sat = 69%, 70%   Past Medical History:  Diagnosis Date   CHF (congestive heart failure) (HCC)[place] Coronary artery disease    Hyperlipidemia    Hypertension    Past Surgical History:  Procedure Laterality Date   CARDIAC CATHETERIZATION     RIGHT HEART CATH N/A 01/22/2021   Procedure: RIGHT HEART CATH;  Surgeon: BensJolaine Artist;  [facility];  Service: Cardiovascular;  Laterality: N/A;   RIGHT/LEFT HEART CATH AND CORONARY ANGIOGRAPHY N/A 12/14/2019   Procedure: RIGHT/LEFT HEART CATH AND CORONARY ANGIOGRAPHY;  Surgeon: BensJolaine Artist;  [facility];  Service: Cardiovascular;  Laterality: N/A;     Current Outpatient Medications  Medication Sig Dispense Refill   acetaminophen (TYLENOL) 500 MG tablet Take 1,000 mg by mouth every 6 (six) hours as needed for moderate pain or headache.     aspirin EC 81 MG tablet Take 1 tablet (81 mg total) by mouth daily. 90 tablet 3   ezetimibe (ZETIA) 10 MG tablet Take 10 mg by mouth daily.     macitentan (OPSUMIT) 10 MG tablet Take 1 tablet (10 mg total) by mouth daily. 30 tablet 11  OXYGEN Inhale 2-3 L into the lungs continuous. 2 L at rest and with activity 3 L     pantoprazole (PROTONIX) 40 MG tablet Take 40 mg by mouth daily.     rosuvastatin (CRESTOR) 5 MG tablet TAKE 1 TABLET DAILY 90 tablet 3   Selexipag (UPTRAVI) 800 MCG TABS Take 1 tablet (800 mcg total) by mouth 2 (two) times daily. 60 tablet 11   sildenafil (REVATIO) 20 MG tablet Take 3 tablets (60 mg total) by mouth 3 (three) times daily. 270 tablet 11   spironolactone (ALDACTONE) 25 MG tablet Take 0.5 tablets (12.5 mg total) by mouth daily. 25 tablet 0   No current facility-administered medications for this encounter.   Facility-Administered Medications Ordered in Other Encounters  Medication Dose Route Frequency Provider Last Rate Last Admin   sodium chloride flush (NS) 0.9 % injection 10 mL  10 mL Intravenous PRN  [name], Cassie Freer, MD   20 mL at 12/05/19 1125    Allergies:   Amlodipine besylate, Penicillins, and Valsartan   Social History:  The patient  reports that he quit smoking about 19 years ago. His smoking use included cigarettes. He has never used smokeless tobacco. He reports that he does not currently use alcohol. He reports that he does not use drugs.   Family History:  The patient's family history includes Heart disease in his mother.   ROS:  Please see the history of present illness.   All other systems are personally reviewed and negative.   Vitals:   11/06/21 1115  BP: 138/68  Pulse: 85  SpO2: 93%  Weight: 80.4 kg (177 lb 3.2 oz)     Exam:  General:  Ambulated into clinic. Appears well. HEENT: flushing + telangiectasias on face Neck: supple. no JVD. Carotids 2+ bilat; no bruits.  Cor: PMI nondisplaced. Regular rate & rhythm. No rubs, gallops or murmurs. Lungs: clear Abdomen: soft, nontender, nondistended.  Extremities: no cyanosis, clubbing, rash, edema, + telangiectasias on arms Neuro: alert & orientedx3, cranial nerves grossly intact. moves all 4 extremities w/o difficulty. Affect pleasant   Recent Labs: 07/09/2021: ALT 27; Hemoglobin 15.6; Platelets 217 11/06/2021: B Natriuretic Peptide 9.8; BUN 14; Creatinine, Ser 0.97; Potassium 4.2; Sodium 138  Personally reviewed   Wt Readings from Last 3 Encounters:  11/06/21 80.4 kg (177 lb 3.2 oz)  07/24/21 81.1 kg (178 lb 12.8 oz)  07/09/21 82.7 kg (182 lb 6.4 oz)      ASSESSMENT AND PLAN:  1. Moderate to severe pulmonary HTN - PFT 08/31/2019: no obstruction/restriction, severe diffusion defect (DCLO 25%) - Echo 9/20 LVEF normal. RV mild to moderate HK  - Echo 05/01/20 EF 50-55% RV mildly to moderately HK  - Echo 5/22 EF 50-55% RV normal - Echo 03/23 EF 60-65%, RV normal - CT negative for ILD. ANA negative, anti-CCP negative, anti-RA negative - VQ scan negative CTEPH - Sleep study with mild PAH. Follows with Dr.  Radford Pax - [place] as per HPI. Hemodynamics much improved with therapy.   - WHO Group I - likely IPAH - Improved NYHA II on triple therapy - macitentan and sildenafil 60 tid and selexipag 800 bid - 6MW 01/02/20 273mwith sats down to 79% - 6MW 03/14/20 2942mstopped for 1 min due to drop in O2) - 6MW 09/01/20 45753m Sat ranged 80%-93%  on 3-8L oxygen - 6MW 07/09/21 366m76mSat ranged 87%-97% on 6-8L oxygen - 6MW today 11/06/21 518 m )2 sat ranged 80-88% on 6L oxygen - Reveal  lite score -  2 (very low risk)  - Labs today  2. Chronic respiratory failure due to [facility] - continue home O2. Follow sats with pulse ok and titrate to keep sats >= 90%  - no change   3.  CAD -Three-vessel calcification seen on recent lung CT - Minimal CAD by cath  - No s/s ischemia - Followed by Dr. [name]. Has been intolerant of statins.   4. OSA - Mild OSA 1/22: AHI 5.1/hr. Sats down to 77% - Followed by Dr. Radford Pax   5. Proteinuria - Albumin/creatinine ratio 650 on labs at PCP's office in February - Stop HCTZ and potassium supplement - Start spironolactone 12.5 mg daily - BMET 2 weeks, Further management and workup per PCP  F/u: 3 months  Signed, Leata Mouse, PA-C  11/06/2021 4:10 PM  Advanced Heart Failure Clinic [facility] [place] [address] [phone] (office) [phone] (fax)  Patient seen and examined with the above-signed Advanced Practice Provider and/or Housestaff. I personally reviewed laboratory data, imaging studies and relevant notes. I independently examined the patient and formulated the important aspects of the plan. I have edited the note to reflect any of my changes or salient points. I have personally discussed the plan with the patient and/or family.  Doing well on triple therapy. Volume status looks good. NYHA I. Reveal lite risk is low . UACR elevated  RV normal on echo  General:  Well appearing.On o2 No resp  difficulty HEENT: normal Neck: supple. no JVD. Carotids 2+ bilat; no bruits. No lymphadenopathy or thryomegaly appreciated. Cor: PMI nondisplaced. Regular rate & rhythm. No rubs, gallops or murmurs. Lungs: clear but decreased throughout  Abdomen: soft, nontender, nondistended. No hepatosplenomegaly. No bruits or masses. Good bowel sounds. Extremities: no cyanosis, clubbing, rash, edema Neuro: alert & orientedx3, cranial nerves grossly intact. moves all 4 extremities w/o difficulty. Affect pleasant  Doing well. NYHA I. RV normal on echo. Reveal risk score low. Check 6MW and labs including BNP. Switch HCTZ to MRA with elevated UACR.   Glori Bickers, MD  11:35 PM

## 2021-11-20 DIAGNOSIS — J449 Chronic obstructive pulmonary disease, unspecified: Secondary | ICD-10-CM | POA: Diagnosis not present

## 2021-11-20 DIAGNOSIS — I2510 Atherosclerotic heart disease of native coronary artery without angina pectoris: Secondary | ICD-10-CM | POA: Diagnosis not present

## 2021-11-20 DIAGNOSIS — E782 Mixed hyperlipidemia: Secondary | ICD-10-CM | POA: Diagnosis not present

## 2021-11-20 DIAGNOSIS — I10 Essential (primary) hypertension: Secondary | ICD-10-CM | POA: Diagnosis not present

## 2021-11-23 ENCOUNTER — Other Ambulatory Visit (HOSPITAL_COMMUNITY)
Admission: RE | Admit: 2021-11-23 | Discharge: 2021-11-23 | Disposition: A | Discharge status: Home / Self Care | Payer: Medicare Other | Source: Ambulatory Visit | Attending: Internal Medicine | Admitting: Internal Medicine

## 2021-11-23 DIAGNOSIS — I5032 Chronic diastolic (congestive) heart failure: Secondary | ICD-10-CM | POA: Insufficient documentation

## 2021-11-23 LAB — BASIC METABOLIC PANEL
Anion gap: 4 — ABNORMAL LOW (ref 5–15)
BUN: 13 mg/dL (ref 8–23)
CO2: 25 mmol/L (ref 22–32)
Calcium: 9 mg/dL (ref 8.9–10.3)
Chloride: 110 mmol/L (ref 98–111)
Creatinine, Ser: 0.88 mg/dL (ref 0.61–1.24)
GFR, Estimated: >60 mL/min (ref >60)
Glucose, Bld: 99 mg/dL (ref 70–99)
Potassium: 4 mmol/L (ref 3.5–5.1)
Sodium: 139 mmol/L (ref 135–145)

## 2021-11-25 DIAGNOSIS — R809 Proteinuria, unspecified: Secondary | ICD-10-CM | POA: Diagnosis not present

## 2021-12-02 DIAGNOSIS — I270 Primary pulmonary hypertension: Secondary | ICD-10-CM | POA: Diagnosis not present

## 2021-12-02 DIAGNOSIS — R809 Proteinuria, unspecified: Secondary | ICD-10-CM | POA: Diagnosis not present

## 2021-12-05 ENCOUNTER — Other Ambulatory Visit (HOSPITAL_COMMUNITY): Payer: Self-pay | Admitting: Internal Medicine

## 2021-12-16 ENCOUNTER — Other Ambulatory Visit (HOSPITAL_COMMUNITY): Payer: Self-pay | Admitting: Internal Medicine

## 2021-12-16 MED ORDER — SPIRONOLACTONE 25 MG PO TABS: 12.5000 mg | ORAL_TABLET | Freq: Every day | ORAL | 3 refills | Status: DC

## 2022-01-14 DIAGNOSIS — H2513 Age-related nuclear cataract, bilateral: Secondary | ICD-10-CM | POA: Diagnosis not present

## 2022-01-14 DIAGNOSIS — H5203 Hypermetropia, bilateral: Secondary | ICD-10-CM | POA: Diagnosis not present

## 2022-01-21 DIAGNOSIS — R7303 Prediabetes: Secondary | ICD-10-CM | POA: Diagnosis not present

## 2022-01-21 DIAGNOSIS — E782 Mixed hyperlipidemia: Secondary | ICD-10-CM | POA: Diagnosis not present

## 2022-01-26 DIAGNOSIS — G4733 Obstructive sleep apnea (adult) (pediatric): Secondary | ICD-10-CM | POA: Diagnosis not present

## 2022-01-26 DIAGNOSIS — R809 Proteinuria, unspecified: Secondary | ICD-10-CM | POA: Diagnosis not present

## 2022-01-26 DIAGNOSIS — I270 Primary pulmonary hypertension: Secondary | ICD-10-CM | POA: Diagnosis not present

## 2022-01-26 DIAGNOSIS — Z0000 Encounter for general adult medical examination without abnormal findings: Secondary | ICD-10-CM | POA: Diagnosis not present

## 2022-01-26 DIAGNOSIS — Z125 Encounter for screening for malignant neoplasm of prostate: Secondary | ICD-10-CM | POA: Diagnosis not present

## 2022-01-26 DIAGNOSIS — M7022 Olecranon bursitis, left elbow: Secondary | ICD-10-CM | POA: Diagnosis not present

## 2022-01-26 DIAGNOSIS — E782 Mixed hyperlipidemia: Secondary | ICD-10-CM | POA: Diagnosis not present

## 2022-01-26 DIAGNOSIS — I10 Essential (primary) hypertension: Secondary | ICD-10-CM | POA: Diagnosis not present

## 2022-01-26 DIAGNOSIS — R7303 Prediabetes: Secondary | ICD-10-CM | POA: Diagnosis not present

## 2022-01-26 DIAGNOSIS — J449 Chronic obstructive pulmonary disease, unspecified: Secondary | ICD-10-CM | POA: Diagnosis not present

## 2022-01-26 DIAGNOSIS — J9611 Chronic respiratory failure with hypoxia: Secondary | ICD-10-CM | POA: Diagnosis not present

## 2022-01-26 DIAGNOSIS — K219 Gastro-esophageal reflux disease without esophagitis: Secondary | ICD-10-CM | POA: Diagnosis not present

## 2022-02-08 ENCOUNTER — Other Ambulatory Visit (HOSPITAL_COMMUNITY): Payer: Self-pay | Admitting: Internal Medicine

## 2022-02-15 DIAGNOSIS — Z23 Encounter for immunization: Secondary | ICD-10-CM | POA: Diagnosis not present

## 2022-02-19 ENCOUNTER — Ambulatory Visit (HOSPITAL_COMMUNITY)
Admission: RE | Admit: 2022-02-19 | Discharge: 2022-02-19 | Disposition: A | Discharge status: Home / Self Care | Payer: Medicare Other | Source: Ambulatory Visit | Attending: Internal Medicine | Admitting: Internal Medicine

## 2022-02-19 ENCOUNTER — Encounter (HOSPITAL_COMMUNITY): Payer: Self-pay | Admitting: Internal Medicine

## 2022-02-19 VITALS — BP 136/82 | HR 76 | Wt 178.4 lb

## 2022-02-19 DIAGNOSIS — I110 Hypertensive heart disease with heart failure: Secondary | ICD-10-CM | POA: Insufficient documentation

## 2022-02-19 DIAGNOSIS — I2510 Atherosclerotic heart disease of native coronary artery without angina pectoris: Secondary | ICD-10-CM | POA: Insufficient documentation

## 2022-02-19 DIAGNOSIS — R809 Proteinuria, unspecified: Secondary | ICD-10-CM | POA: Insufficient documentation

## 2022-02-19 DIAGNOSIS — E785 Hyperlipidemia, unspecified: Secondary | ICD-10-CM | POA: Diagnosis not present

## 2022-02-19 DIAGNOSIS — I5032 Chronic diastolic (congestive) heart failure: Secondary | ICD-10-CM | POA: Diagnosis not present

## 2022-02-19 DIAGNOSIS — J9610 Chronic respiratory failure, unspecified whether with hypoxia or hypercapnia: Secondary | ICD-10-CM | POA: Diagnosis not present

## 2022-02-19 DIAGNOSIS — I272 Pulmonary hypertension, unspecified: Secondary | ICD-10-CM | POA: Diagnosis not present

## 2022-02-19 DIAGNOSIS — Z8249 Family history of ischemic heart disease and other diseases of the circulatory system: Secondary | ICD-10-CM | POA: Diagnosis not present

## 2022-02-19 DIAGNOSIS — Z79899 Other long term (current) drug therapy: Secondary | ICD-10-CM | POA: Insufficient documentation

## 2022-02-19 DIAGNOSIS — Z87891 Personal history of nicotine dependence: Secondary | ICD-10-CM | POA: Insufficient documentation

## 2022-02-19 DIAGNOSIS — G4733 Obstructive sleep apnea (adult) (pediatric): Secondary | ICD-10-CM | POA: Diagnosis not present

## 2022-02-19 NOTE — Addendum Note (Signed)
Encounter addended by: Jerl Mina, RN on: 02/19/2022 2:46 PM  Actions taken: Order list changed, Diagnosis association updated, Clinical Note Signed

## 2022-02-19 NOTE — Patient Instructions (Signed)
There has been no changes to your medications.  Your physician has requested that you have an echocardiogram. Echocardiography is a painless test that uses sound waves to create images of your heart. It provides your doctor with information about the size and shape of your heart and how well your heart's chambers and valves are working. This procedure takes approximately one hour. There are no restrictions for this procedure.  Your physician recommends that you schedule a follow-up appointment in: 5 months ( February 2024)  ** please call the office in November to arrange your appointment **  If you have any questions or concerns before your next appointment please send [place] a message through [place] or call our office at [phone].    TO LEAVE A MESSAGE FOR THE NURSE SELECT OPTION 2, PLEASE LEAVE A MESSAGE INCLUDING: YOUR NAME DATE OF BIRTH CALL BACK NUMBER REASON FOR CALL**this is important as we prioritize the call backs  YOU WILL RECEIVE A CALL BACK THE SAME DAY AS LONG AS YOU CALL BEFORE 4:00 PM  At the [facility], you and your health needs are our priority. As part of our continuing mission to provide you with exceptional heart care, we have created designated Provider Care Teams. These Care Teams include your primary Cardiologist (physician) and Advanced Practice Providers (APPs- Physician Assistants and Nurse Practitioners) who all work together to provide you with the care you need, when you need it.   You may see any of the following providers on your designated Care Team at your next follow up: Dr Glori Bickers Dr Loralie Champagne Dr. Roxana Hires, NP Lyda Jester, [place] [facility] [place], [place] Forestine Na, NP Audry Riles, PharmD   Please be sure to bring in all your medications bottles to every appointment.

## 2022-02-19 NOTE — Progress Notes (Signed)
Advanced Heart Failure Clinic Note   Date:  02/19/2022   ID:  Warren [name], DOB July 27, 1950, MRN [id number]  Location: Home  Provider location: [facility] Type of Visit: Established patient  PCP:  Warren Squibb, MD  Cardiologist:  Warren Field, MD Primary HF: Warren [name]  Chief Complaint: Heart Failure follow-up   History of Present Illness:   Warren [name] is a 71 y.o. male with a hx of HTN, HLD, pulmonary hypertension referred by Warren [name] for further management of [place].    He has had a very complete w/u by Warren [name]. He has had rather rapid onset of SOB associated with severe diffusion defect on PFTs with normal spirometry. CT negative for ILD. Echo showed normal EF with moderate to severe pHTN. VQ scan was negative.  His basic rheumatologic work-up was negative.     Underwent R/L cath 12/14/19 as below and started on Opsumit. At last visit sildenafil increased to 40 tid.   RHC 9/22  RA = 2 RV = 49/2 PA = 45/16 (28) PCW = 3 Fick cardiac output/index = 5.7/2.8 PVR = 4.4 WU Ao sat = 97% PA sat = 75%, 75% -> selex added  Now on Macitentan 10 daily, sildenafil 60 tid, selexipag 800 bid.   Here today for f/u with his wife. Feels good. Wearing O2. No edema, orthopnea or PND. Does all ADLs without problem.  Mild dyspnea if goes too fast.   Echo 3/23 EF 60-65% RV normal TR inadequate to assess RVSP  Echo 5/22 EF 50-55% RV normal Echo 05/01/20 EF 50-55% RV mildly dilated mild HK RVSP ~66mHG    PAH therapy 1. Macitentan '10mg'$   Started 8/21 2. Sildenafil 60 tid Started 10/21 3. Selexipag 800 bid  - 6MW 01/02/20 2586mith sats down to 79% - 6MW 03/14/20 29076mtopped for 1 min due to drop in O2) - 6MW 09/01/20 457m3mSat ranged 80%-93%  on 3-8L oxygen     R/L cath 12/14/19   EF 55-60%   Prox RCA lesion is 20% stenosed. Mid LAD lesion is 40% stenosed.   Findings:   Ao = 120/71 (95) LV = 117/8 RA = 5 RV = 67/6 PA = 68/28  (44) PCW = 6 Fick cardiac output/index = 4.9/2.4 PVR = 7.8 WU FA sat = 92% PA sat = 69%, 70%   Past Medical History:  Diagnosis Date   CHF (congestive heart failure) (HCC)[place] Coronary artery disease    Hyperlipidemia    Hypertension    Past Surgical History:  Procedure Laterality Date   CARDIAC CATHETERIZATION     RIGHT HEART CATH N/A 01/22/2021   Procedure: RIGHT HEART CATH;  Surgeon: Warren [name];  [facility];  Service: Cardiovascular;  Laterality: N/A;   RIGHT/LEFT HEART CATH AND CORONARY ANGIOGRAPHY N/A 12/14/2019   Procedure: RIGHT/LEFT HEART CATH AND CORONARY ANGIOGRAPHY;  Surgeon: Warren [name];  [facility];  Service: Cardiovascular;  Laterality: N/A;     Current Outpatient Medications  Medication Sig Dispense Refill   acetaminophen (TYLENOL) 500 MG tablet Take 1,000 mg by mouth every 6 (six) hours as needed for moderate pain or headache.     aspirin EC 81 MG tablet Take 1 tablet (81 mg total) by mouth daily. 90 tablet 3   ezetimibe (ZETIA) 10 MG tablet Take 10 mg by mouth daily.     OPSUMIT 10 MG  tablet TAKE 1 TABLET DAILY. 30 tablet 11   OXYGEN Inhale 2-3 L into the lungs continuous. 2 L at rest and with activity 3 L     pantoprazole (PROTONIX) 40 MG tablet Take 40 mg by mouth daily.     rosuvastatin (CRESTOR) 5 MG tablet TAKE 1 TABLET DAILY 90 tablet 3   Selexipag (UPTRAVI) 800 MCG TABS Take 1 tablet (800 mcg total) by mouth 2 (two) times daily. 60 tablet 11   sildenafil (REVATIO) 20 MG tablet Take 3 tablets (60 mg total) by mouth 3 (three) times daily. 270 tablet 11   spironolactone (ALDACTONE) 25 MG tablet Take 0.5 tablets (12.5 mg total) by mouth daily. 45 tablet 3   No current facility-administered medications for this encounter.   Facility-Administered Medications Ordered in Other Encounters  Medication Dose Route Frequency Provider Last Rate Last Admin   sodium chloride flush (NS) 0.9 % injection 10 mL  10  mL Intravenous PRN [name], Warren Freer, MD   20 mL at 12/05/19 1125    Allergies:   Amlodipine besylate, Penicillins, and Valsartan   Social History:  The patient  reports that he quit smoking about 19 years ago. His smoking use included cigarettes. He has never used smokeless tobacco. He reports that he does not currently use alcohol. He reports that he does not use drugs.   Family History:  The patient's family history includes Heart disease in his mother.   ROS:  Please see the history of present illness.   All other systems are personally reviewed and negative.   Vitals:   02/19/22 1337  BP: 136/82  Pulse: 76  SpO2: 93%  Weight: 80.9 kg (178 lb 6.4 oz)     Exam:  General:  Ambulated into clinic. Appears well. HEENT: flushing + telangiectasias on face Neck: supple. no JVD. Carotids 2+ bilat; no bruits. No lymphadenopathy or thryomegaly appreciated. Cor: PMI nondisplaced. Regular rate & rhythm. No rubs, gallops or murmurs. Lungs: clear Abdomen: soft, nontender, nondistended. No hepatosplenomegaly. No bruits or masses. Good bowel sounds. Extremities: no cyanosis, clubbing, rash, edema Neuro: alert & orientedx3, cranial nerves grossly intact. moves all 4 extremities w/o difficulty. Affect pleasant    Recent Labs: 07/09/2021: ALT 27; Hemoglobin 15.6; Platelets 217 11/06/2021: B Natriuretic Peptide 9.8 11/23/2021: BUN 13; Creatinine, Ser 0.88; Potassium 4.0; Sodium 139  Personally reviewed   Wt Readings from Last 3 Encounters:  02/19/22 80.9 kg (178 lb 6.4 oz)  11/06/21 80.4 kg (177 lb 3.2 oz)  07/24/21 81.1 kg (178 lb 12.8 oz)      ASSESSMENT AND PLAN:  1. Moderate to severe pulmonary HTN - PFT 08/31/2019: no obstruction/restriction, severe diffusion defect (DCLO 25%) - Echo 9/20 LVEF normal. RV mild to moderate HK  - Echo 05/01/20 EF 50-55% RV mildly to moderately HK  - Echo 5/22 EF 50-55% RV normal - Echo 03/23 EF 60-65%, RV normal - CT negative for ILD. ANA negative,  anti-CCP negative, anti-RA negative - VQ scan negative CTEPH - Sleep study with mild PAH. Follows with Dr. Radford [name] - [place] as per HPI. Hemodynamics much improved with therapy.   - WHO Group I - likely IPAH - Improved NYHA II on triple therapy - macitentan and sildenafil 60 tid and selexipag 800 bid - Stable NYHA II - 6MW 01/02/20 260mwith sats down to 79% - 6MW 03/14/20 2957mstopped for 1 min due to drop in O2) - 6MW 09/01/20 45746m Sat ranged 80%-93%  on 3-8L oxygen - 6MW  07/09/21 325mO2 Sat ranged 87%-97% on 6-8L oxygen - 6MW 11/06/21 518 m O2 sat ranged 80-88% on 6L oxygen - Reveal lite score 3 = low risk  - Labs today  [facility]. [facility] continue to wear O2. Follow sats with pulse ok and titrate to keep sats >= 90%  - no change   3.  CAD -Three-vessel calcification seen on recent lung CT - Minimal CAD by cath  - No s/s angina - Followed by Dr. OAudie [name] Has been intolerant of statins.   4. OSA - Mild OSA 1/22: AHI 5.1/hr. Sats down to 77% - Followed by Dr. TRadford [name] 5. Proteinuria - Albumin/creatinine ratio 650 on labs at PCP's office in February now down to 29 - Continue spironolactone 12.5 mg daily   Signed, DGlori Bickers MD  02/19/2022 2:26 PM  Advanced Heart Failure [id number]Heart and [phone](office) [phone](fax)

## 2022-02-24 DIAGNOSIS — Z23 Encounter for immunization: Secondary | ICD-10-CM | POA: Diagnosis not present

## 2022-03-30 DIAGNOSIS — Z961 Presence of intraocular lens: Secondary | ICD-10-CM | POA: Diagnosis not present

## 2022-03-30 DIAGNOSIS — H2511 Age-related nuclear cataract, right eye: Secondary | ICD-10-CM | POA: Diagnosis not present

## 2022-03-30 DIAGNOSIS — H269 Unspecified cataract: Secondary | ICD-10-CM | POA: Diagnosis not present

## 2022-05-07 ENCOUNTER — Telehealth (HOSPITAL_COMMUNITY): Payer: Self-pay | Admitting: Pharmacist

## 2022-05-07 NOTE — Telephone Encounter (Signed)
Patient Advocate Encounter   Received notification from [facility] that prior authorization for Opsmit is required. Tried to submit PA via CoverMyMeds and received the following message:   The patient currently has access to the requested medication and a Prior Authorization is not needed for the patient/medication.   Audry Riles, PharmD, BCPS, BCCP, CPP Heart Failure Clinic Pharmacist [phone]

## 2022-05-07 NOTE — Telephone Encounter (Signed)
Patient Advocate Encounter   Received notification from [facility] that prior authorization for Warren [name] is required.   PA submitted on CoverMyMeds Key [id number] Status is pending   Will continue to follow.   Audry Riles, PharmD, BCPS, BCCP, CPP Heart Failure Clinic Pharmacist [phone]

## 2022-05-07 NOTE — Telephone Encounter (Signed)
Advanced Heart Failure Patient Advocate Encounter  Prior Authorization for Warren [name] has been approved.    Effective dates: 05/24/22 through 05/24/23  Audry Riles, PharmD, BCPS, BCCP, CPP Heart Failure Clinic Pharmacist [phone]

## 2022-05-18 ENCOUNTER — Other Ambulatory Visit (HOSPITAL_COMMUNITY): Payer: Self-pay

## 2022-05-18 ENCOUNTER — Telehealth (HOSPITAL_COMMUNITY): Payer: Self-pay | Admitting: Pharmacist

## 2022-05-18 DIAGNOSIS — H269 Unspecified cataract: Secondary | ICD-10-CM | POA: Diagnosis not present

## 2022-05-18 DIAGNOSIS — H2511 Age-related nuclear cataract, right eye: Secondary | ICD-10-CM | POA: Diagnosis not present

## 2022-05-18 DIAGNOSIS — H2512 Age-related nuclear cataract, left eye: Secondary | ICD-10-CM | POA: Diagnosis not present

## 2022-05-18 NOTE — Telephone Encounter (Signed)
Patient Advocate Encounter   Received notification from [facility] that prior authorization for sildenafil is required.   PA submitted on CoverMyMeds Key [id number] Status is pending   Will continue to follow.   Audry Riles, PharmD, BCPS, BCCP, CPP Heart Failure Clinic Pharmacist [phone]

## 2022-05-18 NOTE — Telephone Encounter (Signed)
Advanced Heart Failure Patient Advocate Encounter  Prior Authorization for sildenafil has been approved.    Effective dates: 05/24/22 through 05/24/23  Audry Riles, PharmD, BCPS, BCCP, CPP Heart Failure Clinic Pharmacist [phone]

## 2022-07-22 ENCOUNTER — Encounter: Payer: Self-pay | Admitting: Radiology

## 2022-07-22 ENCOUNTER — Telehealth: Payer: Self-pay | Admitting: *Deleted

## 2022-07-22 ENCOUNTER — Encounter (HOSPITAL_BASED_OUTPATIENT_CLINIC_OR_DEPARTMENT_OTHER): Payer: Self-pay | Admitting: Cardiology

## 2022-07-22 ENCOUNTER — Telehealth: Payer: Self-pay | Admitting: Cardiology

## 2022-07-22 DIAGNOSIS — G4733 Obstructive sleep apnea (adult) (pediatric): Secondary | ICD-10-CM

## 2022-07-22 DIAGNOSIS — Z125 Encounter for screening for malignant neoplasm of prostate: Secondary | ICD-10-CM | POA: Diagnosis not present

## 2022-07-22 DIAGNOSIS — R7303 Prediabetes: Secondary | ICD-10-CM | POA: Diagnosis not present

## 2022-07-22 DIAGNOSIS — E782 Mixed hyperlipidemia: Secondary | ICD-10-CM | POA: Diagnosis not present

## 2022-07-22 NOTE — Telephone Encounter (Signed)
Pt states he needs an updated prescription for his Bipap,  He states his prescription expired on 07/13/22 and he is unable to get any new supplies

## 2022-07-22 NOTE — Telephone Encounter (Signed)
My provider, [employer], needs a renewal of my Rx so I can get supplies for my bipap. The current Rx ran out Feb 20. I need to get a new cushion and filters for my bipap. Please send to Attn: Glenice Bow at [facility].

## 2022-07-22 NOTE — Telephone Encounter (Signed)
Order faxed to [facility].  

## 2022-07-25 ENCOUNTER — Other Ambulatory Visit (HOSPITAL_COMMUNITY): Payer: Self-pay | Admitting: Internal Medicine

## 2022-07-27 DIAGNOSIS — J9611 Chronic respiratory failure with hypoxia: Secondary | ICD-10-CM | POA: Diagnosis not present

## 2022-07-27 DIAGNOSIS — R7303 Prediabetes: Secondary | ICD-10-CM | POA: Diagnosis not present

## 2022-07-27 DIAGNOSIS — M7022 Olecranon bursitis, left elbow: Secondary | ICD-10-CM | POA: Diagnosis not present

## 2022-07-27 DIAGNOSIS — I270 Primary pulmonary hypertension: Secondary | ICD-10-CM | POA: Diagnosis not present

## 2022-07-27 DIAGNOSIS — E782 Mixed hyperlipidemia: Secondary | ICD-10-CM | POA: Diagnosis not present

## 2022-07-27 DIAGNOSIS — R809 Proteinuria, unspecified: Secondary | ICD-10-CM | POA: Diagnosis not present

## 2022-07-27 DIAGNOSIS — M25512 Pain in left shoulder: Secondary | ICD-10-CM | POA: Diagnosis not present

## 2022-07-27 DIAGNOSIS — I10 Essential (primary) hypertension: Secondary | ICD-10-CM | POA: Diagnosis not present

## 2022-07-27 DIAGNOSIS — K219 Gastro-esophageal reflux disease without esophagitis: Secondary | ICD-10-CM | POA: Diagnosis not present

## 2022-07-27 DIAGNOSIS — J449 Chronic obstructive pulmonary disease, unspecified: Secondary | ICD-10-CM | POA: Diagnosis not present

## 2022-07-27 DIAGNOSIS — G4733 Obstructive sleep apnea (adult) (pediatric): Secondary | ICD-10-CM | POA: Diagnosis not present

## 2022-07-27 DIAGNOSIS — I2510 Atherosclerotic heart disease of native coronary artery without angina pectoris: Secondary | ICD-10-CM | POA: Diagnosis not present

## 2022-07-29 ENCOUNTER — Encounter (HOSPITAL_COMMUNITY): Payer: Self-pay | Admitting: Internal Medicine

## 2022-07-29 ENCOUNTER — Other Ambulatory Visit (HOSPITAL_COMMUNITY): Payer: Self-pay

## 2022-07-29 ENCOUNTER — Ambulatory Visit (HOSPITAL_COMMUNITY)
Admission: RE | Admit: 2022-07-29 | Discharge: 2022-07-29 | Disposition: A | Discharge status: Home / Self Care | Payer: Medicare Other | Source: Ambulatory Visit | Attending: Internal Medicine | Admitting: Internal Medicine

## 2022-07-29 VITALS — BP 120/70 | HR 67 | Wt 182.8 lb

## 2022-07-29 DIAGNOSIS — J9610 Chronic respiratory failure, unspecified whether with hypoxia or hypercapnia: Secondary | ICD-10-CM | POA: Diagnosis not present

## 2022-07-29 DIAGNOSIS — G4733 Obstructive sleep apnea (adult) (pediatric): Secondary | ICD-10-CM | POA: Insufficient documentation

## 2022-07-29 DIAGNOSIS — Z79899 Other long term (current) drug therapy: Secondary | ICD-10-CM | POA: Diagnosis not present

## 2022-07-29 DIAGNOSIS — I110 Hypertensive heart disease with heart failure: Secondary | ICD-10-CM | POA: Insufficient documentation

## 2022-07-29 DIAGNOSIS — E785 Hyperlipidemia, unspecified: Secondary | ICD-10-CM | POA: Diagnosis not present

## 2022-07-29 DIAGNOSIS — I5032 Chronic diastolic (congestive) heart failure: Secondary | ICD-10-CM

## 2022-07-29 DIAGNOSIS — I2510 Atherosclerotic heart disease of native coronary artery without angina pectoris: Secondary | ICD-10-CM | POA: Insufficient documentation

## 2022-07-29 DIAGNOSIS — I358 Other nonrheumatic aortic valve disorders: Secondary | ICD-10-CM | POA: Diagnosis not present

## 2022-07-29 DIAGNOSIS — I272 Pulmonary hypertension, unspecified: Secondary | ICD-10-CM

## 2022-07-29 DIAGNOSIS — R809 Proteinuria, unspecified: Secondary | ICD-10-CM | POA: Diagnosis not present

## 2022-07-29 DIAGNOSIS — Z9981 Dependence on supplemental oxygen: Secondary | ICD-10-CM | POA: Diagnosis not present

## 2022-07-29 LAB — BRAIN NATRIURETIC PEPTIDE: B Natriuretic Peptide: 35 pg/mL (ref 0.0–100.0)

## 2022-07-29 LAB — BASIC METABOLIC PANEL
Anion gap: 13 (ref 5–15)
BUN: 7 mg/dL — ABNORMAL LOW (ref 8–23)
CO2: 21 mmol/L — ABNORMAL LOW (ref 22–32)
Calcium: 9.2 mg/dL (ref 8.9–10.3)
Chloride: 106 mmol/L (ref 98–111)
Creatinine, Ser: 0.97 mg/dL (ref 0.61–1.24)
GFR, Estimated: >60 mL/min (ref >60)
Glucose, Bld: 90 mg/dL (ref 70–99)
Potassium: 3.9 mmol/L (ref 3.5–5.1)
Sodium: 140 mmol/L (ref 135–145)

## 2022-07-29 LAB — ECHOCARDIOGRAM COMPLETE
Area-P 1/2: 3.12 cm2
Calc EF: 53.7 %
S' Lateral: 2.8 cm
Single Plane A2C EF: 53.4 %
Single Plane A4C EF: 52.9 %

## 2022-07-29 NOTE — Patient Instructions (Addendum)
There has been no changes to your medications.  Labs done today, your results will be available in MyChart, we will contact you for abnormal readings.  You have been referred to Dr. Erin Fulling at [facility]. His office will call you to arrange your appointment.  You are scheduled for a Cardiac Catheterization on Tuesday, March 19 with Dr. Glori Bickers.  1. Please arrive at the [facility] (Main Entrance A) at [facility]: [address] [place], [place] [address] at 11:30 AM (This time is two hours before your procedure to ensure your preparation). Free valet parking service is available.   Special note: Every effort is made to have your procedure done on time. Please understand that emergencies sometimes delay scheduled procedures.  2. Diet: Do not eat solid foods after midnight.  The patient may have clear liquids until 5am upon the day of the procedure.  3.  Medication instructions in preparation for your procedure:   Contrast Allergy: No  HOLD Spironolactone the morning of your procedure.  On the morning of your procedure, take any morning medicines NOT listed above.  You may use sips of water.  5. Plan for one night stay--bring personal belongings. 6. Bring a current list of your medications and current insurance cards. 7. You MUST have a responsible person to drive you home. 8. Someone MUST be with you the first 24 hours after you arrive home or your discharge will be delayed. 9. Please wear clothes that are easy to get on and off and wear slip-on shoes.  Your physician recommends that you schedule a follow-up appointment in: 6 months ( September) ** please call the office in July to arrange your follow up appointment.**  If you have any questions or concerns before your next appointment please send [place] a message through [place] or call our office at [phone].    TO LEAVE A MESSAGE FOR THE NURSE SELECT OPTION 2, PLEASE LEAVE A MESSAGE INCLUDING: YOUR  NAME DATE OF BIRTH CALL BACK NUMBER REASON FOR CALL**this is important as we prioritize the call backs  YOU WILL RECEIVE A CALL BACK THE SAME DAY AS LONG AS YOU CALL BEFORE 4:00 PM  At the [facility], you and your health needs are our priority. As part of our continuing mission to provide you with exceptional heart care, we have created designated Provider Care Teams. These Care Teams include your primary Cardiologist (physician) and Advanced Practice Providers (APPs- Physician Assistants and Nurse Practitioners) who all work together to provide you with the care you need, when you need it.   You may see any of the following providers on your designated Care Team at your next follow up: Dr Glori Bickers Dr Loralie Champagne Dr. Roxana Hires, NP Lyda Jester, [place] [facility] [place], [place] Forestine Na, NP Audry Riles, PharmD   Please be sure to bring in all your medications bottles to every appointment.    Thank you for choosing [facility]

## 2022-07-29 NOTE — Addendum Note (Signed)
Encounter addended by: Stanford Scotland, RN on: 07/29/2022 10:18 AM  Actions taken: Clinical Note Signed

## 2022-07-29 NOTE — Telephone Encounter (Signed)
A new Rx was sent on 07/22/22 to [facility].

## 2022-07-29 NOTE — Progress Notes (Signed)
6 Min Walk Test Completed  Pt ambulated 457.2 meters  O2 Sat ranged 90%-85 % on 8L oxygen HR ranged 77-110

## 2022-07-29 NOTE — Progress Notes (Signed)
Advanced Heart Failure Clinic Note   Date:  07/29/2022   ID:  Warren [name], DOB 1950/09/11, MRN [id number]  Location: Home  Provider location: [facility] Type of Visit: Established patient  PCP:  [name], MD  Cardiologist:  [name], MD Primary HF: Warren [name]  Chief Complaint: Heart Failure follow-up   History of Present Illness:   Warren [name] is a 72 y.o. male with a hx of HTN, HLD, pulmonary hypertension referred by Dr. [name] for further management of [place].    He has had a very complete w/u by Dr. [name]. He has had rather rapid onset of SOB associated with severe diffusion defect on PFTs with normal spirometry. CT negative for ILD. Echo showed normal EF with moderate to severe pHTN. VQ scan was negative.  His basic rheumatologic work-up was negative.     Underwent R/L cath 12/14/19 as below and started on Opsumit. At last visit sildenafil increased to 40 tid.   RHC 9/22  RA = 2 RV = 49/2 PA = 45/16 (28) PCW = 3 Fick cardiac output/index = 5.7/2.8 PVR = 4.4 WU Ao sat = 97% PA sat = 75%, 75% -> selex added  Now on Macitentan 10 daily, sildenafil 60 tid, selexipag 800 bid.   Here today for f/u with his wife. Feels good. Wears 3L O2. Says he has to be careful not to do too much or it wears him out. Weight up 4 pounds but no edema. Doesn't adjust O2 with exercise and sats can drop.   Echo today 07/29/22 EF 60-65% RV normal. TR inadequate to assess RVSP Personally reviewed   Echo 3/23 EF 60-65% RV normal TR inadequate to assess RVSP Echo 5/22 EF 50-55% RV normal Echo 05/01/20 EF 50-55% RV mildly dilated mild HK RVSP ~39mHG    PAH therapy 1. Macitentan '10mg'$   Started 8/21 2. Sildenafil 60 tid Started 10/21 3. Selexipag 800 bid  - 6MW 01/02/20 25106mith sats down to 79% - 6MW 03/14/20 29055mtopped for 1 min due to drop in O2) - 6MW 09/01/20 457m35mSat ranged 80%-93%  on 3-8L oxygen     R/L cath 12/14/19   EF 55-60%    Prox RCA lesion is 20% stenosed. Mid LAD lesion is 40% stenosed.   Findings:   Ao = 120/71 (95) LV = 117/8 RA = 5 RV = 67/6 PA = 68/28 (44) PCW = 6 Fick cardiac output/index = 4.9/2.4 PVR = 7.8 WU FA sat = 92% PA sat = 69%, 70%   Past Medical History:  Diagnosis Date   CHF (congestive heart failure) (HCC)[place] Coronary artery disease    Hyperlipidemia    Hypertension    Past Surgical History:  Procedure Laterality Date   CARDIAC CATHETERIZATION     RIGHT HEART CATH N/A 01/22/2021   Procedure: RIGHT HEART CATH;  Surgeon: BensJolaine Artist;  [facility];  Service: Cardiovascular;  Laterality: N/A;   RIGHT/LEFT HEART CATH AND CORONARY ANGIOGRAPHY N/A 12/14/2019   Procedure: RIGHT/LEFT HEART CATH AND CORONARY ANGIOGRAPHY;  Surgeon: BensJolaine Artist;  [facility];  Service: Cardiovascular;  Laterality: N/A;     Current Outpatient Medications  Medication Sig Dispense Refill   acetaminophen (TYLENOL) 500 MG tablet Take 1,000 mg by mouth every 6 (six) hours as needed for moderate pain or headache.     aspirin EC 81 MG tablet Take  1 tablet (81 mg total) by mouth daily. 90 tablet 3   ezetimibe (ZETIA) 10 MG tablet Take 10 mg by mouth daily.     OPSUMIT 10 MG tablet TAKE 1 TABLET DAILY. 30 tablet 11   OXYGEN Inhale 2-3 L into the lungs continuous. 2 L at rest and with activity 3 L     pantoprazole (PROTONIX) 40 MG tablet Take 40 mg by mouth daily.     rosuvastatin (CRESTOR) 5 MG tablet TAKE 1 TABLET DAILY 90 tablet 3   sildenafil (REVATIO) 20 MG tablet Take 3 tablets (60 mg total) by mouth 3 (three) times daily. 270 tablet 11   spironolactone (ALDACTONE) 25 MG tablet Take 0.5 tablets (12.5 mg total) by mouth daily. 45 tablet 3   UPTRAVI 800 MCG TABS TAKE 1 TABLET TWO TIMES DAILY. 60 tablet 11   No current facility-administered medications for this encounter.   Facility-Administered Medications Ordered in Other Encounters  Medication  Dose Route Frequency Provider Last Rate Last Admin   sodium chloride flush (NS) 0.9 % injection 10 mL  10 mL Intravenous PRN [name], Cassie Freer, MD   20 mL at 12/05/19 1125    Allergies:   Amlodipine besylate, Cefdinir, Penicillins, and Valsartan   Social History:  The patient  reports that he quit smoking about 20 years ago. His smoking use included cigarettes. He has never used smokeless tobacco. He reports that he does not currently use alcohol. He reports that he does not use drugs.   Family History:  The patient's family history includes Heart disease in his mother.   ROS:  Please see the history of present illness.   All other systems are personally reviewed and negative.   Vitals:   07/29/22 0911  BP: 120/70  Pulse: 67  SpO2: 95%  Weight: 82.9 kg (182 lb 12.8 oz)      Exam:  General:  Ambulated into clinic. Appears well. HEENT: flushing + telangiectasias on face Neck: supple. no JVD. Carotids 2+ bilat; no bruits. No lymphadenopathy or thryomegaly appreciated. Cor: PMI nondisplaced. Regular rate & rhythm. No rubs, gallops or murmurs. Lungs: clear decreased throughout Abdomen: soft, nontender, nondistended. No hepatosplenomegaly. No bruits or masses. Good bowel sounds. Extremities: no cyanosis, clubbing, rash, edema Neuro: alert & orientedx3, cranial nerves grossly intact. moves all 4 extremities w/o difficulty. Affect pleasant    Recent Labs: 11/06/2021: B Natriuretic Peptide 9.8 11/23/2021: BUN 13; Creatinine, Ser 0.88; Potassium 4.0; Sodium 139  Personally reviewed   Wt Readings from Last 3 Encounters:  07/29/22 82.9 kg (182 lb 12.8 oz)  02/19/22 80.9 kg (178 lb 6.4 oz)  11/06/21 80.4 kg (177 lb 3.2 oz)      ASSESSMENT AND PLAN:  1. Moderate to severe pulmonary HTN - PFT 08/31/2019: no obstruction/restriction, severe diffusion defect (DCLO 25%) - Echo 9/20 LVEF normal. RV mild to moderate HK  - Echo 05/01/20 EF 50-55% RV mildly to moderately HK  - Echo 5/22 EF  50-55% RV normal - Echo 03/23 EF 60-65%, RV normal - Echo today 07/29/22 EF 60-65% RV normal. TR inadequate to assess RVSP Personally reviewed - CT negative for ILD. ANA negative, anti-CCP negative, anti-RA negative - VQ scan negative CTEPH - Sleep study with mild PAH. Follows with Dr. Radford Pax - [place] as per HPI. Hemodynamics much improved with therapy.   - WHO Group I - likely IPAH - Stable NYHA II on triple therapy - macitentan and sildenafil 60 tid and selexipag 800 bid - 6MW 01/02/20 218m  with sats down to 79% - 6MW 03/14/20 269m(stopped for 1 min due to drop in O2) - 6MW 09/01/20 4538m2 Sat ranged 80%-93%  on 3-8L oxygen - 6MW 07/09/21 36663m Sat ranged 87%-97% on 6-8L oxygen - 6MW 11/06/21 518 m O2 sat ranged 80-88% on 6L oxygen - Reveal lite score 2 = low risk  - Continue triple therapy - Due for repeat RHC to assess candidacy for sotatarecept  - Labs today  2. Chronic respiratory failure [facility] to [facility] to [facility] O2. Follow sats with pulse ok and titrate to keep sats >= 90%  - will refer back to Pulmonary given high O2 requirement with exertion   3.  CAD -Three-vessel calcification seen on recent lung CT - Minimal CAD by cath  - No s/s angina - Followed by Dr. O'NAudie Boxas been intolerant of statins.   4. OSA - Mild OSA 1/22: AHI 5.1/hr. Sats down to 77% - Followed by Dr. TurRadford Pax. Proteinuria - Albumin/creatinine ratio 650 on labs at PCP's office in February now down to 29 - Continue spironolactone 12.5 mg daily   Signed, DanGlori BickersD  07/29/2022 9:40 AM  [facility] Cli[facility] [phone]) ([phone])

## 2022-07-29 NOTE — H&P (View-Only) (Signed)
   Advanced Heart Failure Clinic Note   Date:  07/29/2022   ID:  Warren [name], DOB 06/16/1950, MRN [id number]  Location: Home  Provider location: [facility] Type of Visit: Established patient  PCP:  Hall, John Z, MD  Cardiologist:  [place] T [name], MD Primary HF: Chelli Yerkes  Chief Complaint: Heart Failure follow-up   History of Present Illness:   Warren [name] is a 72 y.o. male with a hx of HTN, HLD, pulmonary hypertension referred by Dr. [name] for further management of PAH.    He has had a very complete w/u by Dr. [name]. He has had rather rapid onset of SOB associated with severe diffusion defect on PFTs with normal spirometry. CT negative for ILD. Echo showed normal EF with moderate to severe pHTN. VQ scan was negative.  His basic rheumatologic work-up was negative.     Underwent R/L cath 12/14/19 as below and started on Opsumit. At last visit sildenafil increased to 40 tid.   RHC 9/22  RA = 2 RV = 49/2 PA = 45/16 (28) PCW = 3 Fick cardiac output/index = 5.7/2.8 PVR = 4.4 WU Ao sat = 97% PA sat = 75%, 75% -> selex added  Now on Macitentan 10 daily, sildenafil 60 tid, selexipag 800 bid.   Here today for f/u with his wife. Feels good. Wears 3L O2. Says he has to be careful not to do too much or it wears him out. Weight up 4 pounds but no edema. Doesn't adjust O2 with exercise and sats can drop.   Echo today 07/29/22 EF 60-65% RV normal. TR inadequate to assess RVSP Personally reviewed   Echo 3/23 EF 60-65% RV normal TR inadequate to assess RVSP Echo 5/22 EF 50-55% RV normal Echo 05/01/20 EF 50-55% RV mildly dilated mild HK RVSP ~40mmHG    PAH therapy 1. Macitentan 10mg  Started 8/21 2. Sildenafil 60 tid Started 10/21 3. Selexipag 800 bid  - 6MW 01/02/20 259m with sats down to 79% - 6MW 03/14/20 290m (stopped for 1 min due to drop in O2) - 6MW 09/01/20 457m O2 Sat ranged 80%-93%  on 3-8L oxygen     R/L cath 12/14/19   EF 55-60%    Prox RCA lesion is 20% stenosed. Mid LAD lesion is 40% stenosed.   Findings:   Ao = 120/71 (95) LV = 117/8 RA = 5 RV = 67/6 PA = 68/28 (44) PCW = 6 Fick cardiac output/index = 4.9/2.4 PVR = 7.8 WU FA sat = 92% PA sat = 69%, 70%   Past Medical History:  Diagnosis Date   CHF (congestive heart failure) (HCC)    Coronary artery disease    Hyperlipidemia    Hypertension    Past Surgical History:  Procedure Laterality Date   CARDIAC CATHETERIZATION     RIGHT HEART CATH N/A 01/22/2021   Procedure: RIGHT HEART CATH;  Surgeon: Jourdin Gens R, MD;  [facility];  Service: Cardiovascular;  Laterality: N/A;   RIGHT/LEFT HEART CATH AND CORONARY ANGIOGRAPHY N/A 12/14/2019   Procedure: RIGHT/LEFT HEART CATH AND CORONARY ANGIOGRAPHY;  Surgeon: Dejour Vos R, MD;  [facility];  Service: Cardiovascular;  Laterality: N/A;     Current Outpatient Medications  Medication Sig Dispense Refill   acetaminophen (TYLENOL) 500 MG tablet Take 1,000 mg by mouth every 6 (six) hours as needed for moderate pain or headache.     aspirin EC 81 MG tablet Take   1 tablet (81 mg total) by mouth daily. 90 tablet 3   ezetimibe (ZETIA) 10 MG tablet Take 10 mg by mouth daily.     OPSUMIT 10 MG tablet TAKE 1 TABLET DAILY. 30 tablet 11   OXYGEN Inhale 2-3 L into the lungs continuous. 2 L at rest and with activity 3 L     pantoprazole (PROTONIX) 40 MG tablet Take 40 mg by mouth daily.     rosuvastatin (CRESTOR) 5 MG tablet TAKE 1 TABLET DAILY 90 tablet 3   sildenafil (REVATIO) 20 MG tablet Take 3 tablets (60 mg total) by mouth 3 (three) times daily. 270 tablet 11   spironolactone (ALDACTONE) 25 MG tablet Take 0.5 tablets (12.5 mg total) by mouth daily. 45 tablet 3   UPTRAVI 800 MCG TABS TAKE 1 TABLET TWO TIMES DAILY. 60 tablet 11   No current facility-administered medications for this encounter.   Facility-Administered Medications Ordered in Other Encounters  Medication  Dose Route Frequency Provider Last Rate Last Admin   sodium chloride flush (NS) 0.9 % injection 10 mL  10 mL Intravenous PRN [name], [place], MD   20 mL at 12/05/19 1125    Allergies:   Amlodipine besylate, Cefdinir, Penicillins, and Valsartan   Social History:  The patient  reports that he quit smoking about 20 years ago. His smoking use included cigarettes. He has never used smokeless tobacco. He reports that he does not currently use alcohol. He reports that he does not use drugs.   Family History:  The patient's family history includes Heart disease in his mother.   ROS:  Please see the history of present illness.   All other systems are personally reviewed and negative.   Vitals:   07/29/22 0911  BP: 120/70  Pulse: 67  SpO2: 95%  Weight: 82.9 kg (182 lb 12.8 oz)      Exam:  General:  Ambulated into clinic. Appears well. HEENT: flushing + telangiectasias on face Neck: supple. no JVD. Carotids 2+ bilat; no bruits. No lymphadenopathy or thryomegaly appreciated. Cor: PMI nondisplaced. Regular rate & rhythm. No rubs, gallops or murmurs. Lungs: clear decreased throughout Abdomen: soft, nontender, nondistended. No hepatosplenomegaly. No bruits or masses. Good bowel sounds. Extremities: no cyanosis, clubbing, rash, edema Neuro: alert & orientedx3, cranial nerves grossly intact. moves all 4 extremities w/o difficulty. Affect pleasant    Recent Labs: 11/06/2021: B Natriuretic Peptide 9.8 11/23/2021: BUN 13; Creatinine, Ser 0.88; Potassium 4.0; Sodium 139  Personally reviewed   Wt Readings from Last 3 Encounters:  07/29/22 82.9 kg (182 lb 12.8 oz)  02/19/22 80.9 kg (178 lb 6.4 oz)  11/06/21 80.4 kg (177 lb 3.2 oz)      ASSESSMENT AND PLAN:  1. Moderate to severe pulmonary HTN - PFT 08/31/2019: no obstruction/restriction, severe diffusion defect (DCLO 25%) - Echo 9/20 LVEF normal. RV mild to moderate HK  - Echo 05/01/20 EF 50-55% RV mildly to moderately HK  - Echo 5/22 EF  50-55% RV normal - Echo 03/23 EF 60-65%, RV normal - Echo today 07/29/22 EF 60-65% RV normal. TR inadequate to assess RVSP Personally reviewed - CT negative for ILD. ANA negative, anti-CCP negative, anti-RA negative - VQ scan negative CTEPH - Sleep study with mild PAH. Follows with Dr. Turner - RHC as per HPI. Hemodynamics much improved with therapy.   - WHO Group I - likely IPAH - Stable NYHA II on triple therapy - macitentan and sildenafil 60 tid and selexipag 800 bid - 6MW 01/02/20 259m   with sats down to 79% - 6MW 03/14/20 290m (stopped for 1 min due to drop in O2) - 6MW 09/01/20 457m O2 Sat ranged 80%-93%  on 3-8L oxygen - 6MW 07/09/21 366m O2 Sat ranged 87%-97% on 6-8L oxygen - 6MW 11/06/21 518 m O2 sat ranged 80-88% on 6L oxygen - Reveal lite score 2 = low risk  - Continue triple therapy - Due for repeat RHC to assess candidacy for sotatarecept  - Labs today  2. Chronic respiratory failure due to PAH - continue to wear O2. Follow sats with pulse ok and titrate to keep sats >= 90%  - will refer back to Pulmonary given high O2 requirement with exertion   3.  CAD -Three-vessel calcification seen on recent lung CT - Minimal CAD by cath  - No s/s angina - Followed by Dr. [name]. Has been intolerant of statins.   4. OSA - Mild OSA 1/22: AHI 5.1/hr. Sats down to 77% - Followed by Dr. Turner  5. Proteinuria - Albumin/creatinine ratio 650 on labs at PCP's office in February now down to 29 - Continue spironolactone 12.5 mg daily   Signed, Vadie Principato, MD  07/29/2022 9:40 AM  [facility] [place] [address] [facility] [place] [address] [phone] (office) [phone] (fax)  

## 2022-07-29 NOTE — Progress Notes (Signed)
Echocardiogram 2D Echocardiogram has been performed.  Warren [name] 07/29/2022, 8:50 AM

## 2022-08-04 ENCOUNTER — Telehealth: Payer: Self-pay | Admitting: Internal Medicine

## 2022-08-04 NOTE — Telephone Encounter (Signed)
Pt being referred by Dr Haroldine Laws for pulmonary hypertension. Pt is still established with Dr Melvyn Novas ([facility] 11/22/19). He is being referred for pulm hypertension to [facility], however he will likely need to see [facility] given the dx. Please advise if pt is okay to switch providers and to whom he needs to schedule with.

## 2022-08-05 NOTE — Telephone Encounter (Signed)
Fine with me to change to [facility]

## 2022-08-05 NOTE — Telephone Encounter (Signed)
Dr. Melvyn Novas, please advise if you are ok with him switching to Dr. Silas Flood.   Dr. Silas Flood, please advise if you are ok with seeing this patient. Thanks!

## 2022-08-06 NOTE — Telephone Encounter (Signed)
Ok with me. thanks 

## 2022-08-06 NOTE — Telephone Encounter (Signed)
Pt scheduled with Dr Silas Flood on 4/9. Reminder has been mailed. Nothing further needed.

## 2022-08-07 ENCOUNTER — Other Ambulatory Visit (HOSPITAL_COMMUNITY): Payer: Self-pay | Admitting: Internal Medicine

## 2022-08-10 ENCOUNTER — Encounter (HOSPITAL_COMMUNITY)
Admission: RE | Disposition: A | Discharge status: Home / Self Care | Payer: Self-pay | Source: Home / Self Care | Attending: Internal Medicine

## 2022-08-10 ENCOUNTER — Other Ambulatory Visit: Payer: Self-pay

## 2022-08-10 ENCOUNTER — Ambulatory Visit (HOSPITAL_COMMUNITY)
Admission: RE | Admit: 2022-08-10 | Discharge: 2022-08-10 | Disposition: A | Discharge status: Home / Self Care | Payer: Medicare Other | Attending: Internal Medicine | Admitting: Internal Medicine

## 2022-08-10 DIAGNOSIS — G4733 Obstructive sleep apnea (adult) (pediatric): Secondary | ICD-10-CM | POA: Diagnosis not present

## 2022-08-10 DIAGNOSIS — I509 Heart failure, unspecified: Secondary | ICD-10-CM | POA: Insufficient documentation

## 2022-08-10 DIAGNOSIS — R809 Proteinuria, unspecified: Secondary | ICD-10-CM | POA: Insufficient documentation

## 2022-08-10 DIAGNOSIS — I2510 Atherosclerotic heart disease of native coronary artery without angina pectoris: Secondary | ICD-10-CM | POA: Diagnosis not present

## 2022-08-10 DIAGNOSIS — I110 Hypertensive heart disease with heart failure: Secondary | ICD-10-CM | POA: Insufficient documentation

## 2022-08-10 DIAGNOSIS — Z79899 Other long term (current) drug therapy: Secondary | ICD-10-CM | POA: Insufficient documentation

## 2022-08-10 DIAGNOSIS — Z87891 Personal history of nicotine dependence: Secondary | ICD-10-CM | POA: Insufficient documentation

## 2022-08-10 DIAGNOSIS — I272 Pulmonary hypertension, unspecified: Secondary | ICD-10-CM | POA: Insufficient documentation

## 2022-08-10 DIAGNOSIS — J9610 Chronic respiratory failure, unspecified whether with hypoxia or hypercapnia: Secondary | ICD-10-CM | POA: Insufficient documentation

## 2022-08-10 HISTORY — PX: RIGHT HEART CATH: CATH118263

## 2022-08-10 LAB — CBC
HCT: 43.9 % (ref 39.0–52.0)
Hemoglobin: 14.9 g/dL (ref 13.0–17.0)
MCH: 31.3 pg (ref 26.0–34.0)
MCHC: 33.9 g/dL (ref 30.0–36.0)
MCV: 92.2 fL (ref 80.0–100.0)
Platelets: 187 10*3/uL (ref 150–400)
RBC: 4.76 MIL/uL (ref 4.22–5.81)
RDW: 13.2 % (ref 11.5–15.5)
WBC: 8 10*3/uL (ref 4.0–10.5)
nRBC: 0 % (ref 0.0–0.2)

## 2022-08-10 LAB — POCT I-STAT EG7
Acid-Base Excess: 0 mmol/L (ref 0.0–2.0)
Acid-base deficit: 1 mmol/L (ref 0.0–2.0)
Bicarbonate: 23 mmol/L (ref 20.0–28.0)
Bicarbonate: 23.6 mmol/L (ref 20.0–28.0)
Calcium, Ion: 1.11 mmol/L — ABNORMAL LOW (ref 1.15–1.40)
Calcium, Ion: 1.19 mmol/L (ref 1.15–1.40)
HCT: 40 % (ref 39.0–52.0)
HCT: 42 % (ref 39.0–52.0)
Hemoglobin: 13.6 g/dL (ref 13.0–17.0)
Hemoglobin: 14.3 g/dL (ref 13.0–17.0)
O2 Saturation: 68 %
O2 Saturation: 69 %
Potassium: 3.5 mmol/L (ref 3.5–5.1)
Potassium: 3.7 mmol/L (ref 3.5–5.1)
Sodium: 144 mmol/L (ref 135–145)
Sodium: 145 mmol/L (ref 135–145)
TCO2: 24 mmol/L (ref 22–32)
TCO2: 25 mmol/L (ref 22–32)
pCO2, Ven: 35.1 mmHg — ABNORMAL LOW (ref 44–60)
pCO2, Ven: 36.1 mmHg — ABNORMAL LOW (ref 44–60)
pH, Ven: 7.423 (ref 7.25–7.43)
pH, Ven: 7.424 (ref 7.25–7.43)
pO2, Ven: 34 mmHg (ref 32–45)
pO2, Ven: 35 mmHg (ref 32–45)

## 2022-08-10 SURGERY — RIGHT HEART CATH
Anesthesia: LOCAL

## 2022-08-10 MED ORDER — SODIUM CHLORIDE 0.9% FLUSH: 3.0000 mL | Freq: Two times a day (BID) | INTRAVENOUS | Status: DC

## 2022-08-10 MED ORDER — LIDOCAINE HCL (PF) 1 % IJ SOLN
INTRAMUSCULAR | Status: DC | PRN
  Administered 2022-08-10: 2 mL

## 2022-08-10 MED ORDER — SODIUM CHLORIDE 0.9% FLUSH: 3.0000 mL | INTRAVENOUS | Status: DC | PRN

## 2022-08-10 MED ORDER — LIDOCAINE HCL (PF) 1 % IJ SOLN
INTRAMUSCULAR | Status: AC
  Filled 2022-08-10: qty 30

## 2022-08-10 MED ORDER — SODIUM CHLORIDE 0.9 % IV SOLN: INTRAVENOUS | Status: DC

## 2022-08-10 MED ORDER — ACETAMINOPHEN 325 MG PO TABS: 650.0000 mg | ORAL_TABLET | ORAL | Status: DC | PRN

## 2022-08-10 MED ORDER — ONDANSETRON HCL 4 MG/2ML IJ SOLN: 4.0000 mg | Freq: Four times a day (QID) | INTRAMUSCULAR | Status: DC | PRN

## 2022-08-10 MED ORDER — SODIUM CHLORIDE 0.9 % IV SOLN: 250.0000 mL | INTRAVENOUS | Status: DC | PRN

## 2022-08-10 MED ORDER — HYDRALAZINE HCL 20 MG/ML IJ SOLN: 10.0000 mg | INTRAMUSCULAR | Status: DC | PRN

## 2022-08-10 MED ORDER — LABETALOL HCL 5 MG/ML IV SOLN: 10.0000 mg | INTRAVENOUS | Status: DC | PRN

## 2022-08-10 MED ORDER — HEPARIN (PORCINE) IN NACL 1000-0.9 UT/500ML-% IV SOLN
INTRAVENOUS | Status: DC | PRN
  Administered 2022-08-10: 500 mL

## 2022-08-10 MED ORDER — FENTANYL CITRATE (PF) 100 MCG/2ML IJ SOLN
INTRAMUSCULAR | Status: AC
  Filled 2022-08-10: qty 2

## 2022-08-10 MED ORDER — MIDAZOLAM HCL 2 MG/2ML IJ SOLN
INTRAMUSCULAR | Status: AC
  Filled 2022-08-10: qty 2

## 2022-08-10 SURGICAL SUPPLY — 6 items
CATH BALLN WEDGE 5F 110CM (CATHETERS) IMPLANT
PACK CARDIAC CATHETERIZATION (CUSTOM PROCEDURE TRAY) ×1 IMPLANT
SHEATH GLIDE SLENDER 4/5FR (SHEATH) IMPLANT
TRANSDUCER W/STOPCOCK (MISCELLANEOUS) ×1 IMPLANT
TUBING ART PRESS 72  MALE/FEM (TUBING) ×1
TUBING ART PRESS 72 MALE/FEM (TUBING) IMPLANT

## 2022-08-10 NOTE — Interval H&P Note (Signed)
History and Physical Interval Note:  08/10/2022 2:33 PM  Warren [name]  has presented today for surgery, with the diagnosis of pulmonary htn.  The various methods of treatment have been discussed with the patient and family. After consideration of risks, benefits and other options for treatment, the patient has consented to  Procedure(s): RIGHT HEART CATH (N/A) as a surgical intervention.  The patient's history has been reviewed, patient examined, no change in status, stable for surgery.  I have reviewed the patient's chart and labs.  Questions were answered to the patient's satisfaction.     [name]

## 2022-08-10 NOTE — Discharge Instructions (Addendum)
CALL DR BENSIMHON'S OFFICE IF ANY PROBLEMS, QUESTIONS, OR CONCERNS; CALL IF ANY BLEEDING, DRAINAGE, FEVER, PAIN, SWELLING OR REDNESS AT RIGHT ARM SITE

## 2022-08-11 ENCOUNTER — Encounter (HOSPITAL_COMMUNITY): Payer: Self-pay | Admitting: Internal Medicine

## 2022-08-31 ENCOUNTER — Ambulatory Visit (INDEPENDENT_AMBULATORY_CARE_PROVIDER_SITE_OTHER): Payer: Medicare Other | Admitting: Pulmonary Disease

## 2022-08-31 ENCOUNTER — Encounter: Payer: Self-pay | Admitting: Pulmonary Disease

## 2022-08-31 VITALS — BP 132/70 | HR 100 | Ht 69.0 in | Wt 183.0 lb

## 2022-08-31 DIAGNOSIS — J8410 Pulmonary fibrosis, unspecified: Secondary | ICD-10-CM | POA: Diagnosis not present

## 2022-08-31 DIAGNOSIS — J9611 Chronic respiratory failure with hypoxia: Secondary | ICD-10-CM

## 2022-08-31 MED ORDER — MONTELUKAST SODIUM 10 MG PO TABS: 10.0000 mg | ORAL_TABLET | Freq: Every day | ORAL | 0 refills | Status: DC

## 2022-08-31 NOTE — Patient Instructions (Signed)
Try Montelukast 1 tab at night for nasal congestion, runny nose due to pollens

## 2022-09-05 ENCOUNTER — Other Ambulatory Visit (HOSPITAL_COMMUNITY): Payer: Self-pay | Admitting: Internal Medicine

## 2022-09-06 ENCOUNTER — Other Ambulatory Visit (HOSPITAL_COMMUNITY): Payer: Self-pay | Admitting: Internal Medicine

## 2022-09-27 ENCOUNTER — Other Ambulatory Visit: Payer: Self-pay | Admitting: Cardiovascular Disease

## 2022-09-29 ENCOUNTER — Other Ambulatory Visit: Payer: Self-pay

## 2022-09-29 ENCOUNTER — Ambulatory Visit
Admission: EM | Admit: 2022-09-29 | Discharge: 2022-09-29 | Disposition: A | Discharge status: Home / Self Care | Payer: Medicare Other | Attending: Family Medicine | Admitting: Family Medicine

## 2022-09-29 ENCOUNTER — Encounter: Payer: Self-pay | Admitting: Emergency Medicine

## 2022-09-29 DIAGNOSIS — J029 Acute pharyngitis, unspecified: Secondary | ICD-10-CM | POA: Diagnosis not present

## 2022-09-29 DIAGNOSIS — R000 Tachycardia, unspecified: Secondary | ICD-10-CM | POA: Diagnosis not present

## 2022-09-29 DIAGNOSIS — Z87891 Personal history of nicotine dependence: Secondary | ICD-10-CM | POA: Insufficient documentation

## 2022-09-29 DIAGNOSIS — Z1152 Encounter for screening for COVID-19: Secondary | ICD-10-CM | POA: Insufficient documentation

## 2022-09-29 LAB — POCT RAPID STREP A (OFFICE): Rapid Strep A Screen: NEGATIVE

## 2022-09-29 MED ORDER — LIDOCAINE VISCOUS HCL 2 % MT SOLN: 5.0000 mL | Freq: Four times a day (QID) | OROMUCOSAL | 0 refills | Status: DC | PRN

## 2022-09-29 NOTE — ED Triage Notes (Signed)
Sore throat that started this morning.  States his heart rate has been up.

## 2022-09-29 NOTE — Discharge Instructions (Addendum)
You may use over the counter ibuprofen or acetaminophen as needed.  For a sore throat, over the counter products such as [employer] Peroxyl Mouth Sore Rinse or Chloraseptic Sore Throat Spray may provide some temporary relief. Your rapid strep test was negative today. We have sent your throat swab for culture and will let you know of any positive results. 

## 2022-09-30 LAB — SARS CORONAVIRUS 2 (TAT 6-24 HRS): SARS Coronavirus 2: NEGATIVE

## 2022-09-30 NOTE — ED Provider Notes (Signed)
[facility]   [id number] 09/29/22 Arrival Time: 1315  ASSESSMENT & PLAN:  1. Sore throat   2. Tachycardia    ECG: NSR; no acute changes when compared to previous.  No signs of peritonsillar abscess. [place] strep negative. Throat culture sent. For night-time comfort, if needed: Meds ordered this encounter  Medications   magic mouthwash (lidocaine, diphenhydrAMINE, alum & mag hydroxide) suspension    Sig: Swish and spit 5 mLs 4 (four) times daily as needed for mouth pain.    Dispense:  360 mL    Refill:  0    Results for orders placed or performed during the hospital encounter of 09/29/22  SARS CORONAVIRUS 2 (TAT 6-24 HRS) Anterior Nasal Swab   Specimen: Anterior Nasal Swab  Result Value Ref Range   SARS Coronavirus 2 NEGATIVE NEGATIVE  POCT rapid strep A  Result Value Ref Range   Rapid Strep A Screen Negative Negative    Follow-up Information     [name], MD.   Specialty: Internal Medicine Why: As needed. Contact information: [address] [place] [facility] [address] [phone]         Schedule an appointment as soon as possible for a visit  with Bensimhon, Bevelyn Buckles, MD.   Specialty: Cardiology Contact information: [address] [place] [address] [phone]  For evaluation of perceived tachycardia. HR around 90-100 today; same approx 4 weeks ago.                  Discharge Instructions      You may use over the counter ibuprofen or acetaminophen as needed.  For a sore throat, over the counter products such as [employer] Peroxyl Mouth Sore Rinse or Chloraseptic Sore Throat Spray may provide some temporary relief. Your rapid strep test was negative today. We have sent your throat swab for culture and will let you know of any positive results.   Reviewed expectations re: course of current medical issues. Questions answered. Outlined signs and symptoms indicating need for more acute intervention. Patient verbalized  understanding. After Visit Summary given.   SUBJECTIVE:  Warren [name] is a 72 y.o. male who reports a sore throat. Sore throat that started this morning. Denies fever. Tolerating PO intake. States his heart rate has been up. Denies CP or changes from baseline resp status. On home O2. Denies n/v.   OBJECTIVE:  Vitals:   09/29/22 1322  BP: 136/79  Pulse: 100  Resp: (!) 22  Temp: 97.9 F (36.6 C)  TempSrc: Oral  SpO2: 91%    Recheck RR: 18 General appearance: alert; no distress HEENT: throat with mild erythema and cobblestoning; uvula is midline Neck: supple with FROM; no lymphadenopathy CV: regular Lungs: speaks full sentences without difficulty; unlabored Abd: soft; non-tender Skin: reveals no rash; warm and dry Psychological: alert and cooperative; normal mood and affect  Allergies  Allergen Reactions   Amlodipine Besylate     tachycardia   Cefdinir Diarrhea   Penicillins     Had once as child and had resp problems. Edema   Valsartan     Hyperkalemia    Past Medical History:  Diagnosis Date   CHF (congestive heart failure) (HCC)    Coronary artery disease    Hyperlipidemia    Hypertension    Social History   Socioeconomic History   Marital status: Married    Spouse name: Not on file   Number of children: Not on file   Years  of education: Not on file   Highest education level: Bachelor's degree (e.g., BA, AB, BS)  Occupational History   Not on file  Tobacco Use   Smoking status: Former    Types: Cigarettes    Quit date: 05/24/2002    Years since quitting: 20.3   Smokeless tobacco: Never  Substance and Sexual Activity   Alcohol use: Not Currently   Drug use: Never   Sexual activity: Not Currently  Other Topics Concern   Not on file  Social History Narrative   Not on file   Social Determinants of Health   Financial Resource Strain: Not on file  Food Insecurity: Not on file  Transportation Needs: Not on file  Physical Activity: Not on file   Stress: Not on file  Social Connections: Not on file  Intimate Partner Violence: Not on file   Family History  Problem Relation Age of Onset   Heart disease Mother            Mardella Layman, MD 09/30/22 [phone]

## 2022-10-02 LAB — CULTURE, GROUP A STREP (THRC)

## 2022-11-12 ENCOUNTER — Other Ambulatory Visit (HOSPITAL_COMMUNITY): Payer: Self-pay | Admitting: Internal Medicine

## 2022-11-13 NOTE — Progress Notes (Unsigned)
Cardiology Office Note:   Date:  11/16/2022  NAME:  Warren [name]    MRN: [id number] DOB:  1950-08-07   PCP:  Warren Stabile, MD  Cardiologist:  [name], MD  Electrophysiologist:  None   Referring MD: Warren Stabile, MD   Chief Complaint  Patient presents with   Follow-up    History of Present Illness:   Warren [name] is a 72 y.o. male with a hx of pHTN, HLD, HTN, CAD who presents for follow-up.  He reports he is doing well.  His pulmonary hypertension is stable.  He is on triple therapy and follow with Dr. [name].  He is working with Dr. Judeth Horn.  There are newer agents available.  He is going to look into this.  Still having exertional shortness of breath and exertional hypoxemia.  His lipids are well-controlled.  No symptoms of angina.  Warren [name] is stable.  No signs of heart failure today.  Problem List 1. Idiopathic pulmonary hypertension  -RVSP 44 -> 68 (2020->2021) -PFT 08/31/2019: no obstruction, severe diffusion defect (DCLO 25%) -CT negative for ILD -ANA negative, anti-CCP negative, anti-RA negative -VQ scan negative CTEPH -minimal OSA -RHC 12/14/2019: mPAP 44, PVR 7.8 WU -RHC 01/22/2021: mPAP 28, PVR 4.4 WU -RHC 08/10/2022: mPAP 80m, PVR 4.7 WU 2. HLD -T chol 88, LDL 35, HDL 38, TG 71 3. HTN -A1c 5. 7 4.  CAD -Three-vessel calcification seen on recent lung CT -Nuclear medicine stress test normal September 2020 -LHC 12/14/2019: 20% RCA 40% LAD  Past Medical History: Past Medical History:  Diagnosis Date   CHF (congestive heart failure) (HCC)    Coronary artery disease    Hyperlipidemia    Hypertension     Past Surgical History: Past Surgical History:  Procedure Laterality Date   CARDIAC CATHETERIZATION     RIGHT HEART CATH N/A 01/22/2021   Procedure: RIGHT HEART CATH;  Surgeon: [name], MD;  Location: [facility];  Service: Cardiovascular;  Laterality: N/A;   RIGHT HEART CATH N/A 08/10/2022   Procedure: RIGHT HEART CATH;  Surgeon:  [name], MD;  Location: [facility];  Service: Cardiovascular;  Laterality: N/A;   RIGHT/LEFT HEART CATH AND CORONARY ANGIOGRAPHY N/A 12/14/2019   Procedure: RIGHT/LEFT HEART CATH AND CORONARY ANGIOGRAPHY;  Surgeon: [name], MD;  Location: [facility];  Service: Cardiovascular;  Laterality: N/A;    Current Medications: Current Meds  Medication Sig   acetaminophen (TYLENOL) 500 MG tablet Take 1,000 mg by mouth every 6 (six) hours as needed for moderate pain or headache.   aspirin EC 81 MG tablet Take 1 tablet (81 mg total) by mouth daily.   ezetimibe (ZETIA) 10 MG tablet Take 10 mg by mouth daily.   OPSUMIT 10 MG tablet TAKE 1 TABLET DAILY.   OXYGEN Inhale 2-3 L into the lungs continuous. 2 L at rest and 3 L with activity   pantoprazole (PROTONIX) 40 MG tablet Take 40 mg by mouth daily.   rosuvastatin (CRESTOR) 5 MG tablet Take 1 tablet (5 mg total) by mouth daily. KEEP OV.   sildenafil (REVATIO) 20 MG tablet TAKE 3 TABLETS BY MOUTH THREE TIMES DAILY   spironolactone (ALDACTONE) 25 MG tablet Take 0.5 tablets (12.5 mg total) by mouth daily.   UPTRAVI 800 MCG TABS TAKE 1 TABLET TWO TIMES DAILY.     Allergies:    Amlodipine besylate, Cefdinir, Penicillins, and Valsartan   Social History: Social History  Socioeconomic History   Marital status: Married    Spouse name: Not on file   Number of children: Not on file   Years of education: Not on file   Highest education level: Bachelor's degree (e.g., BA, AB, BS)  Occupational History   Not on file  Tobacco Use   Smoking status: Former    Types: Cigarettes    Quit date: 05/24/2002    Years since quitting: 20.4   Smokeless tobacco: Never  Substance and Sexual Activity   Alcohol use: Not Currently   Drug use: Never   Sexual activity: Not Currently  Other Topics Concern   Not on file  Social History Narrative   Not on file   Social Determinants of Health   Financial Resource Strain: Not on  file  Food Insecurity: Not on file  Transportation Needs: Not on file  Physical Activity: Not on file  Stress: Not on file  Social Connections: Not on file     Family History: The patient's family history includes Heart disease in his mother.  ROS:   All other ROS reviewed and negative. Pertinent positives noted in the HPI.     EKGs/Labs/Other Studies Reviewed:   The following studies were personally reviewed by me today:  EKG:  EKG is not ordered today.        TTE 07/29/2022  1. Left ventricular ejection fraction, by estimation, is 60 to 65%. The  left ventricle has normal [name]. The left ventricle has no regional  wall motion abnormalities. Left ventricular diastolic parameters were  normal.   2. Right ventricular systolic [name] is normal. The right ventricular  size is normal. There is normal pulmonary artery systolic pressure.   3. The mitral valve is normal in structure. Trivial mitral valve  regurgitation. No evidence of mitral stenosis.   4. The aortic valve is tricuspid. There is mild calcification of the  aortic valve. Aortic valve regurgitation is not visualized. No aortic  stenosis is present.   5. The inferior vena cava is normal in size with greater than 50%  respiratory variability, suggesting right atrial pressure of 3 mmHg.   Recent Labs: 07/29/2022: B Natriuretic Peptide 35.0; BUN 7; Creatinine, Ser 0.97 08/10/2022: Hemoglobin 14.3; Hemoglobin 13.6; Platelets 187; Potassium 3.7; Potassium 3.5; Sodium 144; Sodium 145   Recent Lipid Panel    Component Value Date/Time   CHOL 133 11/16/2008 0057   TRIG 120 11/16/2008 0057   HDL 41 11/16/2008 0057   CHOLHDL 3.2 Ratio 11/16/2008 0057   VLDL 24 11/16/2008 0057   LDLCALC 68 11/16/2008 0057    Physical Exam:   VS:  BP 120/62   Pulse 79   Ht 5' 9.5" (1.765 m)   Wt 181 lb 12.8 oz (82.5 kg)   SpO2 93%   BMI 26.46 kg/m    Wt Readings from Last 3 Encounters:  11/16/22 181 lb 12.8 oz (82.5 kg)  08/31/22  183 lb (83 kg)  08/10/22 177 lb (80.3 kg)    General: Well nourished, well developed, in no acute distress Head: Atraumatic, normal size  Eyes: PEERLA, EOMI  Neck: Supple, no JVD Endocrine: No thryomegaly Cardiac: Normal S1, S2; RRR; no murmurs, rubs, or gallops Lungs: Clear to auscultation bilaterally, no wheezing, rhonchi or rales  Abd: Soft, nontender, no hepatomegaly  Ext: No edema, pulses 2+ Musculoskeletal: No deformities, BUE and BLE strength normal and equal Skin: Warm and dry, no rashes   Neuro: Alert and oriented to person, place, time, and situation,  CNII-XII grossly intact, no focal deficits  Psych: Normal mood and affect   ASSESSMENT:   INRI SOBIESKI is a 72 y.o. male who presents for the following: 1. Pulmonary HTN (HCC)   2. Coronary artery disease involving native coronary artery of native heart without angina pectoris   3. Mixed hyperlipidemia     PLAN:   1. Pulmonary HTN (HCC) -Primary idiopathic pulmonary hypertension.  Followed by Dr. [name].  On triple therapy.  Pressures have remained stable.  No signs of heart failure.  He will continue with Dr. [name].  Also seeing Dr. Judeth Horn.  2. Coronary artery disease involving native coronary artery of native heart without angina pectoris 3. Mixed hyperlipidemia -Nonobstructive CAD.  On Crestor and Zetia.  LDL is at goal.  On an aspirin.  He will see [place] yearly.      Disposition: Return in about 1 year (around 11/16/2023).  Medication Adjustments/Labs and Tests Ordered: Current medicines are reviewed at length with the patient today.  Concerns regarding medicines are outlined above.  No orders of the defined types were placed in this encounter.  No orders of the defined types were placed in this encounter.  Patient Instructions  Medication Instructions:  Your physician recommends that you continue on your current medications as directed. Please refer to the Current Medication list given to you today.   *If you need a refill on your cardiac medications before your next appointment, please call your pharmacy*   Lab Work: NONE If you have labs (blood work) drawn today and your tests are completely normal, you will receive your results only by: MyChart Message (if you have MyChart) OR A paper copy in the mail If you have any lab test that is abnormal or we need to change your treatment, we will call you to review the results.   Testing/Procedures: NONE   Follow-Up: At [facility] you and your health needs are our priority.  As part of our continuing mission to provide you with exceptional heart care, we have created designated Provider Care Teams.  These Care Teams include your primary Cardiologist (physician) and Advanced Practice Providers (APPs -  Physician Assistants and Nurse Practitioners) who all work together to provide you with the care you need, when you need it.  We recommend signing up for the patient portal called "MyChart".  Sign up information is provided on this After Visit Summary.  MyChart is used to connect with patients for Virtual Visits (Telemedicine).  Patients are able to view lab/test results, encounter notes, upcoming appointments, etc.  Non-urgent messages can be sent to your provider as well.   To learn more about what you can do with MyChart, go to [email or web address].    Your next appointment:   1 year(s)  Provider:   [name], MD       Time Spent with Patient: I have spent a total of 25 minutes with patient reviewing hospital notes, telemetry, EKGs, labs and examining the patient as well as establishing an assessment and plan that was discussed with the patient.  > 50% of time was spent in direct patient care.  Signed, Lenna Gilford. [name], MD, [facility]  [facility]  [address], [address] [place], [place] [address] [phone]  11/16/2022 2:04 PM

## 2022-11-16 ENCOUNTER — Ambulatory Visit: Payer: Medicare Other | Attending: Cardiovascular Disease | Admitting: Cardiovascular Disease

## 2022-11-16 ENCOUNTER — Encounter: Payer: Self-pay | Admitting: Cardiovascular Disease

## 2022-11-16 VITALS — BP 120/62 | HR 79 | Ht 69.5 in | Wt 181.8 lb

## 2022-11-16 DIAGNOSIS — I2510 Atherosclerotic heart disease of native coronary artery without angina pectoris: Secondary | ICD-10-CM | POA: Diagnosis not present

## 2022-11-16 DIAGNOSIS — E782 Mixed hyperlipidemia: Secondary | ICD-10-CM | POA: Insufficient documentation

## 2022-11-16 DIAGNOSIS — I272 Pulmonary hypertension, unspecified: Secondary | ICD-10-CM | POA: Insufficient documentation

## 2022-11-16 NOTE — Patient Instructions (Signed)
Medication Instructions:  Your physician recommends that you continue on your current medications as directed. Please refer to the Current Medication list given to you today.  *If you need a refill on your cardiac medications before your next appointment, please call your pharmacy*   Lab Work: NONE If you have labs (blood work) drawn today and your tests are completely normal, you will receive your results only by: MyChart Message (if you have MyChart) OR A paper copy in the mail If you have any lab test that is abnormal or we need to change your treatment, we will call you to review the results.   Testing/Procedures: NONE   Follow-Up: At [facility], you and your health needs are our priority.  As part of our continuing mission to provide you with exceptional heart care, we have created designated Provider Care Teams.  These Care Teams include your primary Cardiologist (physician) and Advanced Practice Providers (APPs -  Physician Assistants and Nurse Practitioners) who all work together to provide you with the care you need, when you need it.  We recommend signing up for the patient portal called "MyChart".  Sign up information is provided on this After Visit Summary.  MyChart is used to connect with patients for Virtual Visits (Telemedicine).  Patients are able to view lab/test results, encounter notes, upcoming appointments, etc.  Non-urgent messages can be sent to your provider as well.   To learn more about what you can do with MyChart, go to [email or web address].    Your next appointment:   1 year(s)  Provider:   [place] T [name], MD    

## 2022-11-19 DIAGNOSIS — L820 Inflamed seborrheic keratosis: Secondary | ICD-10-CM | POA: Diagnosis not present

## 2022-11-19 DIAGNOSIS — C44311 Basal cell carcinoma of skin of nose: Secondary | ICD-10-CM | POA: Diagnosis not present

## 2022-11-19 DIAGNOSIS — C44519 Basal cell carcinoma of skin of other part of trunk: Secondary | ICD-10-CM | POA: Diagnosis not present

## 2022-11-19 DIAGNOSIS — L570 Actinic keratosis: Secondary | ICD-10-CM | POA: Diagnosis not present

## 2022-11-19 DIAGNOSIS — X32XXXD Exposure to sunlight, subsequent encounter: Secondary | ICD-10-CM | POA: Diagnosis not present

## 2022-11-19 DIAGNOSIS — C44319 Basal cell carcinoma of skin of other parts of face: Secondary | ICD-10-CM | POA: Diagnosis not present

## 2022-11-28 ENCOUNTER — Other Ambulatory Visit (HOSPITAL_COMMUNITY): Payer: Self-pay | Admitting: Internal Medicine

## 2022-12-19 ENCOUNTER — Other Ambulatory Visit: Payer: Self-pay | Admitting: Cardiology

## 2022-12-31 DIAGNOSIS — C44519 Basal cell carcinoma of skin of other part of trunk: Secondary | ICD-10-CM | POA: Diagnosis not present

## 2022-12-31 DIAGNOSIS — Z08 Encounter for follow-up examination after completed treatment for malignant neoplasm: Secondary | ICD-10-CM | POA: Diagnosis not present

## 2022-12-31 DIAGNOSIS — Z85828 Personal history of other malignant neoplasm of skin: Secondary | ICD-10-CM | POA: Diagnosis not present

## 2022-12-31 DIAGNOSIS — C44319 Basal cell carcinoma of skin of other parts of face: Secondary | ICD-10-CM | POA: Diagnosis not present

## 2023-01-21 DIAGNOSIS — R7303 Prediabetes: Secondary | ICD-10-CM | POA: Diagnosis not present

## 2023-01-21 DIAGNOSIS — E782 Mixed hyperlipidemia: Secondary | ICD-10-CM | POA: Diagnosis not present

## 2023-01-22 NOTE — Progress Notes (Signed)
@Patient  ID: Warren [name], male    DOB: Dec 05, 1950, 72 y.o.   MRN: [id number]  Chief Complaint  Patient presents with   New Patient (Initial Visit)    Saw Dr Warren [name] in the past. Referred by Dr Warren [name] for pulmonary hypertension. Heart cath was done 08/10/22. Pt destat on activity even after being treat for PAH. And he want to know why. Echocardogram was done 07/29/2022. Pt is on medication Uptravi, Sildenafil, and Opsumit. Pt is wanting to know if there is anything else that can be done for his breathing.     Referring provider: [name], MD  HPI:   72 y.o. man with pulmonary hypertension followed closely by advanced heart failure Dr. Gala [name] whom are seeing for evaluation of hypoxemia.  Multiple prior cardiology notes reviewed.  Multiple prior pulmonary notes via Dr. Sherene [name]  reviewed.  Patient quite hypoxemic.  Still desaturates.  Reviewed serial hemodynamics that showed improvement in hemodynamics from 20 21-20 24 on right heart cath in the setting of treatment of presumed idiopathic PAH.  On high-dose sildenafil, macitentan, selexipag 800 mg twice daily.  Overall dyspnea seems a little bit better when treating with these medicines compared to prior.  No hypoxemia noted today.  Reviewed his imaging Most recent CT scan chest high-resolution 11/11/2019 with similar hypoxemic show no ILD if retaining findings.  New, improving test in 2021 also negative.  Chest x-rays have been clear on serial review.  Questionaires / Pulmonary Flowsheets:   ACT:      No data to display          MMRC:     No data to display          Epworth:      No data to display          Tests:   FENO:  No results found for: "NITRICOXIDE"  PFT:    Latest Ref Rng & Units 08/31/2019   10:27 AM  PFT Results  FVC-Pre L 4.14   FVC-Predicted Pre % 94   FVC-Post L 4.11   FVC-Predicted Post % 93   Pre FEV1/FVC % % 69   Post FEV1/FCV % % 66   FEV1-Pre L 2.84   FEV1-Predicted Pre % 87    FEV1-Post L 2.73   DLCO uncorrected ml/min/mmHg 6.53   DLCO UNC% % 25   DLVA Predicted % 26   TLC L 5.91   TLC % Predicted % 86   RV % Predicted % 78   Personally reviewed interpreted as borderline very mild fixed obstruction, lung wide within the limits, DLCO severely reduced concerning for pulmonary vascular disease  WALK:     07/17/2020    3:30 PM 03/14/2020    2:49 PM 11/22/2019   11:23 AM 10/29/2019    4:25 PM  SIX MIN WALK  Supplimental Oxygen during Test? (L/min)   Yes Yes  O2 Flow Rate   2 L/min   Type   Continuous   2 Minute Oxygen Saturation % 86 % 78 %    2 Minute HR 110 114    4 Minute Oxygen Saturation % 86 % 85 %    4 Minute HR 112 108    6 Minute Oxygen Saturation % 85 % 78 %    6 Minute HR 112 115    Tech Comments:   (each lap 100 ft) after desat to 88% 2lpm o2 was increased to 3lpm cont and sats increased to 94%-- walked an additional 3  laps and sats at end were 91% 3lpm/no SOB/average pace/lmr Patient started walk at 87%, HR was 84. Was placed on 2L pulsed, O2 recovered to 90%. HR 85. Patient was able to complete 1/4 of a lap before O2 dropped to 84%. O2 was increased to 3L pulsed. O2 did not recover, increased to 4L pulsed. O2 never did recover on pulsed O2. Patient was then placed on 2L cont. O2 recovered to 90%, HR 81. Patient never complained of any SOB or chest pain.    Imaging: Personally reviewed and as per EMR discussion in this note No results found.  Lab Results: Personally reviewed CBC    Component Value Date/Time   WBC 8.0 08/10/2022 1229   RBC 4.76 08/10/2022 1229   HGB 14.3 08/10/2022 1447   HGB 13.6 08/10/2022 1447   HGB 18.2 (H) 12/10/2019 1012   HCT 42.0 08/10/2022 1447   HCT 40.0 08/10/2022 1447   HCT 52.4 (H) 12/10/2019 1012   PLT 187 08/10/2022 1229   PLT 200 12/10/2019 1012   MCV 92.2 08/10/2022 1229   MCV 95 12/10/2019 1012   MCH 31.3 08/10/2022 1229   MCHC 33.9 08/10/2022 1229   RDW 13.2 08/10/2022 1229   RDW 13.4 12/10/2019  1012   LYMPHSABS 1.8 10/29/2019 1539   MONOABS 1.2 (H) 10/29/2019 1539   EOSABS 0.2 10/29/2019 1539   BASOSABS 0.1 10/29/2019 1539    BMET    Component Value Date/Time   NA 144 08/10/2022 1447   NA 145 08/10/2022 1447   NA 139 12/10/2019 1012   K 3.7 08/10/2022 1447   K 3.5 08/10/2022 1447   CL 106 07/29/2022 0951   CO2 21 (L) 07/29/2022 0951   GLUCOSE 90 07/29/2022 0951   BUN 7 (L) 07/29/2022 0951   BUN 14 12/10/2019 1012   CREATININE 0.97 07/29/2022 0951   CALCIUM 9.2 07/29/2022 0951   GFRNONAA >60 07/29/2022 0951   GFRAA >60 02/07/2020 1225    BNP    Component Value Date/Time   BNP 35.0 07/29/2022 1010    ProBNP    Component Value Date/Time   PROBNP 145.0 (H) 10/29/2019 1539    Specialty Problems       Pulmonary Problems   Supplemental oxygen dependent   DOE (dyspnea on exertion)   Postinflammatory pulmonary fibrosis (HCC)    Onset of symptoms ? Summer of 2020 much worse p 2nd moderna March 2021  - Placed on 02 by Dr Warren [name] 09/2019 >>> see 02 study 10/29/2019 under chronic resp failure - Collagen vasc dz screen 10/29/2019 >>>  ESR 30  ANA pos 1:40  - HSP serololy  10/29/2019 >>> neg - HRCT chest 11/06/2019 >>> 1. No imaging findings to suggest interstitial lung disease. 2. Multiple small pulmonary nodules measuring 5 mm or less in size, stable compared to prior examinations dating back to 2016, considered definitively benign      Chronic respiratory failure with hypoxia (HCC)    Started on 02 by Dr Warren [name] 09/2019  - 10/29/2019   Walked approx 50 ft -@ slow  pace stopped due to desat to 83% only improved to 90% while walking on  2lpm continuous , not pulsed -  11/12/19 echo with bubble study done >>> -  11/22/2019   Walked after desat to 88% 2lpm o2 was increased to 3lpm cont and sats increased to 94%-- walked an additional 300 ft  and sats at end were 91% 3lpm/no SOB/average pace/   As of 11/22/2019  rec 2lpm  hs and titrate walking to > 90% but not a candidate for POC as  needed 3lpm cont to prevent desats in office       COPD GOLD 1     Quit "lightly" smoking in 2004 - PFT's  09/11/19   FEV1 2.84 (84 % ) ratio 0.69  p no % improvement from saba p 0 prior to study with DLCO  6.53 (25%) corrects to 1.09 (26%)  for alv volume and FV curve classically concave  - 10/29/2019  After extensive coaching inhaler device,  effectiveness =    99% with elipta > anoro x 2 week sample > not improved        Allergies  Allergen Reactions   Amlodipine Besylate     tachycardia   Cefdinir Diarrhea   Penicillins     Had once as child and had resp problems. Edema   Valsartan     Hyperkalemia    Immunization History  Administered Date(s) Administered   H1N1 04/10/2008   Influenza Whole 03/15/2007, 03/14/2008   Influenza,inj,quad, With Preservative 02/21/2018   Influenza-Unspecified 03/05/2021   Moderna Sars-Covid-2 Vaccination 07/19/2019, 08/17/2019   Pneumococcal Polysaccharide-23 12/28/2007   Pneumococcal-Unspecified 03/24/2018   Rsv, Bivalent, Protein Subunit Rsvpref,pf Verdis Frederickson) 03/04/2022    Past Medical History:  Diagnosis Date   CHF (congestive heart failure) (HCC)    Coronary artery disease    Hyperlipidemia    Hypertension     Tobacco History: Social History   Tobacco Use  Smoking Status Former   Current packs/day: 0.00   Types: Cigarettes   Quit date: 05/24/2002   Years since quitting: 20.6  Smokeless Tobacco Never   Counseling given: Not Answered   Continue to not smoke  Outpatient Encounter Medications as of 08/31/2022  Medication Sig   acetaminophen (TYLENOL) 500 MG tablet Take 1,000 mg by mouth every 6 (six) hours as needed for moderate pain or headache.   aspirin EC 81 MG tablet Take 1 tablet (81 mg total) by mouth daily.   ezetimibe (ZETIA) 10 MG tablet Take 10 mg by mouth daily.   OXYGEN Inhale 2-3 L into the lungs continuous. 2 L at rest and 3 L with activity   pantoprazole (PROTONIX) 40 MG tablet Take 40 mg by mouth daily.    UPTRAVI 800 MCG TABS TAKE 1 TABLET TWO TIMES DAILY.   [DISCONTINUED] montelukast (SINGULAIR) 10 MG tablet Take 1 tablet (10 mg total) by mouth at bedtime.   [DISCONTINUED] OPSUMIT 10 MG tablet TAKE 1 TABLET DAILY.   [DISCONTINUED] rosuvastatin (CRESTOR) 5 MG tablet TAKE 1 TABLET DAILY   [DISCONTINUED] sildenafil (REVATIO) 20 MG tablet TAKE 3 TABLETS BY MOUTH THREE TIMES DAILY   [DISCONTINUED] spironolactone (ALDACTONE) 25 MG tablet Take 0.5 tablets (12.5 mg total) by mouth daily.   [DISCONTINUED] Probiotic Product (PROBIOTIC DAILY PO) Take 1 capsule by mouth daily.   Facility-Administered Encounter Medications as of 08/31/2022  Medication   sodium chloride flush (NS) 0.9 % injection 10 mL     Review of Systems  Review of Systems  No chest pain exertion pain orthopnea or PND.  Comprehensive review of systems otherwise negative. Physical Exam  BP 132/70 (BP Location: Left Arm, Patient Position: Sitting, Cuff Size: Normal)   Pulse 100   Ht 5\' 9"  (1.753 m)   Wt 183 lb (83 kg)   SpO2 92%   BMI 27.02 kg/m   Wt Readings from Last 5 Encounters:  11/16/22 181 lb 12.8 oz (82.5 kg)  08/31/22 183 lb (  83 kg)  08/10/22 177 lb (80.3 kg)  07/29/22 182 lb 12.8 oz (82.9 kg)  02/19/22 178 lb 6.4 oz (80.9 kg)    BMI Readings from Last 5 Encounters:  11/16/22 26.46 kg/m  08/31/22 27.02 kg/m  08/10/22 26.14 kg/m  07/29/22 26.23 kg/m  02/19/22 25.60 kg/m     Physical Exam General: Sitting up, no distress Eyes: EOMI, no icterus Neck: Supple, no JVP Pulmonary: Distant, clear Cardiovascular: Distant, regular rhythm, no murmur Abdomen: Nondistended bowel sounds present MSK: No synovitis, no joint fusion Neuro: Normal gait, no weakness Psych: Normal mood, full affect   Assessment & Plan:   Pulmonary hypertension, group 1 presumed idiopathic: Closely followed by advanced heart failure.  Rheumatologic workup within normal limits.  Hemodynamics improved with treatment currently on  triple inhaled therapy macitentan, sildenafil 60 mg 3 times daily, selexipag 800 mg twice daily.  Further up titration hindered by symptoms side effects etc.  Low risk reveals score.  I recommend no changes.  Hypoxemic respiratory failure: Given review of images etc., suspect VQ mismatch in the setting of severe pulmonary hypertension.  No parenchymal evidence of disease that would contribute to hypoxemia.  Shunt is possible but none was seen on echocardiogram 2021.  Recommended keeping oxygen saturations 90% or higher.  Allergic rhinitis: Rhinorrhea, nasal congestion associated with pollen exposure, change of weather etc.  New prescription montelukast 10 mg nightly prescribed.  No follow-ups on file.   Karren Burly, MD 01/22/2023   This appointment required 45 minutes of patient care (this includes precharting, chart review, review of results, face-to-face care, etc.).

## 2023-01-27 DIAGNOSIS — M7022 Olecranon bursitis, left elbow: Secondary | ICD-10-CM | POA: Diagnosis not present

## 2023-01-27 DIAGNOSIS — J449 Chronic obstructive pulmonary disease, unspecified: Secondary | ICD-10-CM | POA: Diagnosis not present

## 2023-01-27 DIAGNOSIS — E782 Mixed hyperlipidemia: Secondary | ICD-10-CM | POA: Diagnosis not present

## 2023-01-27 DIAGNOSIS — G4733 Obstructive sleep apnea (adult) (pediatric): Secondary | ICD-10-CM | POA: Diagnosis not present

## 2023-01-27 DIAGNOSIS — J9611 Chronic respiratory failure with hypoxia: Secondary | ICD-10-CM | POA: Diagnosis not present

## 2023-01-27 DIAGNOSIS — Z23 Encounter for immunization: Secondary | ICD-10-CM | POA: Diagnosis not present

## 2023-01-27 DIAGNOSIS — I270 Primary pulmonary hypertension: Secondary | ICD-10-CM | POA: Diagnosis not present

## 2023-01-27 DIAGNOSIS — R7303 Prediabetes: Secondary | ICD-10-CM | POA: Diagnosis not present

## 2023-01-27 DIAGNOSIS — R809 Proteinuria, unspecified: Secondary | ICD-10-CM | POA: Diagnosis not present

## 2023-01-27 DIAGNOSIS — I2510 Atherosclerotic heart disease of native coronary artery without angina pectoris: Secondary | ICD-10-CM | POA: Diagnosis not present

## 2023-01-27 DIAGNOSIS — K219 Gastro-esophageal reflux disease without esophagitis: Secondary | ICD-10-CM | POA: Diagnosis not present

## 2023-01-31 ENCOUNTER — Telehealth (HOSPITAL_COMMUNITY): Payer: Self-pay | Admitting: Pharmacist

## 2023-01-31 ENCOUNTER — Encounter (HOSPITAL_COMMUNITY): Payer: Self-pay | Admitting: Internal Medicine

## 2023-01-31 ENCOUNTER — Other Ambulatory Visit (HOSPITAL_COMMUNITY): Payer: Self-pay

## 2023-01-31 ENCOUNTER — Ambulatory Visit (HOSPITAL_COMMUNITY)
Admission: RE | Admit: 2023-01-31 | Discharge: 2023-01-31 | Disposition: A | Discharge status: Home / Self Care | Payer: Medicare Other | Source: Ambulatory Visit | Attending: Internal Medicine | Admitting: Internal Medicine

## 2023-01-31 VITALS — BP 126/70 | HR 78 | Wt 186.0 lb

## 2023-01-31 DIAGNOSIS — E785 Hyperlipidemia, unspecified: Secondary | ICD-10-CM | POA: Diagnosis not present

## 2023-01-31 DIAGNOSIS — I5032 Chronic diastolic (congestive) heart failure: Secondary | ICD-10-CM

## 2023-01-31 DIAGNOSIS — I110 Hypertensive heart disease with heart failure: Secondary | ICD-10-CM | POA: Insufficient documentation

## 2023-01-31 DIAGNOSIS — G4733 Obstructive sleep apnea (adult) (pediatric): Secondary | ICD-10-CM | POA: Diagnosis not present

## 2023-01-31 DIAGNOSIS — J9610 Chronic respiratory failure, unspecified whether with hypoxia or hypercapnia: Secondary | ICD-10-CM | POA: Diagnosis not present

## 2023-01-31 DIAGNOSIS — I272 Pulmonary hypertension, unspecified: Secondary | ICD-10-CM

## 2023-01-31 DIAGNOSIS — I2510 Atherosclerotic heart disease of native coronary artery without angina pectoris: Secondary | ICD-10-CM | POA: Diagnosis not present

## 2023-01-31 DIAGNOSIS — R809 Proteinuria, unspecified: Secondary | ICD-10-CM | POA: Diagnosis not present

## 2023-01-31 DIAGNOSIS — Z79899 Other long term (current) drug therapy: Secondary | ICD-10-CM | POA: Diagnosis not present

## 2023-01-31 LAB — COMPREHENSIVE METABOLIC PANEL
ALT: 23 U/L (ref 0–44)
AST: 22 U/L (ref 15–41)
Albumin: 4.4 g/dL (ref 3.5–5.0)
Alkaline Phosphatase: 48 U/L (ref 38–126)
Anion gap: 9 (ref 5–15)
BUN: 13 mg/dL (ref 8–23)
CO2: 23 mmol/L (ref 22–32)
Calcium: 9.6 mg/dL (ref 8.9–10.3)
Chloride: 105 mmol/L (ref 98–111)
Creatinine, Ser: 1.04 mg/dL (ref 0.61–1.24)
GFR, Estimated: >60 mL/min (ref >60)
Glucose, Bld: 82 mg/dL (ref 70–99)
Potassium: 4.4 mmol/L (ref 3.5–5.1)
Sodium: 137 mmol/L (ref 135–145)
Total Bilirubin: 1 mg/dL (ref 0.3–1.2)
Total Protein: 7.4 g/dL (ref 6.5–8.1)

## 2023-01-31 LAB — CBC
HCT: 45.3 % (ref 39.0–52.0)
Hemoglobin: 15 g/dL (ref 13.0–17.0)
MCH: 30.6 pg (ref 26.0–34.0)
MCHC: 33.1 g/dL (ref 30.0–36.0)
MCV: 92.4 fL (ref 80.0–100.0)
Platelets: 203 10*3/uL (ref 150–400)
RBC: 4.9 MIL/uL (ref 4.22–5.81)
RDW: 13 % (ref 11.5–15.5)
WBC: 9.3 10*3/uL (ref 4.0–10.5)
nRBC: 0 % (ref 0.0–0.2)

## 2023-01-31 LAB — BRAIN NATRIURETIC PEPTIDE: B Natriuretic Peptide: 16.7 pg/mL (ref 0.0–100.0)

## 2023-01-31 NOTE — Patient Instructions (Signed)
Labs done today. We will contact you only if your labs are abnormal.  No medication changes were made. Please continue all current medications as prescribed.  Your physician has recommended that you have a pulmonary function test. Pulmonary Function Tests are a group of tests that measure how well air moves in and out of your lungs.  Your physician recommends that you schedule a follow-up appointment in: 3 months   If you have any questions or concerns before your next appointment please send [place] a message through [place] or call our office at [phone].    TO LEAVE A MESSAGE FOR THE NURSE SELECT OPTION 2, PLEASE LEAVE A MESSAGE INCLUDING: YOUR NAME DATE OF BIRTH CALL BACK NUMBER REASON FOR CALL**this is important as we prioritize the call backs  YOU WILL RECEIVE A CALL BACK THE SAME DAY AS LONG AS YOU CALL BEFORE 4:00 PM   Do the following things EVERYDAY: Weigh yourself in the morning before breakfast. Write it down and keep it in a log. Take your medicines as prescribed Eat low salt foods--Limit salt (sodium) to 2000 mg per day.  Stay as active as you can everyday Limit all fluids for the day to less than 2 liters   At the [facility], you and your health needs are our priority. As part of our continuing mission to provide you with exceptional heart care, we have created designated Provider Care Teams. These Care Teams include your primary Cardiologist (physician) and Advanced Practice Providers (APPs- Physician Assistants and Nurse Practitioners) who all work together to provide you with the care you need, when you need it.   You may see any of the following providers on your designated Care Team at your next follow up: Dr Arvilla Meres Dr Marca Ancona Dr. Marcos Eke, NP Robbie Lis, [place] [facility] [place], [place] Brynda Peon, NP Karle Plumber, PharmD   Please be sure to bring in all your medications bottles to every  appointment.    Thank you for choosing [facility]

## 2023-01-31 NOTE — Progress Notes (Signed)
Advanced Heart Failure Clinic Note   Date:  01/31/2023   ID:  Warren [name], DOB January 16, 1951, MRN [id number]  Location: Home  Provider location: [place] [facility] Type of Visit: Established patient  PCP:  [name], MD  Cardiologist:  [name], MD Primary HF: Warren [name]  Chief Complaint: Heart Failure follow-up   History of Present Illness:   DMIR RIEF is a 72 y.o. male with a hx of HTN, HLD, pulmonary hypertension referred by Dr. [name] for further management of PAH.    He has had a very complete w/u by Dr. [name]. He has had rather rapid onset of SOB associated with severe diffusion defect on PFTs with normal spirometry. CT negative for ILD. Echo showed normal EF with moderate to severe pHTN. VQ scan was negative.  His basic rheumatologic work-up was negative.    PFTs 4/21 FEV1 2.8 (87%) FVC 4.1 (94%) DLCO 25%   Underwent R/L cath 12/14/19  RHC 9/22  RA = 2 RV = 49/2 PA = 45/16 (28) PCW = 3 Fick cardiac output/index = 5.7/2.8 PVR = 4.4 WU Ao sat = 97% PA sat = 75%, 75% -> selex added  RHC 3/24 RA = 4 RV = 44/3  PA = 44/18 (30) PCW = 6 Fick cardiac output/index = 5.1/2.6 PVR = 4.7 Ao sat = 94% PA sat = 68%, 69%   Now on Macitentan 10 daily, sildenafil 60 tid, selexipag 800 bid.   Here today for f/u with his wife. Feels good. Wears 3L O2. Says he is doing alright. Continues to desaturate. Gets SOB with mild activity. Saw Dr. Judeth Horn. No edema, syncope, presyncope.   Studies  Echo 07/29/22 EF 60-65% RV normal. TR inadequate to assess RVSP  Echo 3/23 EF 60-65% RV normal TR inadequate to assess RVSP Echo 5/22 EF 50-55% RV normal Echo 05/01/20 EF 50-55% RV mildly dilated mild HK RVSP ~39mmHG  PAH therapy 1. Macitentan 10mg   Started 8/21 2. Sildenafil 60 tid Started 10/21 3. Selexipag 800 bid  - 01/02/20 261m with sats down to 79% - 03/14/20 272m (stopped for 1 min due to drop in O2) - 09/01/20 410m O2 Sat  ranged 80%-93%  on 3-8L oxygen     R/L cath 12/14/19   EF 55-60%   Prox RCA lesion is 20% stenosed. Mid LAD lesion is 40% stenosed.   Findings:   Ao = 120/71 (95) LV = 117/8 RA = 5 RV = 67/6 PA = 68/28 (44) PCW = 6 Fick cardiac output/index = 4.9/2.4 PVR = 7.8 WU FA sat = 92% PA sat = 69%, 70%   Past Medical History:  Diagnosis Date   CHF (congestive heart failure) (HCC)    Coronary artery disease    Hyperlipidemia    Hypertension    Past Surgical History:  Procedure Laterality Date   CARDIAC CATHETERIZATION     RIGHT HEART CATH N/A 01/22/2021   Procedure: RIGHT HEART CATH;  Surgeon: [name], MD;  [facility];  Service: Cardiovascular;  Laterality: N/A;   RIGHT HEART CATH N/A 08/10/2022   Procedure: RIGHT HEART CATH;  Surgeon: [name], MD;  [facility];  Service: Cardiovascular;  Laterality: N/A;   RIGHT/LEFT HEART CATH AND CORONARY ANGIOGRAPHY N/A 12/14/2019   Procedure: RIGHT/LEFT HEART CATH AND CORONARY ANGIOGRAPHY;  Surgeon: [name], MD;  [facility]: [facility];  Service: Cardiovascular;  Laterality:  N/A;     Current Outpatient Medications  Medication Sig Dispense Refill   acetaminophen (TYLENOL) 500 MG tablet Take 1,000 mg by mouth every 6 (six) hours as needed for moderate pain or headache.     aspirin EC 81 MG tablet Take 1 tablet (81 mg total) by mouth daily. 90 tablet 3   ezetimibe (ZETIA) 10 MG tablet Take 10 mg by mouth daily.     OPSUMIT 10 MG tablet TAKE 1 TABLET DAILY. 30 tablet 11   OXYGEN Inhale 2-3 L into the lungs continuous. 2 L at rest and 3 L with activity     pantoprazole (PROTONIX) 40 MG tablet Take 40 mg by mouth daily.     rosuvastatin (CRESTOR) 5 MG tablet TAKE 1 TABLET DAILY 90 tablet 0   sildenafil (REVATIO) 20 MG tablet TAKE 3 TABLETS BY MOUTH THREE TIMES DAILY 270 tablet 11   spironolactone (ALDACTONE) 25 MG tablet TAKE 1/2 TABLET(12.5MG      TOTAL) DAILY 45 tablet  1   UPTRAVI 800 MCG TABS TAKE 1 TABLET TWO TIMES DAILY. 60 tablet 11   No current facility-administered medications for this encounter.   Facility-Administered Medications Ordered in Other Encounters  Medication Dose Route Frequency Provider Last Rate Last Admin   sodium chloride flush (NS) 0.9 % injection 10 mL  10 mL Intravenous PRN [name], [name], MD   20 mL at 12/05/19 1125    Allergies:   Amlodipine besylate, Cefdinir, Penicillins, and Valsartan   Social History:  The patient  reports that he quit smoking about 20 years ago. His smoking use included cigarettes. He has never used smokeless tobacco. He reports that he does not currently use alcohol. He reports that he does not use drugs.   Family History:  The patient's family history includes Heart disease in his mother.   ROS:  Please see the history of present illness.   All other systems are personally reviewed and negative.   Vitals:   01/31/23 1522  BP: 126/70  Pulse: 78  SpO2: 95%  Weight: 84.4 kg (186 lb)    Exam:  General:  Ambulated into clinic.Wears O2 HEENT: flushing + telangiectasias Neck: supple. no JVD. Carotids 2+ bilat; no bruits. No lymphadenopathy or thryomegaly appreciated. Cor: PMI nondisplaced. Regular rate & rhythm. No rubs, gallops or murmurs. Lungs: clear Abdomen: soft, nontender, nondistended. No hepatosplenomegaly. No bruits or masses. Good bowel sounds. Extremities: no cyanosis, clubbing, rash, edema Neuro: alert & orientedx3, cranial nerves grossly intact. moves all 4 extremities w/o difficulty. Affect pleasant  Recent Labs: 07/29/2022: B Natriuretic Peptide 35.0; BUN 7; Creatinine, Ser 0.97 08/10/2022: Hemoglobin 14.3; Hemoglobin 13.6; Platelets 187; Potassium 3.7; Potassium 3.5; Sodium 144; Sodium 145  Personally reviewed   Wt Readings from Last 3 Encounters:  01/31/23 84.4 kg (186 lb)  11/16/22 82.5 kg (181 lb 12.8 oz)  08/31/22 83 kg (183 lb)      ASSESSMENT AND PLAN:  1.  Moderate to severe pulmonary HTN - PFT 08/31/2019: no obstruction/restriction, severe diffusion defect (DCLO 25%) - CT negative for ILD. ANA negative, anti-CCP negative, anti-RA negative - VQ scan negative CTEPH - Sleep study with mild PAH. Follows with Dr. Mayford Knife - WHO Group I - likely IPAH - Echo 9/20 LVEF normal. RV mild to moderate HK  - Echo 05/01/20 EF 50-55% RV mildly to moderately HK  - Echo 5/22 EF 50-55% RV normal - Echo 03/23 EF 60-65%, RV normal - Echo  07/29/22 EF 60-65% RV normal. TR inadequate to  assess RVSP Personally reviewed - RHC 3/24 PA 44/18 (30) PCW 6 Fick 5.1/2.6 PVR 4.7 on triple therapy - macitentan and sildenafil 60 tid and selexipag 800 bid - Stable NYHA III - 01/02/20 258m with sats down to 79% - 03/14/20 255m (stopped for 1 min due to drop in O2) - 09/01/20 431m O2 Sat ranged 80%-93%  on 3-8L oxygen - 07/09/21 39m O2 Sat ranged 87%-97% on 6-8L oxygen - 11/06/21 518 m O2 sat ranged 80-88% on 6L oxygen - 3/24 457.2 meters O2 Sat ranged 90%-85 % on 8L oxygen - Has significant residual PAH with elevated PVR and declining . Will add sotatercept. D/w Pharmd personally and application completed.   2. Chronic respiratory failure due to [facility] - continue to wear O2. Follow sats with pulse ok and titrate to keep sats >= 90%  - Has seen Dr. Marylu Lund - Will repeat PFTs   3.  CAD -Three-vessel calcification seen on recent lung CT - Minimal CAD by cath  -No s/s angina - Followed by Dr. [name]. Has been intolerant of statins.   4. OSA - Mild OSA 1/22: AHI 5.1/hr. Sats down to 77% - Followed by Dr. Mayford Knife  5. Proteinuria - Albumin/creatinine ratio 650 on labs at PCP's office in February now down to 29 - Continue spironolactone 12.5 mg daily - Would check UACR. May benefit from SGLT2i  Total time spent 45 minutes. Over half that time spent discussing above.    Signed, Arvilla Meres, MD  01/31/2023 3:46 PM  Advanced Heart Failure  Clinic [facility] [address] Heart and Vascular [place] [address] [phone] (office) [phone] (fax)

## 2023-01-31 NOTE — Telephone Encounter (Signed)
Patient Advocate Encounter   Received notification from [employer] that prior authorization for Warren [name] is required.   PA submitted on CoverMyMeds Key [id number] Status is pending   Will continue to follow.   Karle Plumber, PharmD, BCPS, BCCP, CPP Heart Failure Clinic Pharmacist [phone]

## 2023-01-31 NOTE — Telephone Encounter (Signed)
Advanced Heart Failure Patient Advocate Encounter  Prior Authorization for Warren [name] has been approved.    Effective dates:  05/24/2022 - 05/24/2023   Enrollment application faxed to [employer]. Application will be triaged to [facility] once approved.   Karle Plumber, PharmD, BCPS, BCCP, CPP Heart Failure Clinic Pharmacist [phone]

## 2023-02-07 NOTE — Telephone Encounter (Signed)
Received prescription request for Winrevair from [facility]. Faxed over new prescription. Specialty Pharmacy nurse will be reaching out to patient to do home visit soon. Will add Winrevair to medication list.   Karle Plumber, PharmD, BCPS, BCCP, CPP Heart Failure Clinic Pharmacist [phone]

## 2023-02-11 ENCOUNTER — Ambulatory Visit (HOSPITAL_COMMUNITY)
Admission: RE | Admit: 2023-02-11 | Discharge: 2023-02-11 | Disposition: A | Discharge status: Home / Self Care | Payer: Medicare Other | Source: Ambulatory Visit | Attending: Internal Medicine | Admitting: Internal Medicine

## 2023-02-11 DIAGNOSIS — I272 Pulmonary hypertension, unspecified: Secondary | ICD-10-CM | POA: Diagnosis not present

## 2023-02-11 DIAGNOSIS — Z08 Encounter for follow-up examination after completed treatment for malignant neoplasm: Secondary | ICD-10-CM | POA: Diagnosis not present

## 2023-02-11 DIAGNOSIS — D485 Neoplasm of uncertain behavior of skin: Secondary | ICD-10-CM | POA: Diagnosis not present

## 2023-02-11 DIAGNOSIS — Z85828 Personal history of other malignant neoplasm of skin: Secondary | ICD-10-CM | POA: Diagnosis not present

## 2023-02-11 DIAGNOSIS — L905 Scar conditions and fibrosis of skin: Secondary | ICD-10-CM | POA: Diagnosis not present

## 2023-02-11 LAB — PULMONARY FUNCTION TEST
DL/VA % pred: 19 %
DL/VA: 0.79 ml/min/mmHg/L
DLCO unc % pred: 19 %
DLCO unc: 5.11 ml/min/mmHg
FEF 25-75 Pre: 1.47 L/s
FEF2575-%Pred-Pre: 60 %
FEV1-%Pred-Pre: 85 %
FEV1-Pre: 2.75 L
FEV1FVC-%Pred-Pre: 92 %
FEV6-%Pred-Pre: 96 %
FEV6-Pre: 3.99 L
FEV6FVC-%Pred-Pre: 103 %
FVC-%Pred-Pre: 92 %
FVC-Pre: 4.07 L
Pre FEV1/FVC ratio: 68 %
Pre FEV6/FVC Ratio: 98 %
RV % pred: 82 %
RV: 2.03 L
TLC % pred: 91 %
TLC: 6.43 L

## 2023-02-14 ENCOUNTER — Telehealth (HOSPITAL_COMMUNITY): Payer: Self-pay | Admitting: Cardiology

## 2023-02-14 DIAGNOSIS — I5032 Chronic diastolic (congestive) heart failure: Secondary | ICD-10-CM

## 2023-02-14 DIAGNOSIS — I272 Pulmonary hypertension, unspecified: Secondary | ICD-10-CM

## 2023-02-14 NOTE — Telephone Encounter (Signed)
Pt received first dose of Winrevair on 9/20 and called to schedule post inj labs  Lab appt 9/26 @ 145 Order placed

## 2023-02-16 ENCOUNTER — Other Ambulatory Visit (HOSPITAL_COMMUNITY): Payer: Self-pay | Admitting: Internal Medicine

## 2023-02-17 ENCOUNTER — Ambulatory Visit (HOSPITAL_COMMUNITY)
Admission: RE | Admit: 2023-02-17 | Discharge: 2023-02-17 | Disposition: A | Discharge status: Home / Self Care | Payer: Medicare Other | Source: Ambulatory Visit | Attending: Internal Medicine | Admitting: Internal Medicine

## 2023-02-17 DIAGNOSIS — I5032 Chronic diastolic (congestive) heart failure: Secondary | ICD-10-CM | POA: Diagnosis not present

## 2023-02-17 LAB — CBC
HCT: 45.1 % (ref 39.0–52.0)
Hemoglobin: 15 g/dL (ref 13.0–17.0)
MCH: 30.1 pg (ref 26.0–34.0)
MCHC: 33.3 g/dL (ref 30.0–36.0)
MCV: 90.6 fL (ref 80.0–100.0)
Platelets: 203 10*3/uL (ref 150–400)
RBC: 4.98 MIL/uL (ref 4.22–5.81)
RDW: 13 % (ref 11.5–15.5)
WBC: 7.6 10*3/uL (ref 4.0–10.5)
nRBC: 0 % (ref 0.0–0.2)

## 2023-03-11 ENCOUNTER — Ambulatory Visit (HOSPITAL_COMMUNITY)
Admission: RE | Admit: 2023-03-11 | Discharge: 2023-03-11 | Disposition: A | Discharge status: Home / Self Care | Payer: Medicare Other | Source: Ambulatory Visit | Attending: Cardiology | Admitting: Cardiology

## 2023-03-11 DIAGNOSIS — I5032 Chronic diastolic (congestive) heart failure: Secondary | ICD-10-CM | POA: Diagnosis not present

## 2023-03-11 LAB — CBC
HCT: 46.2 % (ref 39.0–52.0)
Hemoglobin: 15.2 g/dL (ref 13.0–17.0)
MCH: 29.5 pg (ref 26.0–34.0)
MCHC: 32.9 g/dL (ref 30.0–36.0)
MCV: 89.7 fL (ref 80.0–100.0)
Platelets: 220 10*3/uL (ref 150–400)
RBC: 5.15 MIL/uL (ref 4.22–5.81)
RDW: 13.2 % (ref 11.5–15.5)
WBC: 8.1 10*3/uL (ref 4.0–10.5)
nRBC: 0 % (ref 0.0–0.2)

## 2023-03-17 DIAGNOSIS — Z23 Encounter for immunization: Secondary | ICD-10-CM | POA: Diagnosis not present

## 2023-03-22 ENCOUNTER — Other Ambulatory Visit (HOSPITAL_COMMUNITY): Payer: Self-pay

## 2023-03-25 DIAGNOSIS — Z08 Encounter for follow-up examination after completed treatment for malignant neoplasm: Secondary | ICD-10-CM | POA: Diagnosis not present

## 2023-03-25 DIAGNOSIS — Z85828 Personal history of other malignant neoplasm of skin: Secondary | ICD-10-CM | POA: Diagnosis not present

## 2023-03-25 DIAGNOSIS — L570 Actinic keratosis: Secondary | ICD-10-CM | POA: Diagnosis not present

## 2023-03-25 DIAGNOSIS — X32XXXD Exposure to sunlight, subsequent encounter: Secondary | ICD-10-CM | POA: Diagnosis not present

## 2023-04-01 ENCOUNTER — Ambulatory Visit (HOSPITAL_COMMUNITY)
Admission: RE | Admit: 2023-04-01 | Discharge: 2023-04-01 | Disposition: A | Discharge status: Home / Self Care | Payer: Medicare Other | Source: Ambulatory Visit | Attending: Cardiology | Admitting: Cardiology

## 2023-04-01 DIAGNOSIS — I5032 Chronic diastolic (congestive) heart failure: Secondary | ICD-10-CM | POA: Diagnosis not present

## 2023-04-01 LAB — CBC
HCT: 48.2 % (ref 39.0–52.0)
Hemoglobin: 15.8 g/dL (ref 13.0–17.0)
MCH: 29.4 pg (ref 26.0–34.0)
MCHC: 32.8 g/dL (ref 30.0–36.0)
MCV: 89.6 fL (ref 80.0–100.0)
Platelets: 248 10*3/uL (ref 150–400)
RBC: 5.38 MIL/uL (ref 4.22–5.81)
RDW: 13.3 % (ref 11.5–15.5)
WBC: 8.6 10*3/uL (ref 4.0–10.5)
nRBC: 0 % (ref 0.0–0.2)

## 2023-04-05 ENCOUNTER — Other Ambulatory Visit: Payer: Self-pay | Admitting: Cardiology

## 2023-04-19 ENCOUNTER — Encounter (HOSPITAL_COMMUNITY): Payer: Self-pay | Admitting: Internal Medicine

## 2023-04-19 ENCOUNTER — Ambulatory Visit (HOSPITAL_COMMUNITY)
Admission: RE | Admit: 2023-04-19 | Discharge: 2023-04-19 | Disposition: A | Discharge status: Home / Self Care | Payer: Medicare Other | Source: Ambulatory Visit | Attending: Internal Medicine | Admitting: Internal Medicine

## 2023-04-19 VITALS — BP 130/78 | HR 77 | Wt 182.4 lb

## 2023-04-19 DIAGNOSIS — I272 Pulmonary hypertension, unspecified: Secondary | ICD-10-CM | POA: Diagnosis not present

## 2023-04-19 DIAGNOSIS — G4733 Obstructive sleep apnea (adult) (pediatric): Secondary | ICD-10-CM

## 2023-04-19 DIAGNOSIS — I5032 Chronic diastolic (congestive) heart failure: Secondary | ICD-10-CM | POA: Insufficient documentation

## 2023-04-19 DIAGNOSIS — I2510 Atherosclerotic heart disease of native coronary artery without angina pectoris: Secondary | ICD-10-CM

## 2023-04-19 DIAGNOSIS — R9431 Abnormal electrocardiogram [ECG] [EKG]: Secondary | ICD-10-CM | POA: Insufficient documentation

## 2023-04-19 LAB — BASIC METABOLIC PANEL
Anion gap: 10 (ref 5–15)
BUN: 11 mg/dL (ref 8–23)
CO2: 25 mmol/L (ref 22–32)
Calcium: 9.6 mg/dL (ref 8.9–10.3)
Chloride: 104 mmol/L (ref 98–111)
Creatinine, Ser: 1.15 mg/dL (ref 0.61–1.24)
GFR, Estimated: >60 mL/min (ref >60)
Glucose, Bld: 85 mg/dL (ref 70–99)
Potassium: 4.3 mmol/L (ref 3.5–5.1)
Sodium: 139 mmol/L (ref 135–145)

## 2023-04-19 LAB — BRAIN NATRIURETIC PEPTIDE: B Natriuretic Peptide: 16 pg/mL (ref 0.0–100.0)

## 2023-04-19 NOTE — Progress Notes (Signed)
Advanced Heart Failure Clinic Note   Date:  04/19/2023   ID:  Warren [name], DOB November 20, 1950, MRN [id number]  Location: Home  Provider location: [place] [facility] Type of Visit: Established patient  PCP:  [name], MD  Cardiologist:  [name], MD Primary HF: [name]  Chief Complaint: Heart Failure follow-up   History of Present Illness:   Warren [name] is a 72 y.o. male with a hx of HTN, HLD, pulmonary hypertension referred by Dr. [name] for further management of PAH.    He has had a very complete w/u by Dr. [name]. He has had rather rapid onset of SOB associated with severe diffusion defect on PFTs with normal spirometry. CT negative for ILD. Echo showed normal EF with moderate to severe pHTN. VQ scan was negative.  His basic rheumatologic work-up was negative.    PFTs 4/21 FEV1 2.8 (87%) FVC 4.1 (94%) DLCO 25%   Underwent R/L cath 12/14/19  RHC 9/22  RA = 2 RV = 49/2 PA = 45/16 (28) PCW = 3 Fick cardiac output/index = 5.7/2.8 PVR = 4.4 WU Ao sat = 97% PA sat = 75%, 75% -> selex added  RHC 3/24 RA = 4 RV = 44/3  PA = 44/18 (30) PCW = 6 Fick cardiac output/index = 5.1/2.6 PVR = 4.7 Ao sat = 94% PA sat = 68%, 69%   Now on Macitentan 10 daily, sildenafil 60 tid, selexipag 800 bid, sotatercept 0.7 (started 9/24)  Here today for f/u with his wife. Feels good/  Wears 3L O2. Active with chores around the house. Recently started sotatercept. Says he feels better. No edema, syncope, presyncope. Wears bipap at night.   Hgb 15.0 -> 15.8  PLTs 203 -> 248    Studies  Echo 07/29/22 EF 60-65% RV normal. TR inadequate to assess RVSP  Echo 3/23 EF 60-65% RV normal TR inadequate to assess RVSP Echo 5/22 EF 50-55% RV normal Echo 05/01/20 EF 50-55% RV mildly dilated mild HK RVSP ~77mmHG  PAH therapy 1. Macitentan 10mg   Started 8/21 2. Sildenafil 60 tid Started 10/21 3. Selexipag 800 bid  - 01/02/20 226m with sats down to 79% -  03/14/20 248m (stopped for 1 min due to drop in O2) - 09/01/20 433m O2 Sat ranged 80%-93%  on 3-8L oxygen     R/L cath 12/14/19   EF 55-60%   Prox RCA lesion is 20% stenosed. Mid LAD lesion is 40% stenosed.   Findings:   Ao = 120/71 (95) LV = 117/8 RA = 5 RV = 67/6 PA = 68/28 (44) PCW = 6 Fick cardiac output/index = 4.9/2.4 PVR = 7.8 WU FA sat = 92% PA sat = 69%, 70%   Past Medical History:  Diagnosis Date   CHF (congestive heart failure) (HCC)    Coronary artery disease    Hyperlipidemia    Hypertension    Past Surgical History:  Procedure Laterality Date   CARDIAC CATHETERIZATION     RIGHT HEART CATH N/A 01/22/2021   Procedure: RIGHT HEART CATH;  Surgeon: [name], MD;  [facility];  Service: Cardiovascular;  Laterality: N/A;   RIGHT HEART CATH N/A 08/10/2022   Procedure: RIGHT HEART CATH;  Surgeon: [name], MD;  [facility]: [facility];  Service: Cardiovascular;  Laterality: N/A;   RIGHT/LEFT HEART CATH AND CORONARY ANGIOGRAPHY N/A 12/14/2019   Procedure: RIGHT/LEFT HEART CATH AND CORONARY ANGIOGRAPHY;  Surgeon: [name], MD;  [facility] INVAS[facility] CV LAB;  Service: Cardiovascular;  Laterality: N/A;     Current Outpatient Medications  Medication Sig Dispense Refill   acetaminophen (TYLENOL) 500 MG tablet Take 1,000 mg by mouth every 6 (six) hours as needed for moderate pain or headache.     aspirin EC 81 MG tablet Take 1 tablet (81 mg total) by mouth daily. 90 tablet 3   ezetimibe (ZETIA) 10 MG tablet Take 10 mg by mouth daily.     OPSUMIT 10 MG tablet TAKE 1 TABLET DAILY. 30 tablet 11   OXYGEN Inhale 2-3 L into the lungs continuous. 2 L at rest and 3 L with activity     pantoprazole (PROTONIX) 40 MG tablet Take 40 mg by mouth every other day.     rosuvastatin (CRESTOR) 5 MG tablet TAKE 1 TABLET DAILY 90 tablet 0   sildenafil (REVATIO) 20 MG tablet TAKE 3 TABLETS BY MOUTH THREE TIMES DAILY 270 tablet 11    sotatercept (WINREVAIR) KIT subcutaneous injection Inject 0.7 mg/kg into the skin every 21 ( twenty-one) days.     spironolactone (ALDACTONE) 25 MG tablet TAKE 1/2 TABLET(12.5MG      TOTAL) DAILY 45 tablet 1   UPTRAVI 800 MCG TABS TAKE 1 TABLET TWO TIMES DAILY. 60 tablet 11   No current facility-administered medications for this encounter.   Facility-Administered Medications Ordered in Other Encounters  Medication Dose Route Frequency Provider Last Rate Last Admin   sodium chloride flush (NS) 0.9 % injection 10 mL  10 mL Intravenous PRN [name], [name], MD   20 mL at 12/05/19 1125    Allergies:   Amlodipine besylate, Cefdinir, Penicillins, and Valsartan   Social History:  The patient  reports that he quit smoking about 20 years ago. His smoking use included cigarettes. He has never used smokeless tobacco. He reports that he does not currently use alcohol. He reports that he does not use drugs.   Family History:  The patient's family history includes Heart disease in his mother.   ROS:  Please see the history of present illness.   All other systems are personally reviewed and negative.   Vitals:   04/19/23 1434  BP: 130/78  Pulse: 77  SpO2: 95%  Weight: 82.7 kg (182 lb 6.4 oz)    Exam:  General:  Ambulated into clinic.Wears O2 HEENT: flushed + telangiectasias Neck: supple. no JVD. Carotids 2+ bilat; no bruits. No lymphadenopathy or thryomegaly appreciated. Cor: PMI nondisplaced. Regular rate & rhythm. No rubs, gallops or murmurs. Lungs: clear Abdomen: soft, nontender, nondistended. No hepatosplenomegaly. No bruits or masses. Good bowel sounds. Extremities: no cyanosis, clubbing, rash, edema Neuro: alert & orientedx3, cranial nerves grossly intact. moves all 4 extremities w/o difficulty. Affect pleasant   Recent Labs: 01/31/2023: ALT 23; B Natriuretic Peptide 16.7; BUN 13; Creatinine, Ser 1.04; Potassium 4.4; Sodium 137 04/01/2023: Hemoglobin 15.8; Platelets 248   Personally reviewed   Wt Readings from Last 3 Encounters:  04/19/23 82.7 kg (182 lb 6.4 oz)  01/31/23 84.4 kg (186 lb)  11/16/22 82.5 kg (181 lb 12.8 oz)    ECG: NSR 79 PAD No ST-T wave abnormalities. Personally reviewed   ASSESSMENT AND PLAN:  1. Moderate to severe pulmonary HTN - PFT 08/31/2019: no obstruction/restriction, severe diffusion defect (DCLO 25%) - CT negative for ILD. ANA negative, anti-CCP negative, anti-RA negative - VQ scan negative CTEPH - Sleep study with mild PAH. Follows with Dr. Mayford Knife - WHO Group I -  likely IPAH - Echo 9/20 LVEF normal. RV mild to moderate HK  - Echo 05/01/20 EF 50-55% RV mildly to moderately HK  - Echo 5/22 EF 50-55% RV normal - Echo 03/23 EF 60-65%, RV normal - Echo  07/29/22 EF 60-65% RV normal. TR inadequate to assess RVSP Personally reviewed - RHC 3/24 PA 44/18 (30) PCW 6 Fick 5.1/2.6 PVR 4.7 on triple therapy - macitentan and sildenafil 60 tid and selexipag 800 bid - Sotatercept added in 9/24 - Improved NYHA II  - Volume ok  - 01/02/20 271m with sats down to 79% - 03/14/20 254m (stopped for 1 min due to drop in O2) - 09/01/20 465m O2 Sat ranged 80%-93%  on 3-8L oxygen - 07/09/21 350m O2 Sat ranged 87%-97% on 6-8L oxygen - 11/06/21 518 m O2 sat ranged 80-88% on 6L oxygen - 3/24 457.2 meters O2 Sat ranged 90%-85 % on 8L oxygen - Check labs today  2. Chronic respiratory failure due to [facility] - continue to wear O2. Follow sats with pulse ok and titrate to keep sats >= 90%  - Follows with Dr. Marylu Lund   3.  CAD -Three-vessel calcification seen on recent lung CT - Minimal CAD by cath  - No s/s angina - Followed by Dr. [name]. Has been intolerant of statins.   4. OSA - Mild OSA 1/22: AHI 5.1/hr. Sats down to 77% - Followed by Dr. Mayford Knife  5. Proteinuria - Albumin/creatinine ratio 650 on labs at PCP's office in February now down to 29 - Continue spironolactone 12.5 mg daily - Resolved  Signed, Arvilla Meres, MD  04/19/2023 3:02 PM  Advanced Heart Failure Clinic [facility] [address] Heart and Vascular Center [place] [address] [phone] (office) [phone] (fax)

## 2023-04-19 NOTE — Progress Notes (Signed)
6 Min Walk Test Completed  Pt ambulated 1500 ft (457.2 m) O2 Sat ranged 84-95 on 2 L oxygen HR ranged 87-111 min

## 2023-04-19 NOTE — Patient Instructions (Signed)
Medication Changes:  No Changes In Medications at this time.   Lab Work:  Labs done today, your results will be available in MyChart, we will contact you for abnormal readings.  Follow-Up in: 6 months PLEASE CALL OUR OFFICE AROUND March 2025 TO GET SCHEDULED FOR YOUR APPOINTMENT. PHONE NUMBER IS [phone] OPTION 2   At the Advanced Heart Failure Clinic, you and your health needs are our priority. We have a designated team specialized in the treatment of Heart Failure. This Care Team includes your primary Heart Failure Specialized Cardiologist (physician), Advanced Practice Providers (APPs- Physician Assistants and Nurse Practitioners), and Pharmacist who all work together to provide you with the care you need, when you need it.   You may see any of the following providers on your designated Care Team at your next follow up:  Dr. Arvilla Meres Dr. Marca Ancona Dr. Dorthula Nettles Dr. Theresia Bough Tonye Becket, NP Robbie Lis, [place] [facility] [place], [place] Brynda Peon, NP [place] Lee, NP Karle Plumber, PharmD   Please be sure to bring in all your medications bottles to every appointment.   Need to Contact us:  If you have any questions or concerns before your next appointment please send [place] a message through [place] or call our office at [phone].    TO LEAVE A MESSAGE FOR THE NURSE SELECT OPTION 2, PLEASE LEAVE A MESSAGE INCLUDING: YOUR NAME DATE OF BIRTH CALL BACK NUMBER REASON FOR CALL**this is important as we prioritize the call backs  YOU WILL RECEIVE A CALL BACK THE SAME DAY AS LONG AS YOU CALL BEFORE 4:00 PM

## 2023-05-13 ENCOUNTER — Ambulatory Visit (HOSPITAL_COMMUNITY)
Admission: RE | Admit: 2023-05-13 | Discharge: 2023-05-13 | Disposition: A | Discharge status: Home / Self Care | Payer: Medicare Other | Source: Ambulatory Visit | Attending: Internal Medicine | Admitting: Internal Medicine

## 2023-05-13 DIAGNOSIS — I5032 Chronic diastolic (congestive) heart failure: Secondary | ICD-10-CM | POA: Insufficient documentation

## 2023-05-13 LAB — CBC
HCT: 49.7 % (ref 39.0–52.0)
Hemoglobin: 16.3 g/dL (ref 13.0–17.0)
MCH: 29.4 pg (ref 26.0–34.0)
MCHC: 32.8 g/dL (ref 30.0–36.0)
MCV: 89.7 fL (ref 80.0–100.0)
Platelets: 229 10*3/uL (ref 150–400)
RBC: 5.54 MIL/uL (ref 4.22–5.81)
RDW: 13.8 % (ref 11.5–15.5)
WBC: 8 10*3/uL (ref 4.0–10.5)
nRBC: 0 % (ref 0.0–0.2)

## 2023-05-26 ENCOUNTER — Telehealth (HOSPITAL_COMMUNITY): Payer: Self-pay | Admitting: Pharmacist

## 2023-05-26 ENCOUNTER — Other Ambulatory Visit (HOSPITAL_COMMUNITY): Payer: Self-pay

## 2023-05-26 NOTE — Telephone Encounter (Signed)
 Patient Advocate Encounter   Received notification from [facility] that prior authorization for sildenafil is required.   PA submitted on CoverMyMeds Key [id number] Status is pending   Will continue to follow.

## 2023-05-30 ENCOUNTER — Telehealth (HOSPITAL_COMMUNITY): Payer: Self-pay | Admitting: Pharmacy Technician

## 2023-05-30 ENCOUNTER — Other Ambulatory Visit (HOSPITAL_COMMUNITY): Payer: Self-pay

## 2023-05-30 ENCOUNTER — Telehealth (HOSPITAL_COMMUNITY): Payer: Self-pay | Admitting: Pharmacist

## 2023-05-30 NOTE — Telephone Encounter (Signed)
 Advanced Heart Failure Patient Advocate Encounter  Attaempted to submit PA for the following PAH Medications through CMM: Opsumit  (key [id number]), Uptravi  (key [id number]), Winrevair (key [id number]).  Received the following message from [facility]: Prior Authorizations are not required. Patient advocate confirmed by submitting test claims. All claims were able to be processed.    Tinnie Redman, PharmD, BCPS, BCCP, CPP Heart Failure Clinic Pharmacist [phone]

## 2023-05-30 NOTE — Telephone Encounter (Signed)
 Advanced Heart Failure Patient Advocate Encounter  Prior Authorization for sildenafil has been approved.    Effective dates: 05/27/23 through 05/25/24  Karle Plumber, PharmD, BCPS, BCCP, CPP Heart Failure Clinic Pharmacist [phone]

## 2023-06-01 ENCOUNTER — Other Ambulatory Visit (HOSPITAL_COMMUNITY): Payer: Self-pay

## 2023-06-01 NOTE — Telephone Encounter (Signed)
 Advanced Heart Failure Patient Advocate Encounter  Patient had concerns regarding Opsumit , Uptravi  and Winrevair coverage with new [employer]. All 3 medications are currently being covered by insurance at $2000/each. This is being filled as a transition benefit. This will allow the patient to receive a refill during the beginning of the year while PA's are being submitted. Unable to submit ePA, states PA is not required.   Called insurance at, [phone]. Submitted expedited PA requests for both Opsumit  and Uptravi .  Will follow up on determinations and submit Winrevair as well.

## 2023-06-01 NOTE — Telephone Encounter (Signed)
 Advanced Heart Failure Patient Advocate Encounter  Prior Authorization for Opsumit  has been approved.    Effective dates: 06/01/23 through 05/31/24  Advanced Heart Failure Patient Advocate Encounter  Prior Authorization for Uptravi  800 mcg has been approved.    Effective dates: 06/01/23 through 05/31/24  Called and updated patient. Will submit winrevair request as well.  Almarie JULIANNA Pa, CPhT

## 2023-06-02 ENCOUNTER — Other Ambulatory Visit (HOSPITAL_COMMUNITY): Payer: Self-pay | Admitting: Cardiology

## 2023-06-02 ENCOUNTER — Other Ambulatory Visit (HOSPITAL_COMMUNITY): Payer: Self-pay

## 2023-06-02 MED ORDER — SPIRONOLACTONE 25 MG PO TABS: 12.5000 mg | ORAL_TABLET | Freq: Every day | ORAL | 3 refills | Status: DC

## 2023-06-03 ENCOUNTER — Telehealth (HOSPITAL_COMMUNITY): Payer: Self-pay | Admitting: Pharmacy Technician

## 2023-06-03 ENCOUNTER — Other Ambulatory Visit (HOSPITAL_COMMUNITY): Payer: Medicare Other

## 2023-06-03 ENCOUNTER — Other Ambulatory Visit (HOSPITAL_COMMUNITY): Payer: Self-pay | Admitting: Cardiology

## 2023-06-03 MED ORDER — SPIRONOLACTONE 25 MG PO TABS: 12.5000 mg | ORAL_TABLET | Freq: Every day | ORAL | 3 refills | Status: DC

## 2023-06-03 NOTE — Telephone Encounter (Signed)
 Patient Advocate Encounter   Received notification from [facility] that prior authorization for Warren [name] is required.   PA submitted via fax. Status is pending   Will continue to follow.

## 2023-06-06 ENCOUNTER — Ambulatory Visit (HOSPITAL_COMMUNITY)
Admission: RE | Admit: 2023-06-06 | Discharge: 2023-06-06 | Disposition: A | Discharge status: Home / Self Care | Payer: Medicare Other | Source: Ambulatory Visit | Attending: Cardiology | Admitting: Cardiology

## 2023-06-06 DIAGNOSIS — I5032 Chronic diastolic (congestive) heart failure: Secondary | ICD-10-CM | POA: Insufficient documentation

## 2023-06-06 LAB — CBC
HCT: 49 % (ref 39.0–52.0)
Hemoglobin: 16.1 g/dL (ref 13.0–17.0)
MCH: 29.1 pg (ref 26.0–34.0)
MCHC: 32.9 g/dL (ref 30.0–36.0)
MCV: 88.6 fL (ref 80.0–100.0)
Platelets: 254 10*3/uL (ref 150–400)
RBC: 5.53 MIL/uL (ref 4.22–5.81)
RDW: 13.9 % (ref 11.5–15.5)
WBC: 7.5 10*3/uL (ref 4.0–10.5)
nRBC: 0 % (ref 0.0–0.2)

## 2023-06-07 ENCOUNTER — Other Ambulatory Visit (HOSPITAL_COMMUNITY): Payer: Self-pay

## 2023-06-14 ENCOUNTER — Other Ambulatory Visit (HOSPITAL_COMMUNITY): Payer: Self-pay

## 2023-06-14 NOTE — Telephone Encounter (Signed)
Advanced Heart Failure Patient Advocate Encounter  Prior Authorization for Kathie Dike has been approved.    PA# [id number] Effective dates: 06/03/23 through 06/02/24  Archer Asa, CPhT

## 2023-06-24 ENCOUNTER — Telehealth: Payer: Self-pay | Admitting: Cardiovascular Disease

## 2023-06-24 ENCOUNTER — Other Ambulatory Visit (HOSPITAL_COMMUNITY): Payer: Self-pay

## 2023-06-24 ENCOUNTER — Ambulatory Visit (HOSPITAL_COMMUNITY)
Admission: RE | Admit: 2023-06-24 | Discharge: 2023-06-24 | Disposition: A | Discharge status: Home / Self Care | Payer: Medicare Other | Source: Ambulatory Visit | Attending: Cardiology

## 2023-06-24 DIAGNOSIS — I5032 Chronic diastolic (congestive) heart failure: Secondary | ICD-10-CM | POA: Diagnosis not present

## 2023-06-24 LAB — CBC
HCT: 49.7 % (ref 39.0–52.0)
Hemoglobin: 16 g/dL (ref 13.0–17.0)
MCH: 28 pg (ref 26.0–34.0)
MCHC: 32.2 g/dL (ref 30.0–36.0)
MCV: 87 fL (ref 80.0–100.0)
Platelets: 213 10*3/uL (ref 150–400)
RBC: 5.71 MIL/uL (ref 4.22–5.81)
RDW: 14.5 % (ref 11.5–15.5)
WBC: 7.3 10*3/uL (ref 4.0–10.5)
nRBC: 0 % (ref 0.0–0.2)

## 2023-06-24 MED ORDER — ROSUVASTATIN CALCIUM 5 MG PO TABS: 5.0000 mg | ORAL_TABLET | Freq: Every day | ORAL | 1 refills | Status: DC

## 2023-06-24 MED ORDER — ROSUVASTATIN CALCIUM 5 MG PO TABS
5.0000 mg | ORAL_TABLET | Freq: Every day | ORAL | 1 refills | Status: DC
  Filled 2023-06-24: qty 90, 90d supply, fill #0

## 2023-06-24 NOTE — Telephone Encounter (Signed)
*  STAT* If patient is at the pharmacy, call can be transferred to refill team.   1. Which medications need to be refilled? (please list name of each medication and dose if known)   rosuvastatin (CRESTOR) 5 MG tablet     4. Which pharmacy/location (including street and city if local pharmacy) is medication to be sent to?    [address] - [place], [place] - [address] Phone: [phone]  Fax: [phone]       5. Do they need a 30 day or 90 day supply? 90

## 2023-06-24 NOTE — Addendum Note (Signed)
Addended by: Margaret Pyle D on: 06/24/2023 10:27 AM   Modules accepted: Orders

## 2023-06-24 NOTE — Telephone Encounter (Signed)
 Pt's medication was sent to pt's pharmacy as requested. Confirmation received.

## 2023-07-01 ENCOUNTER — Other Ambulatory Visit (HOSPITAL_COMMUNITY): Payer: Self-pay | Admitting: Internal Medicine

## 2023-07-12 ENCOUNTER — Telehealth (HOSPITAL_COMMUNITY): Payer: Self-pay | Admitting: Pharmacy Technician

## 2023-07-12 NOTE — Telephone Encounter (Signed)
 Advanced Heart Failure Patient Advocate Encounter  Received message that patient needs a QLE for Sildenafil. Called [employer] [phone]) and requested the form be sent to [place], via fax.   Will follow up.

## 2023-07-18 ENCOUNTER — Other Ambulatory Visit (HOSPITAL_COMMUNITY): Payer: Self-pay

## 2023-07-18 NOTE — Telephone Encounter (Signed)
 Advanced Heart Failure Patient Advocate Encounter  QLE for Sildenafil sent in to [facility] via fax.  Will follow up.

## 2023-07-19 NOTE — Telephone Encounter (Signed)
 Advanced Heart Failure Patient Advocate Encounter  Quantity Limit Exception for Sildenafil has been approved.    Effective dates: 07/19/23 through 07/17/24  Called and spoke with the patient.   Archer Asa, CPhT

## 2023-07-20 DIAGNOSIS — E782 Mixed hyperlipidemia: Secondary | ICD-10-CM | POA: Diagnosis not present

## 2023-07-20 DIAGNOSIS — Z125 Encounter for screening for malignant neoplasm of prostate: Secondary | ICD-10-CM | POA: Diagnosis not present

## 2023-07-20 DIAGNOSIS — R7303 Prediabetes: Secondary | ICD-10-CM | POA: Diagnosis not present

## 2023-07-21 DIAGNOSIS — I10 Essential (primary) hypertension: Secondary | ICD-10-CM | POA: Diagnosis not present

## 2023-07-25 LAB — LAB REPORT - SCANNED
A1c: 6.1
Albumin, Urine POC: 27.9
Creatinine, POC: 174.9 mg/dL
EGFR: 62

## 2023-07-29 ENCOUNTER — Telehealth: Payer: Self-pay | Admitting: Cardiology

## 2023-07-29 NOTE — Telephone Encounter (Signed)
 Pt requesting cb to discuss needing equipment for his Bi-pap. Needing prescriptions for all equipment

## 2023-07-29 NOTE — Telephone Encounter (Signed)
 08/04/23

## 2023-07-29 NOTE — Telephone Encounter (Signed)
 Pt scheduled 3/1/3 at 9:20

## 2023-08-01 NOTE — Telephone Encounter (Signed)
 Reached out to [place] and Warren [name] states the patients Rx is good for one year he just needs to call in for his supplies.

## 2023-08-01 NOTE — Telephone Encounter (Signed)
 Reached out to [place] spoke to Vernon who states the Rx is good for one year and the patient only needs to call in and order his supplies.

## 2023-08-02 ENCOUNTER — Encounter: Payer: Self-pay | Admitting: Cardiovascular Disease

## 2023-08-02 DIAGNOSIS — R7303 Prediabetes: Secondary | ICD-10-CM | POA: Diagnosis not present

## 2023-08-02 DIAGNOSIS — K219 Gastro-esophageal reflux disease without esophagitis: Secondary | ICD-10-CM | POA: Diagnosis not present

## 2023-08-02 DIAGNOSIS — M25512 Pain in left shoulder: Secondary | ICD-10-CM | POA: Diagnosis not present

## 2023-08-02 DIAGNOSIS — J449 Chronic obstructive pulmonary disease, unspecified: Secondary | ICD-10-CM | POA: Diagnosis not present

## 2023-08-02 DIAGNOSIS — E782 Mixed hyperlipidemia: Secondary | ICD-10-CM | POA: Diagnosis not present

## 2023-08-02 DIAGNOSIS — I2510 Atherosclerotic heart disease of native coronary artery without angina pectoris: Secondary | ICD-10-CM | POA: Diagnosis not present

## 2023-08-02 DIAGNOSIS — M7022 Olecranon bursitis, left elbow: Secondary | ICD-10-CM | POA: Diagnosis not present

## 2023-08-02 DIAGNOSIS — J9611 Chronic respiratory failure with hypoxia: Secondary | ICD-10-CM | POA: Diagnosis not present

## 2023-08-02 DIAGNOSIS — I10 Essential (primary) hypertension: Secondary | ICD-10-CM | POA: Diagnosis not present

## 2023-08-02 DIAGNOSIS — R809 Proteinuria, unspecified: Secondary | ICD-10-CM | POA: Diagnosis not present

## 2023-08-02 DIAGNOSIS — I270 Primary pulmonary hypertension: Secondary | ICD-10-CM | POA: Diagnosis not present

## 2023-08-02 DIAGNOSIS — G4733 Obstructive sleep apnea (adult) (pediatric): Secondary | ICD-10-CM | POA: Diagnosis not present

## 2023-08-03 NOTE — Progress Notes (Unsigned)
 Virtual Visit via Video Note   This visit type was conducted due to national recommendations for restrictions regarding the COVID-19 Pandemic (e.g. social distancing) in an effort to limit this patient's exposure and mitigate transmission in our community.  Due to his co-morbid illnesses, this patient is at least at moderate risk for complications without adequate follow up.  This format is felt to be most appropriate for this patient at this time.  All issues noted in this document were discussed and addressed.  A limited physical exam was performed with this format.  Please refer to the patient's chart for his consent to telehealth for [facility].   Date:  08/03/2023   ID:  Warren [name], DOB 02/01/1951, MRN [id number] The patient was identified using 2 identifiers.  Patient Location: Home Provider Location: Home Office   PCP:  [name], MD   [facility] Providers Cardiologist:  [name], MD     Evaluation Performed:  Follow-Up Visit  Chief Complaint:  OSA  History of Present Illness:    Warren [name] is a 73 y.o. male with a hx of CHF, CAD, HTN and HLD who was referred for  Home sleep study with [facility] for pulmonary HTN.  He was found to have very mild OSA with an AHI of 5/hr but increased to 11.9/hr during REM sleep. He had nocturnal hypoxemia with O2 sats < 88% for 32 min.  He was initially titrated on CPAP but due to ongoing respiratory events was changed to BiPAP at 9/5cm H2O.    He is doing well with his CPAP device and thinks that he has gotten used to it.  He tolerates the mask and feels the pressure is adequate.  Since going on CPAP he feels rested in the am and has no significant daytime sleepiness.  He denies any significant mouth or nasal dryness.  He has had a lot of nasal congestion recently related to significant allergies.  He has tried nasal steroid spray that did not work and he has tried allergy meds that did not work.  He does not think that he  snores. He says that some times his nose will bleed when he sneezes too much.    The patient does not have symptoms concerning for COVID-19 infection (fever, chills, cough, or new shortness of breath).    Past Medical History:  Diagnosis Date   CHF (congestive heart failure) (HCC)    Coronary artery disease    Hyperlipidemia    Hypertension    Past Surgical History:  Procedure Laterality Date   CARDIAC CATHETERIZATION     RIGHT HEART CATH N/A 01/22/2021   Procedure: RIGHT HEART CATH;  Surgeon: [name], MD;  [facility];  Service: Cardiovascular;  Laterality: N/A;   RIGHT HEART CATH N/A 08/10/2022   Procedure: RIGHT HEART CATH;  Surgeon: [name], MD;  Location: [facility];  Service: Cardiovascular;  Laterality: N/A;   RIGHT/LEFT HEART CATH AND CORONARY ANGIOGRAPHY N/A 12/14/2019   Procedure: RIGHT/LEFT HEART CATH AND CORONARY ANGIOGRAPHY;  Surgeon: [name], MD;  Location: [facility];  Service: Cardiovascular;  Laterality: N/A;     No outpatient medications have been marked as taking for the 08/04/23 encounter (Appointment) with Quintella Reichert, MD.     Allergies:   Amlodipine besylate, Cefdinir, Penicillins, and Valsartan   Social History   Tobacco Use   Smoking status: Former    Current packs/day:  0.00    Types: Cigarettes    Quit date: 05/24/2002    Years since quitting: 21.2   Smokeless tobacco: Never  Substance Use Topics   Alcohol use: Not Currently   Drug use: Never     Family Hx: The patient's family history includes Heart disease in his mother.  ROS:   Please see the history of present illness.     All other systems reviewed and are negative.   Prior CV studies:   The following studies were reviewed today:  HST, CPAP titration, PAP download  Labs/Other Tests and Data Reviewed:    EKG:  No ECG reviewed.  Recent Labs: 01/31/2023: ALT 23 04/19/2023: B Natriuretic Peptide 16.0; BUN 11;  Creatinine, Ser 1.15; Potassium 4.3; Sodium 139 06/24/2023: Hemoglobin 16.0; Platelets 213   Recent Lipid Panel Lab Results  Component Value Date/Time   CHOL 133 11/16/2008 12:57 AM   TRIG 120 11/16/2008 12:57 AM   HDL 41 11/16/2008 12:57 AM   CHOLHDL 3.2 Ratio 11/16/2008 12:57 AM   LDLCALC 68 11/16/2008 12:57 AM    Wt Readings from Last 3 Encounters:  04/19/23 182 lb 6.4 oz (82.7 kg)  01/31/23 186 lb (84.4 kg)  11/16/22 181 lb 12.8 oz (82.5 kg)     Risk Assessment/Calculations:          Objective:    Vital Signs:  There were no vitals taken for this visit.  Well nourished, well developed male in no acute distress. Well appearing, alert and conversant, regular work of breathing,  good skin color  Eyes- anicteric mouth- oral mucosa is pink  neuro- grossly intact skin- no apparent rash or lesions or cyanosis  ASSESSMENT & PLAN:    OSA - The patient is tolerating PAP therapy well without any problems. The PAP download performed by his DME was personally reviewed and interpreted by me today and showed an AHI of 1 /hr on BiPAP at 9/5 cm H2O with 100% compliance in using more than 4 hours nightly.  The patient has been using and benefiting from PAP use and will continue to benefit from therapy.    HTN -BP well controlled -Continue prescription drug management spironolactone 12.5 mg daily with as needed refills  3.  Allergic Rhinitis -I referred him at his last office visit to an allergist but it does not appear that that occurred     Medication Adjustments/Labs and Tests Ordered: Current medicines are reviewed at length with the patient today.  Concerns regarding medicines are outlined above.   Tests Ordered: No orders of the defined types were placed in this encounter.   Medication Changes: No orders of the defined types were placed in this encounter.   Follow Up:  Virtual Visit  1 year  Signed, Armanda Magic, MD  08/03/2023 6:24 PM     Medical [facility]  [facility]

## 2023-08-04 ENCOUNTER — Encounter: Payer: Self-pay | Admitting: Cardiology

## 2023-08-04 ENCOUNTER — Ambulatory Visit: Attending: Cardiology | Admitting: Cardiology

## 2023-08-04 VITALS — BP 122/62 | HR 76 | Ht 69.0 in | Wt 183.4 lb

## 2023-08-04 DIAGNOSIS — G4733 Obstructive sleep apnea (adult) (pediatric): Secondary | ICD-10-CM | POA: Diagnosis not present

## 2023-08-04 DIAGNOSIS — J302 Other seasonal allergic rhinitis: Secondary | ICD-10-CM | POA: Diagnosis not present

## 2023-08-04 DIAGNOSIS — I10 Essential (primary) hypertension: Secondary | ICD-10-CM

## 2023-08-04 NOTE — Patient Instructions (Addendum)
 Medication Instructions:  Your physician recommends that you continue on your current medications as directed. Please refer to the Current Medication list given to you today.  *If you need a refill on your cardiac medications before your next appointment, please call your pharmacy*   Lab Work: None ordered  If you have labs (blood work) drawn today and your tests are completely normal, you will receive your results only by: MyChart Message (if you have MyChart) OR A paper copy in the mail If you have any lab test that is abnormal or we need to change your treatment, we will call you to review the results.   Testing/Procedures: None ordered   Follow-Up: At [facility], you and your health needs are our priority.  As part of our continuing mission to provide you with exceptional heart care, we have created designated Provider Care Teams.  These Care Teams include your primary Cardiologist (physician) and Advanced Practice Providers (APPs -  Physician Assistants and Nurse Practitioners) who all work together to provide you with the care you need, when you need it.  We recommend signing up for the patient portal called "MyChart".  Sign up information is provided on this After Visit Summary.  MyChart is used to connect with patients for Virtual Visits (Telemedicine).  Patients are able to view lab/test results, encounter notes, upcoming appointments, etc.  Non-urgent messages can be sent to your provider as well.   To learn more about what you can do with MyChart, go to [email or web address].    Your next appointment:   12 month(s)  Provider:   Armanda Magic, MD (Sleep)     Other Instructions

## 2023-08-05 ENCOUNTER — Ambulatory Visit (INDEPENDENT_AMBULATORY_CARE_PROVIDER_SITE_OTHER): Admitting: Otolaryngology

## 2023-08-05 ENCOUNTER — Encounter (INDEPENDENT_AMBULATORY_CARE_PROVIDER_SITE_OTHER): Payer: Self-pay

## 2023-08-05 VITALS — BP 126/74 | HR 78 | Ht 69.0 in | Wt 179.0 lb

## 2023-08-05 DIAGNOSIS — J329 Chronic sinusitis, unspecified: Secondary | ICD-10-CM | POA: Diagnosis not present

## 2023-08-05 DIAGNOSIS — R040 Epistaxis: Secondary | ICD-10-CM

## 2023-08-05 DIAGNOSIS — J324 Chronic pansinusitis: Secondary | ICD-10-CM

## 2023-08-05 DIAGNOSIS — J343 Hypertrophy of nasal turbinates: Secondary | ICD-10-CM | POA: Diagnosis not present

## 2023-08-05 DIAGNOSIS — R0981 Nasal congestion: Secondary | ICD-10-CM

## 2023-08-07 DIAGNOSIS — J324 Chronic pansinusitis: Secondary | ICD-10-CM | POA: Insufficient documentation

## 2023-08-07 DIAGNOSIS — J343 Hypertrophy of nasal turbinates: Secondary | ICD-10-CM | POA: Insufficient documentation

## 2023-08-07 DIAGNOSIS — R040 Epistaxis: Secondary | ICD-10-CM | POA: Insufficient documentation

## 2023-08-07 NOTE — Progress Notes (Signed)
 Patient ID: Warren [name], male   DOB: April 21, 1951, 73 y.o.   MRN: [id number]  CC: Recurrent epistaxis, recurrent sinusitis  HPI:  Warren [name] is a 73 y.o. male who presents today complaining of recurrent epistaxis and frequent sinusitis.  His nasal bleeding is bilateral, but worse on the left side.  According to the patient, he has a history of pulmonary arterial hypertension.  The medications that treat his condition cause recurrent epistaxis and gum bleeding.  He also has a history of obstructive sleep apnea.  He currently uses BiPAP machine at night.  He is on oxygen 24/7.  He uses over-the-counter nasal allergy medications, including Nasacort.  He also complains of frequent recurrent sinusitis.  He has frequent facial pressure and nasal congestion.  Past Medical History:  Diagnosis Date   CHF (congestive heart failure) (HCC)    Coronary artery disease    Hyperlipidemia    Hypertension    OSA treated with BiPAP    mild OSA with an AHI of 5/hr but increased to 11.9/hr during REM sleep. He had nocturnal hypoxemia with O2 sats < 88% for 32 min.  On BiPAP at 9/5cm H2O    Past Surgical History:  Procedure Laterality Date   CARDIAC CATHETERIZATION     RIGHT HEART CATH N/A 01/22/2021   Procedure: RIGHT HEART CATH;  Surgeon: [name], MD;  [facility];  Service: Cardiovascular;  Laterality: N/A;   RIGHT HEART CATH N/A 08/10/2022   Procedure: RIGHT HEART CATH;  Surgeon: [name], MD;  [facility];  Service: Cardiovascular;  Laterality: N/A;   RIGHT/LEFT HEART CATH AND CORONARY ANGIOGRAPHY N/A 12/14/2019   Procedure: RIGHT/LEFT HEART CATH AND CORONARY ANGIOGRAPHY;  Surgeon: [name], MD;  [facility];  Service: Cardiovascular;  Laterality: N/A;    Family History  Problem Relation Age of Onset   Heart disease Mother     Social History:  reports that he quit smoking about 21 years ago. His smoking use included  cigarettes. He has never used smokeless tobacco. He reports that he does not currently use alcohol. He reports that he does not use drugs.  Allergies:  Allergies  Allergen Reactions   Amlodipine Besylate     tachycardia   Cefdinir Diarrhea   Penicillins     Had once as child and had resp problems. Edema   Valsartan     Hyperkalemia    Prior to Admission medications   Medication Sig Start Date End Date Taking? Authorizing Provider  acetaminophen (TYLENOL) 500 MG tablet Take 1,000 mg by mouth every 6 (six) hours as needed for moderate pain or headache.    [provider]  aspirin EC 81 MG tablet Take 1 tablet (81 mg total) by mouth daily. 01/12/19   O'NealRonnald Ramp, MD  chlorhexidine (PERIDEX) 0.12 % solution as needed (gum bleeding).    [provider]  ezetimibe (ZETIA) 10 MG tablet Take 10 mg by mouth daily.    [provider]  OPSUMIT 10 MG tablet TAKE 1 TABLET DAILY. 11/12/22   Bensimhon, Bevelyn Buckles, MD  OXYGEN Inhale 2-3 L into the lungs continuous. 2 L at rest and 3 L with activity    [provider]  pantoprazole (PROTONIX) 40 MG tablet Take 40 mg by mouth every other day.    [provider]  rosuvastatin (CRESTOR) 5 MG tablet Take 1 tablet (5 mg total) by mouth daily. 06/24/23  Sande Rives, MD  sildenafil (REVATIO) 20 MG tablet TAKE 3 TABLETS BY MOUTH THREE TIMES DAILY 09/06/22   Bensimhon, Bevelyn Buckles, MD  sotatercept [facility]) KIT subcutaneous injection Inject 0.7 mg/kg into the skin every 21 ( twenty-one) days.    [provider]  spironolactone (ALDACTONE) 25 MG tablet Take 0.5 tablets (12.5 mg total) by mouth daily. 06/03/23   Laurey Morale, MD  UPTRAVI 800 MCG TABS TAKE 1 TABLET TWO TIMES DAILY. 07/01/23   Bensimhon, Bevelyn Buckles, MD    Blood pressure 126/74, pulse 78, height 5\' 9"  (1.753 m), weight 179 lb (81.2 kg), SpO2 92%. Exam: General: Communicates without difficulty, well nourished, no acute distress.  Head: Normocephalic, no evidence injury, no tenderness, facial buttresses intact without stepoff. Face/sinus: No tenderness to palpation and percussion. Facial movement is normal and symmetric. Eyes: PERRL, EOMI. No scleral icterus, conjunctivae clear. Neuro: CN II exam reveals vision grossly intact.  No nystagmus at any point of gaze. Ears: Auricles well formed without lesions.  Ear canals are intact without mass or lesion.  No erythema or edema is appreciated.  The TMs are intact without fluid. Nose: External evaluation reveals normal support and skin without lesions.  Dorsum is intact.  Anterior rhinoscopy reveals congested mucosa over anterior aspect of inferior turbinates and intact septum.  No purulence noted. Oral:  Oral cavity and oropharynx are intact, symmetric, without erythema or edema.  Mucosa is moist without lesions. Neck: Full range of motion without pain.  There is no significant lymphadenopathy.  No masses palpable.  Thyroid bed within normal limits to palpation.  Parotid glands and submandibular glands equal bilaterally without mass.  Trachea is midline. Neuro:  CN 2-12 grossly intact.   Procedure:  Endoscopic control of recurrent left epistaxis. Indication:  Recurrent epistaxis  Description:  The left nasal cavity is sprayed with topical xylocaine and neo-synephrine.  After adequate anesthesia is achieved, the nasal cavity is examined with a 0 rigid endoscope.  A suction catheter is inserted into parallel with the 0 endoscope, and it is used to suction blood clots from the nasal cavity.  Several hypervascular areas are noted on the anterior and superior portion of the septum. Active bleeding is noted. A silver nitrate stick is inserted in parallel with the 0 endoscope.  It is used to repeatedly cauterized the hypervascular areas.  Good hemostasis is achieved.  Examination of the nasal cavities reveals bilateral inferior turbinate hypertrophy.  The patient tolerated the procedure well.     Assessment: 1.  Bilateral recurrent epistaxis, worse on the left side. 2.  Hypervascular areas are noted on the left anterior and superior nasal septum. 3.  Chronic rhinosinusitis, with nasal mucosal congestion and bilateral inferior turbinate hypertrophy.  Plan: 1.  The physical exam and nasal endoscopy findings are reviewed with the patient. 2.  Endoscopic cauterization of the left nasal septum. 3.  Nasal saline gel daily during the winter months. 4.  Sinus CT scan to evaluate for possible chronic sinusitis. 5.  The patient will return for reevaluation in 1 month.  Warren [name] 08/07/2023, 10:18 AM

## 2023-08-15 ENCOUNTER — Telehealth (INDEPENDENT_AMBULATORY_CARE_PROVIDER_SITE_OTHER): Payer: Self-pay | Admitting: Audiology

## 2023-08-15 NOTE — Telephone Encounter (Signed)
 Confirmed appt date and location with patient for 08/16/2023.

## 2023-08-16 ENCOUNTER — Ambulatory Visit (INDEPENDENT_AMBULATORY_CARE_PROVIDER_SITE_OTHER): Admitting: Audiology

## 2023-08-16 DIAGNOSIS — H903 Sensorineural hearing loss, bilateral: Secondary | ICD-10-CM

## 2023-08-17 ENCOUNTER — Ambulatory Visit (HOSPITAL_COMMUNITY)
Admission: RE | Admit: 2023-08-17 | Discharge: 2023-08-17 | Disposition: A | Discharge status: Home / Self Care | Source: Ambulatory Visit | Attending: Otolaryngology | Admitting: Otolaryngology

## 2023-08-17 DIAGNOSIS — J324 Chronic pansinusitis: Secondary | ICD-10-CM | POA: Insufficient documentation

## 2023-08-17 NOTE — Progress Notes (Signed)
  [address], [address] [place], [place] [address] [phone]  Audiological Evaluation    Name: Warren [name]     DOB:   04/09/1951      MRN:   [id number]                                                                                     Service Date: 08/17/2023     Accompanied by: unaccompanied     Patient comes today after Dr. Suszanne Conners, ENT sent a referral for a hearing evaluation due to concerns with hearing loss.   Symptoms Yes Details  Hearing loss  [x]  Last test was in 2020 at [facility]. [facility] . Thinks his hearing is getting worse.  Tinnitus  []    Ear pain/ infections/pressure  []    Balance problems  []    Noise exposure history  []    Previous ear surgeries  []    Family history of hearing loss  []    Amplification  []    Other  [x]  Wonders if his medical issues since 27 and taking vasodilators are having an effect on his hearing.Patient uses an oxygen cannula.     Otoscopy: Right ear: Clear external ear canals and notable landmarks visualized on the tympanic membrane. Left ear:  Clear external ear canals and notable landmarks visualized on the tympanic membrane.  Tympanometry: Right ear: Type A- Normal external ear canal volume with normal middle ear pressure and tympanic membrane compliance Left ear: Type A- Normal external ear canal volume with normal middle ear pressure and tympanic membrane compliance   Pure tone Audiometry: Both ears: Normal sloping to severe sensorineural hearing loss from 250 Hz - 8000 Hz.    Speech Audiometry: Right ear- Speech Reception Threshold (SRT) was obtained at 35 dBHL. Left ear-Speech Reception Threshold (SRT) was obtained at 40 dBHL.   Word Recognition Score Tested using NU-6 (MLV) Right ear: 80% was obtained at a presentation level of 85 dBHL with contralateral masking which is deemed as  good . Left ear: 76% was obtained at a presentation level of 85 dBHL with contralateral masking which is deemed as  fair.   The hearing test  results were completed under headphones and results are deemed to be of good reliability. Test technique:  conventional    Impression: There was a significant decline in pure-tone thresholds today when compared to the previous audiogram on file from 11-20-2018 (Dr[facility] clinic).   Recommendations: Follow up with ENT as per MD. Return for a hearing evaluation if concerns with hearing changes arise or per MD recommendation. Consider a communication needs assessment after medical clearance for hearing aids is obtained.(Consider a hearing spot check with inserts prior to fitting)   Addam Goeller MARIE LEROUX-MARTINEZ, AUD

## 2023-08-22 DIAGNOSIS — M25521 Pain in right elbow: Secondary | ICD-10-CM | POA: Diagnosis not present

## 2023-08-25 DIAGNOSIS — H52201 Unspecified astigmatism, right eye: Secondary | ICD-10-CM | POA: Diagnosis not present

## 2023-08-25 DIAGNOSIS — H524 Presbyopia: Secondary | ICD-10-CM | POA: Diagnosis not present

## 2023-08-25 DIAGNOSIS — H18593 Other hereditary corneal dystrophies, bilateral: Secondary | ICD-10-CM | POA: Diagnosis not present

## 2023-08-25 DIAGNOSIS — Z961 Presence of intraocular lens: Secondary | ICD-10-CM | POA: Diagnosis not present

## 2023-09-01 ENCOUNTER — Ambulatory Visit (INDEPENDENT_AMBULATORY_CARE_PROVIDER_SITE_OTHER): Admitting: Otolaryngology

## 2023-09-01 ENCOUNTER — Encounter (INDEPENDENT_AMBULATORY_CARE_PROVIDER_SITE_OTHER): Payer: Self-pay

## 2023-09-01 VITALS — Ht 69.0 in | Wt 182.0 lb

## 2023-09-01 DIAGNOSIS — H903 Sensorineural hearing loss, bilateral: Secondary | ICD-10-CM

## 2023-09-01 DIAGNOSIS — J343 Hypertrophy of nasal turbinates: Secondary | ICD-10-CM | POA: Diagnosis not present

## 2023-09-01 DIAGNOSIS — J310 Chronic rhinitis: Secondary | ICD-10-CM

## 2023-09-01 DIAGNOSIS — R0981 Nasal congestion: Secondary | ICD-10-CM | POA: Diagnosis not present

## 2023-09-01 DIAGNOSIS — R040 Epistaxis: Secondary | ICD-10-CM | POA: Diagnosis not present

## 2023-09-01 NOTE — Progress Notes (Signed)
 Patient ID: Warren [name], male   DOB: April 18, 1951, 73 y.o.   MRN: [id number]  Follow-up: Recurrent epistaxis, recurrent sinusitis, hearing loss  HPI: The patient is a 73 year old male who returns today for follow-up of his multiple medical issues, including recurrent epistaxis, recurrent sinusitis, and bilateral hearing loss.  At his last visit in March 2025, he was noted to have hypervascular areas on the left nasal septum.  He was treated with endoscopic cauterization of his left nasal septum.  He also underwent a sinus CT scan to evaluate for his recurrent sinusitis.  The CT scan showed no significant acute or chronic sinusitis.  Bilateral inferior turbinate hypertrophy was noted.  The patient also has a history of bilateral high-frequency sensorineural hearing loss.  He returns today reporting only 1 minor episode of nasal bleeding since his procedure.  He is able to breathe through both nostrils.  Currently he denies any facial pain, fever, or visual change.  He continues to have bilateral hearing difficulty, especially in noisy environments.  He denies any otalgia, otorrhea, or vertigo.  Exam: General: Communicates without difficulty, well nourished, no acute distress. Head: Normocephalic, no evidence injury, no tenderness, facial buttresses intact without stepoff. Face/sinus: No tenderness to palpation and percussion. Facial movement is normal and symmetric. Eyes: PERRL, EOMI. No scleral icterus, conjunctivae clear. Neuro: CN II exam reveals vision grossly intact.  No nystagmus at any point of gaze. Ears: Auricles well formed without lesions.  Ear canals are intact without mass or lesion.  No erythema or edema is appreciated.  The TMs are intact without fluid. Nose: External evaluation reveals normal support and skin without lesions.  Dorsum is intact.  Anterior rhinoscopy reveals congested mucosa over anterior aspect of inferior turbinates and intact septum.  No bleeding or hypervascular area noted.  Oral:  Oral cavity and oropharynx are intact, symmetric, without erythema or edema.  Mucosa is moist without lesions. Neck: Full range of motion without pain.  There is no significant lymphadenopathy.  No masses palpable.  Thyroid bed within normal limits to palpation.  Parotid glands and submandibular glands equal bilaterally without mass.  Trachea is midline. Neuro:  CN 2-12 grossly intact.   Assessment: 1.  Chronic rhinitis with nasal mucosal congestion and bilateral inferior turbinate hypertrophy.  No significant acute or chronic sinusitis was noted on his sinus CT scan. 2.  The patient is doing well status post cauterization of his left nasal septum.  No active bleeding or hypervascular area is noted today. 3.  Bilateral high-frequency sensorineural hearing loss, likely secondary to presbycusis.  Plan: 1.  The physical exam findings and the CT images are extensively reviewed with the patient. 2.  The patient is reassured that no acute or chronic infection is noted today. 3.  Continue with nasal ointment and humidifier as needed. 4.  Nasal saline irrigation is encouraged. 5.  The patient is a candidate for hearing amplification.  The hearing aid options are discussed. 6.  The patient will return for reevaluation in 1 year.

## 2023-09-03 DIAGNOSIS — J310 Chronic rhinitis: Secondary | ICD-10-CM | POA: Insufficient documentation

## 2023-09-03 DIAGNOSIS — H903 Sensorineural hearing loss, bilateral: Secondary | ICD-10-CM | POA: Insufficient documentation

## 2023-09-09 ENCOUNTER — Other Ambulatory Visit (HOSPITAL_COMMUNITY): Payer: Self-pay | Admitting: Internal Medicine

## 2023-09-23 DIAGNOSIS — L570 Actinic keratosis: Secondary | ICD-10-CM | POA: Diagnosis not present

## 2023-09-23 DIAGNOSIS — Z08 Encounter for follow-up examination after completed treatment for malignant neoplasm: Secondary | ICD-10-CM | POA: Diagnosis not present

## 2023-09-23 DIAGNOSIS — Z85828 Personal history of other malignant neoplasm of skin: Secondary | ICD-10-CM | POA: Diagnosis not present

## 2023-09-23 DIAGNOSIS — X32XXXD Exposure to sunlight, subsequent encounter: Secondary | ICD-10-CM | POA: Diagnosis not present

## 2023-09-27 ENCOUNTER — Other Ambulatory Visit (HOSPITAL_COMMUNITY): Payer: Self-pay | Admitting: Internal Medicine

## 2023-10-17 NOTE — Progress Notes (Incomplete)
 Advanced [facility] Clinic Note   Date:  10/18/2023   ID:  Warren [name], DOB [birth year], MRN [id number]  Location: Home  Provider location: [place] Type of Visit: Established patient  PCP:  Omie Bickers, MD  Cardiologist:  Oneil Bigness, MD Primary HF: Warren [name]  Chief Complaint: Heart Failure follow-up   History of Present Illness:   Warren [name] is a 73 y.o. male with a hx of HTN, HLD, pulmonary hypertension referred by Dr. Rolm Clos for further management of PAH.    He has had a very complete w/u by Dr. Rolm Clos. He has had rather rapid onset of SOB associated with severe diffusion defect on PFTs with normal spirometry. CT negative for ILD. Echo showed normal EF with moderate to severe pHTN. VQ scan was negative.  His basic rheumatologic work-up was negative.    PFTs 4/21 FEV1 2.8 (87%) FVC 4.1 (94%) DLCO 25%   Underwent R/L cath 12/14/19. Minimal non-obstructive CAD ( pRCA 20 mLAD 40) PA 68/28 (44) PCW 6 Fick 4.9/2.4 PVR 7.8  RHC 9/22  RA = 2 RV = 49/2 PA = 45/16 (28) PCW = 3 Fick cardiac output/index = 5.7/2.8 PVR = 4.4 WU Ao sat = 97% PA sat = 75%, 75% -> selex added  RHC 3/24 RA = 4 RV = 44/3  PA = 44/18 (30) PCW = 6 Fick cardiac output/index = 5.1/2.6 PVR = 4.7 Ao sat = 94% PA sat = 68%, 69%   Now on Macitentan  10 daily, sildenafil  60 tid, selexipag  800 bid, sotatercept 1.2 (started 9/24)  Here today for f/u with his wife. Feels good. Recently had nasal cauterization for epistaxis. Says he feels better with Sotatercept. Wears 3L O2. Remains very active around the house and in the yard. Says he has some gingival bleeding in the first week after taking the drug. No edema, syncope or presyncope. Struggling with   Hgb 15.0 -> 15.8 -> 16.0 PLTs 203 -> 248 -> 213  Echo today 10/18/2023 EF 60% RV normal. TR inadequate to assess RVSP .Personally reviewed    Studies  Echo 07/29/22 EF 60-65% RV normal. TR inadequate to  assess RVSP  Echo 3/23 EF 60-65% RV normal TR inadequate to assess RVSP Echo 5/22 EF 50-55% RV normal Echo 05/01/20 EF 50-55% RV mildly dilated mild HK RVSP ~56mmHG  PAH therapy 1. Macitentan  10mg   Started 8/21 2. Sildenafil  60 tid Started 10/21 3. Selexipag  800 bid 4. Sotatercept 1.2 (started 9/24)  - 01/02/20 252m with sats down to 79% - 03/14/20 241m (stopped for 1 min due to drop in O2) - 09/01/20 470m O2 Sat ranged 80%-93%  on 3-8L oxygen    Past Medical History:  Diagnosis Date   CHF (congestive heart failure) (HCC)    Coronary artery disease    Hyperlipidemia    Hypertension    OSA treated with BiPAP    mild OSA with an AHI of 5/hr but increased to 11.9/hr during REM sleep. He had nocturnal hypoxemia with O2 sats < 88% for 32 min.  On BiPAP at 9/5cm H2O   Past Surgical History:  Procedure Laterality Date   CARDIAC CATHETERIZATION     RIGHT HEART CATH N/A 01/22/2021   Procedure: RIGHT HEART CATH;  Surgeon: Mardell Shade, MD;  Location: [facility] INVASIVE CV LAB;  Service: Cardiovascular;  Laterality: N/A;   RIGHT HEART CATH N/A 08/10/2022   Procedure: RIGHT HEART CATH;  Surgeon: Julane Ny,  Rheta Celestine, MD;  Location: [facility] INVASIVE CV LAB;  Service: Cardiovascular;  Laterality: N/A;   RIGHT/LEFT HEART CATH AND CORONARY ANGIOGRAPHY N/A 12/14/2019   Procedure: RIGHT/LEFT HEART CATH AND CORONARY ANGIOGRAPHY;  Surgeon: Mardell Shade, MD;  Location: [facility] INVASIVE CV LAB;  Service: Cardiovascular;  Laterality: N/A;     Current Outpatient Medications  Medication Sig Dispense Refill   acetaminophen  (TYLENOL ) 500 MG tablet Take 1,000 mg by mouth every 6 (six) hours as needed for moderate pain or headache.     aspirin  EC 81 MG tablet Take 1 tablet (81 mg total) by mouth daily. 90 tablet 3   chlorhexidine (PERIDEX) 0.12 % solution as needed (gum bleeding).     ezetimibe (ZETIA) 10 MG tablet Take 10 mg by mouth daily.     OPSUMIT  10 MG tablet TAKE 1 TABLET DAILY. 30 tablet 11    OXYGEN  Inhale 2-3 L into the lungs continuous. 2 L at rest and 3 L with activity     pantoprazole (PROTONIX) 40 MG tablet Take 40 mg by mouth every other day.     rosuvastatin  (CRESTOR ) 5 MG tablet Take 1 tablet (5 mg total) by mouth daily. 90 tablet 1   sildenafil  (REVATIO ) 20 MG tablet TAKE THREE TABLETS BY MOUTH THREE TIMES DAILY 270 tablet 10   spironolactone  (ALDACTONE ) 25 MG tablet Take 0.5 tablets (12.5 mg total) by mouth daily. 45 tablet 3   UPTRAVI  800 MCG TABS TAKE 1 TABLET TWO TIMES DAILY. 60 tablet 11   WINREVAIR KIT subcutaneous injection Target Dose Inject 1.2 ml subcutaneously every 3 weeks. Dosing Interval is every 3 weeks. Winrevair Patient Weight: 84 kg 1 kit 11   No current facility-administered medications for this encounter.   Facility-Administered Medications Ordered in Other Encounters  Medication Dose Route Frequency Provider Last Rate Last Admin   sodium chloride  flush (NS) 0.9 % injection 10 mL  10 mL Intravenous PRN [name], Cathay Clonts, MD   20 mL at 12/05/19 1125    Allergies:   Amlodipine besylate, Cefdinir, Penicillins, and Valsartan   Social History:  The patient  reports that he quit smoking about 21 years ago. His smoking use included cigarettes. He has never used smokeless tobacco. He reports that he does not currently use alcohol. He reports that he does not use drugs.   Family History:  The patient's family history includes Heart disease in his mother.   ROS:  Please see the history of present illness.   All other systems are personally reviewed and negative.   Vitals:   10/18/23 0838  BP: 116/74  Pulse: 72  SpO2: 93%  Weight: 83.1 kg (183 lb 3.2 oz)  Height: 5' 9.5" (1.765 m)     Exam:  General:  Well appearing. No resp difficulty HEENT: normal Flushed. Wearing O2. + telangiectacias Neck: supple. no JVD. Carotids 2+ bilat; no bruits. No lymphadenopathy or thryomegaly appreciated. Cor: PMI nondisplaced. Regular rate & rhythm. No rubs, gallops  or murmurs. Lungs: clear Abdomen: soft, nontender, nondistended. No hepatosplenomegaly. No bruits or masses. Good bowel sounds. Extremities: no cyanosis, clubbing, rash, edema Neuro: alert & orientedx3, cranial nerves grossly intact. moves all 4 extremities w/o difficulty. Affect pleasant  Recent Labs: 01/31/2023: ALT 23 04/19/2023: B Natriuretic Peptide 16.0; BUN 11; Creatinine, Ser 1.15; Potassium 4.3; Sodium 139 06/24/2023: Hemoglobin 16.0; Platelets 213  Personally reviewed   Wt Readings from Last 3 Encounters:  10/18/23 83.1 kg (183 lb 3.2 oz)  09/01/23 82.6 kg (182 lb)  08/05/23 81.2 kg (179 lb)    ASSESSMENT AND PLAN:  1. Moderate to severe pulmonary HTN - PFT 08/31/2019: no obstruction/restriction, severe diffusion defect (DCLO 25%) - CT negative for ILD. ANA negative, anti-CCP negative, anti-RA negative - VQ scan negative CTEPH - Sleep study with mild PAH. Follows with Dr. Micael Adas - WHO Group I - likely IPAH - Echo 9/20 LVEF normal. RV mild to moderate HK  - Echo 05/01/20 EF 50-55% RV mildly to moderately HK  - Echo 5/22 EF 50-55% RV normal - Echo 03/23 EF 60-65%, RV normal - Echo  07/29/22 EF 60-65% RV normal. TR inadequate to assess RVSP Personally reviewed - RHC 3/24 PA 44/18 (30) PCW 6 Fick 5.1/2.6 PVR 4.7 on triple therapy - macitentan  and sildenafil  60 tid and selexipag  800 bid - Sotatercept added in 9/24 - Echo today 10/18/23: EF 60% RV normal. TR insufficient to assess RVSP Personally reviewed -Doing well   - Volume ok  - 01/02/20 22m with sats down to 79% - 03/14/20 268m (stopped for 1 min due to drop in O2) - 09/01/20 487m O2 Sat ranged 80%-93%  on 3-8L oxygen  - 07/09/21 380m O2 Sat ranged 87%-97% on 6-8L oxygen  - 11/06/21 518 m O2 sat ranged 80-88% on 6L oxygen  - 3/24 457.2 meters O2 Sat ranged 90%-85 % on 8L oxygen  - 04/19/23 457.22m O2 Sat ranged 84-95 on 2 L oxygen   - Due for labs and today  - Reveal Lite risk score 2    Addendum: Pt ambulated 1322ft (394.22m) O2 Sat ranged 94-82 on 6L oxygen  HR ranged 106-84  2. Chronic respiratory failure due to [facility] - continue to wear O2. Follow sats with pulse ox and titrate to keep sats >= 90%  - Follows with Dr. Fate Honor - Continue home O2   3.  CAD -Three-vessel calcification seen on recent lung CT - Minimal CAD by cath  - No s/s angina - Followed by Dr. Rolm Clos. Has been intolerant of statins.   4. OSA - Mild OSA 1/22: AHI 5.1/hr. Sats down to 77% - Followed by Dr. Micael Adas  5. Proteinuria - Albumin /creatinine ratio 650 on labs at PCP's office in February now down to 29 - Continue spironolactone  12.5 mg daily - Resolved with spiro  6. Pre-operative CV examination - pulmonary pressures and related RV strain have essentially normalized with PAH therapy - low to moderate risk for knee replacement and/or colonoscopy - can proceed without further cardiac evaluation   Signed, Jules Oar, MD  10/18/2023 9:05 AM  [facility] [address] [facility] [place] [address] [phone] (office) [phone] (fax)

## 2023-10-18 ENCOUNTER — Encounter (HOSPITAL_COMMUNITY): Payer: Self-pay | Admitting: Internal Medicine

## 2023-10-18 ENCOUNTER — Other Ambulatory Visit (HOSPITAL_COMMUNITY): Payer: Self-pay | Admitting: Internal Medicine

## 2023-10-18 ENCOUNTER — Ambulatory Visit (HOSPITAL_COMMUNITY)
Admission: RE | Admit: 2023-10-18 | Discharge: 2023-10-18 | Disposition: A | Discharge status: Home / Self Care | Source: Ambulatory Visit | Attending: Internal Medicine | Admitting: Internal Medicine

## 2023-10-18 ENCOUNTER — Ambulatory Visit (HOSPITAL_BASED_OUTPATIENT_CLINIC_OR_DEPARTMENT_OTHER)
Admission: RE | Admit: 2023-10-18 | Discharge: 2023-10-18 | Disposition: A | Discharge status: Home / Self Care | Source: Ambulatory Visit | Attending: Internal Medicine | Admitting: Internal Medicine

## 2023-10-18 VITALS — BP 116/74 | HR 72 | Ht 69.5 in | Wt 183.2 lb

## 2023-10-18 DIAGNOSIS — I2510 Atherosclerotic heart disease of native coronary artery without angina pectoris: Secondary | ICD-10-CM

## 2023-10-18 DIAGNOSIS — J9610 Chronic respiratory failure, unspecified whether with hypoxia or hypercapnia: Secondary | ICD-10-CM | POA: Insufficient documentation

## 2023-10-18 DIAGNOSIS — R809 Proteinuria, unspecified: Secondary | ICD-10-CM | POA: Insufficient documentation

## 2023-10-18 DIAGNOSIS — I358 Other nonrheumatic aortic valve disorders: Secondary | ICD-10-CM | POA: Insufficient documentation

## 2023-10-18 DIAGNOSIS — I272 Pulmonary hypertension, unspecified: Secondary | ICD-10-CM | POA: Diagnosis not present

## 2023-10-18 DIAGNOSIS — E785 Hyperlipidemia, unspecified: Secondary | ICD-10-CM | POA: Diagnosis not present

## 2023-10-18 DIAGNOSIS — I5032 Chronic diastolic (congestive) heart failure: Secondary | ICD-10-CM

## 2023-10-18 DIAGNOSIS — G4733 Obstructive sleep apnea (adult) (pediatric): Secondary | ICD-10-CM | POA: Diagnosis not present

## 2023-10-18 DIAGNOSIS — I110 Hypertensive heart disease with heart failure: Secondary | ICD-10-CM | POA: Insufficient documentation

## 2023-10-18 DIAGNOSIS — Z79899 Other long term (current) drug therapy: Secondary | ICD-10-CM | POA: Insufficient documentation

## 2023-10-18 LAB — ECHOCARDIOGRAM COMPLETE
AR max vel: 2.36 cm2
AV Area VTI: 2.86 cm2
AV Area mean vel: 2.36 cm2
AV Mean grad: 2 mmHg
AV Peak grad: 3.1 mmHg
Ao pk vel: 0.88 m/s
Area-P 1/2: 4.63 cm2
S' Lateral: 3.5 cm

## 2023-10-18 LAB — COMPREHENSIVE METABOLIC PANEL WITH GFR
ALT: 19 U/L (ref 0–44)
AST: 22 U/L (ref 15–41)
Albumin: 4.1 g/dL (ref 3.5–5.0)
Alkaline Phosphatase: 54 U/L (ref 38–126)
Anion gap: 7 (ref 5–15)
BUN: 14 mg/dL (ref 8–23)
CO2: 27 mmol/L (ref 22–32)
Calcium: 9.5 mg/dL (ref 8.9–10.3)
Chloride: 105 mmol/L (ref 98–111)
Creatinine, Ser: 1.16 mg/dL (ref 0.61–1.24)
GFR, Estimated: >60 mL/min (ref >60)
Glucose, Bld: 94 mg/dL (ref 70–99)
Potassium: 4.5 mmol/L (ref 3.5–5.1)
Sodium: 139 mmol/L (ref 135–145)
Total Bilirubin: 0.8 mg/dL (ref 0.0–1.2)
Total Protein: 7.3 g/dL (ref 6.5–8.1)

## 2023-10-18 LAB — CBC
HCT: 50.8 % (ref 39.0–52.0)
Hemoglobin: 16.2 g/dL (ref 13.0–17.0)
MCH: 26.3 pg (ref 26.0–34.0)
MCHC: 31.9 g/dL (ref 30.0–36.0)
MCV: 82.3 fL (ref 80.0–100.0)
Platelets: 259 10*3/uL (ref 150–400)
RBC: 6.17 MIL/uL — ABNORMAL HIGH (ref 4.22–5.81)
RDW: 18.8 % — ABNORMAL HIGH (ref 11.5–15.5)
WBC: 9.5 10*3/uL (ref 4.0–10.5)
nRBC: 0 % (ref 0.0–0.2)

## 2023-10-18 LAB — BRAIN NATRIURETIC PEPTIDE: B Natriuretic Peptide: 8.8 pg/mL (ref 0.0–100.0)

## 2023-10-18 NOTE — Progress Notes (Signed)
 6 Min Walk Test Completed  Pt ambulated 1318ft (394.79m) O2 Sat ranged 94-82 on 6L oxygen  HR ranged 106-84

## 2023-10-18 NOTE — Patient Instructions (Signed)
  Lab Work: Labs done today, your results will be available in MyChart, we will contact you for abnormal readings.  Follow-Up in: 6 months PLEASE CALL OUR OFFICE AROUND SEPTEMBER TO GET SCHEDULED FOR YOUR APPOINTMENT. PHONE NUMBER IS [phone] OPTION 2   At the [facility] Clinic, you and your health needs are our priority. We have a designated team specialized in the treatment of Heart Failure. This Care Team includes your primary Heart Failure Specialized Cardiologist (physician), Advanced Practice Providers (APPs- Physician Assistants and Nurse Practitioners), and Pharmacist who all work together to provide you with the care you need, when you need it.   You may see any of the following providers on your designated Care Team at your next follow up:  Dr. Jules Oar Dr. Peder Bourdon Dr. Alwin Baars Dr. Judyth Nunnery Nieves Bars, NP Ruddy Corral, [place] [facility] [place], [place] Dennise Fitz, NP [place] Lee, NP Luster Salters, PharmD   Please be sure to bring in all your medications bottles to every appointment.   Need to Contact Us :  If you have any questions or concerns before your next appointment please send us  a message through [place] or call our office at [phone].    TO LEAVE A MESSAGE FOR THE NURSE SELECT OPTION 2, PLEASE LEAVE A MESSAGE INCLUDING: YOUR NAME DATE OF BIRTH CALL BACK NUMBER REASON FOR CALL**this is important as we prioritize the call backs  YOU WILL RECEIVE A CALL BACK THE SAME DAY AS LONG AS YOU CALL BEFORE 4:00 PM

## 2023-10-20 NOTE — Progress Notes (Unsigned)
 Cardiology Office Note:  .   Date:  10/21/2023  ID:  Warren [name], DOB Jul 06, 1950, MRN [id number] PCP: Omie Bickers, MD  [facility] Providers Cardiologist:  Oneil Bigness, MD }   History of Present Illness: .    Chief Complaint  Patient presents with   Follow-up    Warren [name] is a 73 y.o. male with history of non-obstructive CAD, pHTN, HLD who presents for follow-up.    History of Present Illness   Warren [name] "Warren [name]" is a 73 year old male with nonobstructive CAD and idiopathic pulmonary hypertension who presents for follow-up.  His most recent echocardiogram showed normal right heart pressures with no evidence of right heart failure. The right side of his heart is back to normal, and although the pressures could not be calculated, they are suspected to be in a normal range.  He is on triple therapy for idiopathic pulmonary hypertension, including Winrevir injections every three weeks. He notes significant improvement in his symptoms compared to previous treatments with Opsumit , sildenafil , and Uptravi , stating that he no longer feels like he is 'struggling all the time.'  A recent lab test showed an elevated red blood cell count, but his platelets and hemoglobin are within acceptable ranges. He is more active and reports walking more due to feeling better. No swelling in his legs.  His nonobstructive CAD is managed with aspirin  81 mg daily, Crestor  5 mg daily, and Zetia 10 mg daily. His most recent LDL cholesterol level was 41. He has no symptoms of angina and his blood pressure is well controlled.          Problem List 1. Idiopathic pulmonary hypertension  -RHC 08/10/2022: mPAP 62m, PVR 4.7 WU 2. HLD -T chol 92, HDL 32, LDL 41, TG 101 3. HTN 4.  CAD, nonobstructive  -LHC 12/14/2019: 20% RCA 40% LAD    ROS: All other ROS reviewed and negative. Pertinent positives noted in the HPI.     Studies Reviewed: Warren [name]       TTE 10/18/2023  1. Left ventricular ejection  fraction, by estimation, is 55 to 60%. The  left ventricle has normal function. The left ventricle has no regional  wall motion abnormalities. Left ventricular diastolic parameters are  consistent with Grade I diastolic  dysfunction (impaired relaxation).   2. Right ventricular systolic function is normal. The right ventricular  size is normal. Tricuspid regurgitation signal is inadequate for assessing  PA pressure.   3. The mitral valve is grossly normal. Trivial mitral valve  regurgitation.   4. The aortic valve was not well visualized. Aortic valve regurgitation  is not visualized. Aortic valve sclerosis is present, with no evidence of  aortic valve stenosis.   5. The inferior vena cava is normal in size with greater than 50%  respiratory variability, suggesting right atrial pressure of 3 mmHg.  Physical Exam:   VS:  BP 127/78 (BP Location: Left Arm, Patient Position: Sitting, Cuff Size: Normal)   Pulse 88   Ht 5\' 10"  (1.778 m)   Wt 184 lb 6.4 oz (83.6 kg)   SpO2 91%   BMI 26.46 kg/m    Wt Readings from Last 3 Encounters:  10/21/23 184 lb 6.4 oz (83.6 kg)  10/18/23 183 lb 3.2 oz (83.1 kg)  09/01/23 182 lb (82.6 kg)    GEN: Well nourished, well developed in no acute distress NECK: No JVD; No carotid bruits CARDIAC: RRR, no murmurs, rubs, gallops RESPIRATORY:  Clear  to auscultation without rales, wheezing or rhonchi  ABDOMEN: Soft, non-tender, non-distended EXTREMITIES:  No edema; No deformity  ASSESSMENT AND PLAN: .   Assessment and Plan    Idiopathic pulmonary hypertension Echocardiogram showed normal right heart pressures, no right heart failure. On triple therapy with significant symptom improvement. Prefers to continue Winrevir for a year before hip replacement.  - Coordinate with advanced heart failure clinic for ongoing management.  Nonobstructive coronary artery disease No angina symptoms. LDL cholesterol at 41 mg/dL. On aspirin , Crestor , and Zetia.  - Continue  aspirin  81 mg daily. - Continue Crestor  5 mg daily. - Continue Zetia 10 mg daily. - Monitor cholesterol levels and cardiovascular symptoms.              Follow-up: Return in about 1 year (around 10/20/2024).   Signed, Gigi Kyle. Rolm Clos, MD, [facility]  [facility]  [address] [place], [place] [address] [phone]  4:12 PM

## 2023-10-21 ENCOUNTER — Encounter: Payer: Self-pay | Admitting: Cardiovascular Disease

## 2023-10-21 ENCOUNTER — Ambulatory Visit: Payer: Medicare Other | Attending: Cardiovascular Disease | Admitting: Cardiovascular Disease

## 2023-10-21 VITALS — BP 127/78 | HR 88 | Ht 70.0 in | Wt 184.4 lb

## 2023-10-21 DIAGNOSIS — I272 Pulmonary hypertension, unspecified: Secondary | ICD-10-CM | POA: Diagnosis not present

## 2023-10-21 DIAGNOSIS — I2781 Cor pulmonale (chronic): Secondary | ICD-10-CM | POA: Insufficient documentation

## 2023-10-21 DIAGNOSIS — E782 Mixed hyperlipidemia: Secondary | ICD-10-CM | POA: Diagnosis not present

## 2023-10-21 DIAGNOSIS — I2510 Atherosclerotic heart disease of native coronary artery without angina pectoris: Secondary | ICD-10-CM | POA: Diagnosis not present

## 2023-10-21 NOTE — Patient Instructions (Signed)
 Medication Instructions:  Your physician recommends that you continue on your current medications as directed. Please refer to the Current Medication list given to you today.    *If you need a refill on your cardiac medications before your next appointment, please call your pharmacy*   Lab Work: NONE    If you have labs (blood work) drawn today and your tests are completely normal, you will receive your results only by: MyChart Message (if you have MyChart) OR A paper copy in the mail If you have any lab test that is abnormal or we need to change your treatment, we will call you to review the results.   Testing/Procedures: NONE    Follow-Up: At [facility], you and your health needs are our priority.  As part of our continuing mission to provide you with exceptional heart care, we have created designated Provider Care Teams.  These Care Teams include your primary Cardiologist (physician) and Advanced Practice Providers (APPs -  Physician Assistants and Nurse Practitioners) who all work together to provide you with the care you need, when you need it.  We recommend signing up for the patient portal called "MyChart".  Sign up information is provided on this After Visit Summary.  MyChart is used to connect with patients for Virtual Visits (Telemedicine).  Patients are able to view lab/test results, encounter notes, upcoming appointments, etc.  Non-urgent messages can be sent to your provider as well.   To learn more about what you can do with MyChart, go to [email or web address].    Your next appointment:   1 year(s)  The format for your next appointment:   In Person  Provider:   [name], MD    Other Instructions

## 2023-12-05 ENCOUNTER — Other Ambulatory Visit: Payer: Self-pay | Admitting: Cardiovascular Disease

## 2024-01-25 ENCOUNTER — Other Ambulatory Visit: Payer: Self-pay | Admitting: Gastroenterology

## 2024-01-30 DIAGNOSIS — E782 Mixed hyperlipidemia: Secondary | ICD-10-CM | POA: Diagnosis not present

## 2024-01-30 DIAGNOSIS — R7303 Prediabetes: Secondary | ICD-10-CM | POA: Diagnosis not present

## 2024-01-31 LAB — LAB REPORT - SCANNED
A1c: 5.6
Albumin, Urine POC: 33.8
Albumin/Creatinine Ratio, Urine, POC: 16
Creatinine, POC: 205.2 mg/dL
EGFR: 59

## 2024-02-03 DIAGNOSIS — I2510 Atherosclerotic heart disease of native coronary artery without angina pectoris: Secondary | ICD-10-CM | POA: Diagnosis not present

## 2024-02-03 DIAGNOSIS — R809 Proteinuria, unspecified: Secondary | ICD-10-CM | POA: Diagnosis not present

## 2024-02-03 DIAGNOSIS — R7303 Prediabetes: Secondary | ICD-10-CM | POA: Diagnosis not present

## 2024-02-03 DIAGNOSIS — E782 Mixed hyperlipidemia: Secondary | ICD-10-CM | POA: Diagnosis not present

## 2024-02-03 DIAGNOSIS — Z23 Encounter for immunization: Secondary | ICD-10-CM | POA: Diagnosis not present

## 2024-02-03 DIAGNOSIS — N289 Disorder of kidney and ureter, unspecified: Secondary | ICD-10-CM | POA: Diagnosis not present

## 2024-02-03 DIAGNOSIS — D751 Secondary polycythemia: Secondary | ICD-10-CM | POA: Diagnosis not present

## 2024-02-03 DIAGNOSIS — K219 Gastro-esophageal reflux disease without esophagitis: Secondary | ICD-10-CM | POA: Diagnosis not present

## 2024-02-03 DIAGNOSIS — I270 Primary pulmonary hypertension: Secondary | ICD-10-CM | POA: Diagnosis not present

## 2024-02-03 DIAGNOSIS — J9611 Chronic respiratory failure with hypoxia: Secondary | ICD-10-CM | POA: Diagnosis not present

## 2024-02-03 DIAGNOSIS — J449 Chronic obstructive pulmonary disease, unspecified: Secondary | ICD-10-CM | POA: Diagnosis not present

## 2024-02-03 DIAGNOSIS — G4733 Obstructive sleep apnea (adult) (pediatric): Secondary | ICD-10-CM | POA: Diagnosis not present

## 2024-03-13 ENCOUNTER — Other Ambulatory Visit (HOSPITAL_BASED_OUTPATIENT_CLINIC_OR_DEPARTMENT_OTHER): Payer: Self-pay

## 2024-03-19 ENCOUNTER — Ambulatory Visit (HOSPITAL_COMMUNITY)
Admission: RE | Admit: 2024-03-19 | Discharge: 2024-03-19 | Disposition: A | Discharge status: Home / Self Care | Attending: Gastroenterology | Admitting: Gastroenterology

## 2024-03-19 ENCOUNTER — Encounter (HOSPITAL_COMMUNITY): Payer: Self-pay | Admitting: Gastroenterology

## 2024-03-19 ENCOUNTER — Ambulatory Visit (HOSPITAL_COMMUNITY)

## 2024-03-19 ENCOUNTER — Encounter (HOSPITAL_COMMUNITY)
Admission: RE | Disposition: A | Discharge status: Home / Self Care | Payer: Self-pay | Source: Home / Self Care | Attending: Gastroenterology

## 2024-03-19 ENCOUNTER — Other Ambulatory Visit: Payer: Self-pay

## 2024-03-19 DIAGNOSIS — I509 Heart failure, unspecified: Secondary | ICD-10-CM | POA: Insufficient documentation

## 2024-03-19 DIAGNOSIS — I110 Hypertensive heart disease with heart failure: Secondary | ICD-10-CM | POA: Insufficient documentation

## 2024-03-19 DIAGNOSIS — D123 Benign neoplasm of transverse colon: Secondary | ICD-10-CM | POA: Insufficient documentation

## 2024-03-19 DIAGNOSIS — Z87891 Personal history of nicotine dependence: Secondary | ICD-10-CM | POA: Insufficient documentation

## 2024-03-19 DIAGNOSIS — I10 Essential (primary) hypertension: Secondary | ICD-10-CM

## 2024-03-19 DIAGNOSIS — D122 Benign neoplasm of ascending colon: Secondary | ICD-10-CM | POA: Diagnosis not present

## 2024-03-19 DIAGNOSIS — Z1211 Encounter for screening for malignant neoplasm of colon: Secondary | ICD-10-CM

## 2024-03-19 DIAGNOSIS — I2510 Atherosclerotic heart disease of native coronary artery without angina pectoris: Secondary | ICD-10-CM | POA: Diagnosis not present

## 2024-03-19 DIAGNOSIS — K635 Polyp of colon: Secondary | ICD-10-CM | POA: Diagnosis not present

## 2024-03-19 DIAGNOSIS — K644 Residual hemorrhoidal skin tags: Secondary | ICD-10-CM | POA: Insufficient documentation

## 2024-03-19 DIAGNOSIS — J449 Chronic obstructive pulmonary disease, unspecified: Secondary | ICD-10-CM | POA: Diagnosis not present

## 2024-03-19 DIAGNOSIS — Z860100 Personal history of colon polyps, unspecified: Secondary | ICD-10-CM | POA: Diagnosis not present

## 2024-03-19 HISTORY — PX: COLONOSCOPY: SHX5424

## 2024-03-19 SURGERY — COLONOSCOPY
Anesthesia: Monitor Anesthesia Care

## 2024-03-19 MED ORDER — SODIUM CHLORIDE 0.9 % IV SOLN: INTRAVENOUS | Status: DC

## 2024-03-19 MED ORDER — VASOPRESSIN 20 UNIT/ML IV SOLN
INTRAVENOUS | Status: DC | PRN
  Administered 2024-03-19: .5 [IU] via INTRAVENOUS
  Administered 2024-03-19: 1 [IU] via INTRAVENOUS

## 2024-03-19 MED ORDER — PROPOFOL 500 MG/50ML IV EMUL
INTRAVENOUS | Status: DC | PRN
  Administered 2024-03-19 (×2): 100 ug/kg/min via INTRAVENOUS

## 2024-03-19 MED ORDER — LIDOCAINE 2% (20 MG/ML) 5 ML SYRINGE
INTRAMUSCULAR | Status: DC | PRN
  Administered 2024-03-19: 50 mg via INTRAVENOUS

## 2024-03-19 NOTE — Discharge Instructions (Addendum)
 Call if GI question or problem and hold aspirin  for 3 more days and follow-up as needed and call for biopsy report in 1 week if you have not heard from usYOU HAD AN ENDOSCOPIC PROCEDURE TODAY: Refer to the procedure report and other information in the discharge instructions given to you for any specific questions about what was found during the examination. If this information does not answer your questions, please call [facility] office at [phone] to clarify.   YOU SHOULD EXPECT: Some feelings of bloating in the abdomen. Passage of more gas than usual. Walking can help get rid of the air that was put into your GI tract during the procedure and reduce the bloating. If you had a lower endoscopy (such as a colonoscopy or flexible sigmoidoscopy) you may notice spotting of blood in your stool or on the toilet paper. Some abdominal soreness may be present for a day or two, also.  DIET: Your first meal following the procedure should be a light meal and then it is ok to progress to your normal diet. A half-sandwich or bowl of soup is an example of a good first meal. Heavy or fried foods are harder to digest and may make you feel nauseous or bloated. Drink plenty of fluids but you should avoid alcoholic beverages for 24 hours. If you had a esophageal dilation, please see attached instructions for diet.    ACTIVITY: Your care partner should take you home directly after the procedure. You should plan to take it easy, moving slowly for the rest of the day. You can resume normal activity the day after the procedure however YOU SHOULD NOT DRIVE, use power tools, machinery or perform tasks that involve climbing or major physical exertion for 24 hours (because of the sedation medicines used during the test).   SYMPTOMS TO REPORT IMMEDIATELY: A gastroenterologist can be reached at any hour. Please call [phone]  for any of the following symptoms:  Following lower endoscopy (colonoscopy, flexible  sigmoidoscopy) Excessive amounts of blood in the stool  Significant tenderness, worsening of abdominal pains  Swelling of the abdomen that is new, acute  Fever of 100 or higher  Following upper endoscopy (EGD, EUS, ERCP, esophageal dilation) Vomiting of blood or coffee ground material  New, significant abdominal pain  New, significant chest pain or pain under the shoulder blades  Painful or persistently difficult swallowing  New shortness of breath  Black, tarry-looking or red, bloody stools  FOLLOW UP:  If any biopsies were taken you will be contacted by phone or by letter within the next 1-3 weeks. Call [phone]  if you have not heard about the biopsies in 3 weeks.  Please also call with any specific questions about appointments or follow up tests. YOU HAD AN ENDOSCOPIC PROCEDURE TODAY: Refer to the procedure report and other information in the discharge instructions given to you for any specific questions about what was found during the examination. If this information does not answer your questions, please call [facility] office at [phone] to clarify.

## 2024-03-19 NOTE — Anesthesia Postprocedure Evaluation (Signed)
 Anesthesia Post Note  Patient: Warren [name]  Procedure(s) Performed: COLONOSCOPY     Patient location during evaluation: PACU Anesthesia Type: MAC Level of consciousness: awake and alert Pain management: pain level controlled Vital Signs Assessment: post-procedure vital signs reviewed and stable Respiratory status: spontaneous breathing, nonlabored ventilation, respiratory function stable and patient connected to nasal cannula oxygen  Cardiovascular status: blood pressure returned to baseline and stable Postop Assessment: no apparent nausea or vomiting Anesthetic complications: no   No notable events documented.  Last Vitals:  Vitals:   03/19/24 0850 03/19/24 0858  BP: 104/69 103/74  Pulse: 77 79  Resp: 13 20  Temp:    SpO2: 93% 92%    Last Pain:  Vitals:   03/19/24 0858  TempSrc:   PainSc: 0-No pain                 Lynwood MARLA Cornea

## 2024-03-19 NOTE — Anesthesia Preprocedure Evaluation (Signed)
 Anesthesia Evaluation  Patient identified by MRN, date of birth, ID band Patient awake    Reviewed: Allergy & Precautions, NPO status , Patient's Chart, lab work & pertinent test results, reviewed documented beta blocker date and time   History of Anesthesia Complications Negative for: history of anesthetic complications  Airway Mallampati: III  TM Distance: >3 FB     Dental no notable dental hx.    Pulmonary neg shortness of breath, sleep apnea , COPD, former smoker   breath sounds clear to auscultation       Cardiovascular hypertension, pulmonary hypertension+ CAD, +CHF and + DOE   Rhythm:Regular Rate:Normal     Neuro/Psych neg Seizures    GI/Hepatic ,,,(+) neg Cirrhosis        Endo/Other  diabetes, Type 2    Renal/GU Renal disease     Musculoskeletal   Abdominal   Peds  Hematology   Anesthesia Other Findings   Reproductive/Obstetrics                              Anesthesia Physical Anesthesia Plan  ASA: 3  Anesthesia Plan: MAC   Post-op Pain Management:    Induction: Intravenous  PONV Risk Score and Plan: 2 and Ondansetron   Airway Management Planned: Natural Airway and Simple Face Mask  Additional Equipment:   Intra-op Plan:   Post-operative Plan:   Informed Consent: I have reviewed the patients History and Physical, chart, labs and discussed the procedure including the risks, benefits and alternatives for the proposed anesthesia with the patient or authorized representative who has indicated his/her understanding and acceptance.     Dental advisory given  Plan Discussed with: CRNA  Anesthesia Plan Comments:         Anesthesia Quick Evaluation

## 2024-03-19 NOTE — Op Note (Signed)
 [facility] Patient Name: Warren [name] Procedure Date : 03/19/2024 MRN: [id number] Attending MD: Oliva Boots , MD, [id number] Date of Birth: September 04, 1950 CSN: [id number] Age: 73 Admit Type: Outpatient Procedure:                Colonoscopy Indications:              High risk colon cancer surveillance: Personal                            history of colonic polyps, Last colonoscopy 5 years                            ago Providers:                Oliva Boots, MD, Haskel Chris, Technician,                            Heather Ng, RN Referring MD:              Medicines:                Monitored Anesthesia Care Complications:            No immediate complications. Estimated Blood Loss:     Estimated blood loss: none. Procedure:                Pre-Anesthesia Assessment:                           - Prior to the procedure, a History and Physical                            was performed, and patient medications and                            allergies were reviewed. The patient's tolerance of                            previous anesthesia was also reviewed. The risks                            and benefits of the procedure and the sedation                            options and risks were discussed with the patient.                            All questions were answered, and informed consent                            was obtained. Prior Anticoagulants: The patient has                            taken no anticoagulant or antiplatelet agents                            except for aspirin . ASA  Grade Assessment: III - A                            patient with severe systemic disease. After                            reviewing the risks and benefits, the patient was                            deemed in satisfactory condition to undergo the                            procedure.                           After obtaining informed consent, the colonoscope                            was  passed under direct vision. Throughout the                            procedure, the patient's blood pressure, pulse, and                            oxygen  saturations were monitored continuously. The                            CF-HQ190L ([id number]) Olympus colonoscope was                            introduced through the anus and advanced to the the                            cecum, identified by appendiceal orifice and                            ileocecal valve. The colonoscopy was performed                            without difficulty. The patient tolerated the                            procedure well. The quality of the bowel                            preparation was adequate. Scope In: 7:58:55 AM Scope Out: 8:16:34 AM Scope Withdrawal Time: 0 hours 14 minutes 54 seconds  Total Procedure Duration: 0 hours 17 minutes 39 seconds  Findings:      External hemorrhoids were found during retroflexion, during perianal       exam and during digital exam. The hemorrhoids were medium-sized.      Two semi-pedunculated polyps were found in the transverse colon. The       polyps were small in size. These polyps were removed with a cold snare.       Resection and retrieval  were complete.      A diminutive polyp was found in the ascending colon. The polyp was       semi-sessile. Biopsies were taken with a cold forceps for histology.      The exam was otherwise without abnormality on direct and retroflexion       views. Impression:               - External hemorrhoids.                           - Two small polyps in the transverse colon, removed                            with a cold snare. Resected and retrieved.                           - One diminutive polyp in the ascending colon.                            Biopsied.                           - The examination was otherwise normal on direct                            and retroflexion views. Recommendation:           - Patient has a contact  number available for                            emergencies. The signs and symptoms of potential                            delayed complications were discussed with the                            patient. Return to normal activities tomorrow.                            Written discharge instructions were provided to the                            patient.                           - Soft diet today.                           - Continue present medications.                           - Await pathology results.                           - Repeat colonoscopy in 5 years for surveillance  based on pathology results.                           - Return to GI office PRN.                           - Telephone GI clinic for pathology results in 1                            week.                           - Telephone GI clinic if symptomatic PRN.                           - No aspirin , ibuprofen, naproxen, or other                            non-steroidal anti-inflammatory drugs for 3 days                            after polyp removal. Procedure Code(s):        --- Professional ---                           [phone], Colonoscopy, flexible; with removal of                            tumor(s), polyp(s), or other lesion(s) by snare                            technique                           45380, 59, Colonoscopy, flexible; with biopsy,                            single or multiple Diagnosis Code(s):        --- Professional ---                           Z86.010, Personal history of colonic polyps                           D12.3, Benign neoplasm of transverse colon (hepatic                            flexure or splenic flexure)                           D12.2, Benign neoplasm of ascending colon CPT copyright 2022 American Medical Association. All rights reserved. The codes documented in this report are preliminary and upon coder review may  be revised to meet current  compliance requirements. Oliva Boots, MD 03/19/2024 8:28:57 AM This report has been signed electronically. Number of Addenda: 0

## 2024-03-19 NOTE — Progress Notes (Signed)
 Warren [name] 7:32 AM  Subjective: Patient doing well tolerated the prep no new complaints since he was seen recently in the office  Objective: Vital signs stable afebrile no acute distress exam please see preassessment evaluation  Assessment: History of colon polyps due for colonic screening  Plan: Okay to proceed with colonoscopy with anesthesia assistance  [facility]  office [phone] After 5PM or if no answer call [phone]

## 2024-03-19 NOTE — Transfer of Care (Signed)
 Immediate Anesthesia Transfer of Care Note  Patient: Warren [name]  Procedure(s) Performed: COLONOSCOPY  Patient Location: Endoscopy Unit  Anesthesia Type:General  Level of Consciousness: drowsy  Airway & Oxygen  Therapy: Patient Spontanous Breathing and Patient connected to face mask oxygen   Post-op Assessment: Report given to RN and Post -op Vital signs reviewed and stable  Post vital signs: Reviewed and stable  Last Vitals:  Vitals Value Taken Time  BP 115/84 03/19/24 08:30  Temp 36.3 C 03/19/24 08:26  Pulse 72 03/19/24 08:38  Resp 10 03/19/24 08:38  SpO2 92 % 03/19/24 08:38  Vitals shown include unfiled device data.  Last Pain:  Vitals:   03/19/24 0826  TempSrc: Temporal  PainSc: 0-No pain         Complications: No notable events documented.

## 2024-03-20 ENCOUNTER — Ambulatory Visit (INDEPENDENT_AMBULATORY_CARE_PROVIDER_SITE_OTHER): Admitting: Otolaryngology

## 2024-03-20 ENCOUNTER — Encounter (INDEPENDENT_AMBULATORY_CARE_PROVIDER_SITE_OTHER): Payer: Self-pay | Admitting: Otolaryngology

## 2024-03-20 VITALS — BP 130/90 | HR 92 | Temp 98.2°F | Ht 69.0 in | Wt 177.0 lb

## 2024-03-20 DIAGNOSIS — H60332 Swimmer's ear, left ear: Secondary | ICD-10-CM

## 2024-03-20 DIAGNOSIS — H60501 Unspecified acute noninfective otitis externa, right ear: Secondary | ICD-10-CM

## 2024-03-20 LAB — SURGICAL PATHOLOGY

## 2024-03-20 MED ORDER — CIPROFLOXACIN-DEXAMETHASONE 0.3-0.1 % OT SUSP: 4.0000 [drp] | Freq: Two times a day (BID) | OTIC | 8 refills | Status: AC

## 2024-03-21 ENCOUNTER — Encounter (HOSPITAL_COMMUNITY): Payer: Self-pay | Admitting: Gastroenterology

## 2024-03-21 DIAGNOSIS — H60332 Swimmer's ear, left ear: Secondary | ICD-10-CM | POA: Insufficient documentation

## 2024-03-21 DIAGNOSIS — H60331 Swimmer's ear, right ear: Secondary | ICD-10-CM | POA: Insufficient documentation

## 2024-03-21 NOTE — Progress Notes (Signed)
 CC: Right ear discomfort  Discussed the use of AI scribe software for clinical note transcription with the patient, who gave verbal consent to proceed.  History of Present Illness Warren [name] is a 73 year old male who presents with right ear discomfort and a sensation of fullness.  He has experienced a sensation of fullness in his right ear for approximately one week, describing it as feeling like 'water in it.' The ear feels clogged, and there is associated pain when he inserts his hearing aid. No recent swimming or water exposure that could have contributed to this sensation.  He attempted to alleviate the symptoms using Debrox, an over-the-counter earwax removal solution, but it did not provide any relief. The left ear is asymptomatic, and he continues to use his hearing aid in that ear without issue.  Exam: General: Communicates without difficulty, well nourished, no acute distress. Head: Normocephalic, no evidence injury, no tenderness, facial buttresses intact without stepoff. Face/sinus: No tenderness to palpation and percussion. Facial movement is normal and symmetric. Eyes: PERRL, EOMI. No scleral icterus, conjunctivae clear. Neuro: CN II exam reveals vision grossly intact.  No nystagmus at any point of gaze. Ears: Auricles well formed without lesions.  The left ear canal and tympanic membrane are normal.  The right ear canal and tympanic membrane are erythematous and edematous, consistent with an acute otitis externa.  Nose: External evaluation reveals normal support and skin without lesions.  Dorsum is intact.  Anterior rhinoscopy reveals congested mucosa over anterior aspect of inferior turbinates and intact septum.  No purulence noted. Oral:  Oral cavity and oropharynx are intact, symmetric, without erythema or edema.  Mucosa is moist without lesions. Neck: Full range of motion without pain.  There is no significant lymphadenopathy.  No masses palpable.  Thyroid bed within normal  limits to palpation.  Parotid glands and submandibular glands equal bilaterally without mass.  Trachea is midline. Neuro:  CN 2-12 grossly intact.    Assessment and Plan Assessment & Plan Acute right otitis externa Redness and swelling, causing pain with hearing aid use. Symptoms present for about a week. No recent swimming or water exposure. Examination reveals inflammation and infection in the right ear. - Prescribed Ciprodex ear drops, 4 drops in the right ear twice daily for one week. - Scheduled follow-up appointment in three weeks.

## 2024-04-01 NOTE — Progress Notes (Signed)
 [facility] Note   Date:  04/01/2024   ID:  Warren [name], DOB Dec 01, 1950, MRN [id number]  Location: Home  Provider location:  [facility] Type of Visit: Established patient  PCP:  Shona Norleen PEDLAR, MD  Cardiologist:  Darryle ONEIDA Decent, MD Primary HF: Ramon Zanders  Chief Complaint: Heart Failure follow-up   History of Present Illness:   Warren [name] is a 73 y.o. male with a hx of HTN, HLD, pulmonary hypertension referred by Dr. Decent for further management of PAH.    He has had a very complete w/u by Dr. Decent. He has had rather rapid onset of SOB associated with severe diffusion defect on PFTs with normal spirometry. CT negative for ILD. Echo showed normal EF with moderate to severe pHTN. VQ scan was negative.  His basic rheumatologic work-up was negative.    PFTs 4/21 FEV1 2.8 (87%) FVC 4.1 (94%) DLCO 25%   Underwent R/L cath 12/14/19. Minimal non-obstructive CAD ( pRCA 20 mLAD 40) PA 68/28 (44) PCW 6 Fick 4.9/2.4 PVR 7.8  RHC 9/22  RA = 2 RV = 49/2 PA = 45/16 (28) PCW = 3 Fick cardiac output/index = 5.7/2.8 PVR = 4.4 WU Ao sat = 97% PA sat = 75%, 75% -> selex added  RHC 3/24 RA = 4 RV = 44/3  PA = 44/18 (30) PCW = 6 Fick cardiac output/index = 5.1/2.6 PVR = 4.7 Ao sat = 94% PA sat = 68%, 69%   Now on Macitentan  10 daily, sildenafil  60 tid, selexipag  800 bid, sotatercept 1.2 (started 9/24)  Here today for f/u with his wife. Feels good. Recently had nasal cauterization for epistaxis. Says he feels better with Sotatercept. Wears 3L O2. Remains very active around the house and in the yard. Says he has some gingival bleeding in the first week after taking the drug. No edema, syncope or presyncope. Struggling with   Hgb 15.0 -> 15.8 -> 16.0 PLTs 203 -> 248 -> 213  Echo 10/18/2023 EF 60% RV normal. TR inadequate to assess RVSP .Personally reviewed    Studies  Echo 07/29/22 EF 60-65% RV normal. TR inadequate to assess  RVSP  Echo 3/23 EF 60-65% RV normal TR inadequate to assess RVSP Echo 5/22 EF 50-55% RV normal Echo 05/01/20 EF 50-55% RV mildly dilated mild HK RVSP ~44mmHG  PAH therapy 1. Macitentan  10mg   Started 8/21 2. Sildenafil  60 tid Started 10/21 3. Selexipag  800 bid 4. Sotatercept 1.2 (started 9/24)  - 01/02/20 255m with sats down to 79% - 03/14/20 237m (stopped for 1 min due to drop in O2) - 09/01/20 429m O2 Sat ranged 80%-93%  on 3-8L oxygen  - 07/09/21 354m O2 Sat ranged 87%-97% on 6-8L oxygen  - 11/06/21 518 m O2 sat ranged 80-88% on 6L oxygen  - 3/24 457.2 meters O2 Sat ranged 90%-85 % on 8L oxygen  - 04/19/23 457.15m O2 Sat ranged 84-95 on 2 L oxygen   -  10/18/23 394.32m O2 Sat ranged 82-94 on 6L oxygen     Past Medical History:  Diagnosis Date   CHF (congestive heart failure) (HCC)    Coronary artery disease    Hyperlipidemia    Hypertension    OSA treated with BiPAP    mild OSA with an AHI of 5/hr but increased to 11.9/hr during REM sleep. He had nocturnal hypoxemia with O2 sats < 88% for 32 min.  On BiPAP at 9/5cm H2O  Past Surgical History:  Procedure Laterality Date   CARDIAC CATHETERIZATION     COLONOSCOPY N/A 03/19/2024   Procedure: COLONOSCOPY;  Surgeon: Rosalie Kitchens, MD;  Location: [facility] ENDOSCOPY;  Service: Gastroenterology;  Laterality: N/A;   RIGHT HEART CATH N/A 01/22/2021   Procedure: RIGHT HEART CATH;  Surgeon: Cherrie Toribio SAUNDERS, MD;  Location: [facility] INVASIVE CV LAB;  Service: Cardiovascular;  Laterality: N/A;   RIGHT HEART CATH N/A 08/10/2022   Procedure: RIGHT HEART CATH;  Surgeon: Cherrie Toribio SAUNDERS, MD;  Location: [facility] INVASIVE CV LAB;  Service: Cardiovascular;  Laterality: N/A;   RIGHT/LEFT HEART CATH AND CORONARY ANGIOGRAPHY N/A 12/14/2019   Procedure: RIGHT/LEFT HEART CATH AND CORONARY ANGIOGRAPHY;  Surgeon: Cherrie Toribio SAUNDERS, MD;  Location: [facility] INVASIVE CV LAB;  Service: Cardiovascular;  Laterality: N/A;     Current Outpatient Medications   Medication Sig Dispense Refill   acetaminophen  (TYLENOL ) 500 MG tablet Take 1,000 mg by mouth every 6 (six) hours as needed for moderate pain or headache.     aspirin  EC 81 MG tablet Take 1 tablet (81 mg total) by mouth daily. 90 tablet 3   chlorhexidine (PERIDEX) 0.12 % solution as needed (gum bleeding).     ezetimibe (ZETIA) 10 MG tablet Take 10 mg by mouth daily.     OPSUMIT  10 MG tablet TAKE 1 TABLET DAILY. 30 tablet 11   OXYGEN  Inhale 2-3 L into the lungs continuous. 2 L at rest and 3 L with activity     pantoprazole (PROTONIX) 40 MG tablet Take 40 mg by mouth every other day.     rosuvastatin  (CRESTOR ) 5 MG tablet Take 1 tablet (5 mg total) by mouth daily. 90 tablet 1   sildenafil  (REVATIO ) 20 MG tablet TAKE THREE TABLETS BY MOUTH THREE TIMES DAILY 270 tablet 10   spironolactone  (ALDACTONE ) 25 MG tablet Take 0.5 tablets (12.5 mg total) by mouth daily. 45 tablet 3   UPTRAVI  800 MCG TABS TAKE 1 TABLET TWO TIMES DAILY. 60 tablet 11   WINREVAIR KIT subcutaneous injection Target Dose Inject 1.2 ml subcutaneously every 3 weeks. Dosing Interval is every 3 weeks. Winrevair Patient Weight: 84 kg 1 kit 11   No current facility-administered medications for this encounter.   Facility-Administered Medications Ordered in Other Encounters  Medication Dose Route Frequency Provider Last Rate Last Admin   sodium chloride  flush (NS) 0.9 % injection 10 mL  10 mL Intravenous PRN [name], Darryle Ned, MD   20 mL at 12/05/19 1125    Allergies:   Amlodipine besylate, Cefdinir, Penicillins, and Valsartan   Social History:  The patient  reports that he quit smoking about 21 years ago. His smoking use included cigarettes. He has never used smokeless tobacco. He reports that he does not currently use alcohol. He reports that he does not use drugs.   Family History:  The patient's family history includes Heart disease in his mother.   ROS:  Please see the history of present illness.   All other systems are  personally reviewed and negative.   There were no vitals filed for this visit.    Exam:  General:  Well appearing. No resp difficulty HEENT: normal Flushed. Wearing O2. + telangiectacias Neck: supple. no JVD. Carotids 2+ bilat; no bruits. No lymphadenopathy or thryomegaly appreciated. Cor: PMI nondisplaced. Regular rate & rhythm. No rubs, gallops or murmurs. Lungs: clear Abdomen: soft, nontender, nondistended. No hepatosplenomegaly. No bruits or masses. Good bowel sounds. Extremities: no cyanosis, clubbing, rash, edema Neuro: alert & orientedx3,  cranial nerves grossly intact. moves all 4 extremities w/o difficulty. Affect pleasant  Recent Labs: 10/18/2023: ALT 19; B Natriuretic Peptide 8.8; BUN 14; Creatinine, Ser 1.16; Hemoglobin 16.2; Platelets 259; Potassium 4.5; Sodium 139  Personally reviewed   Wt Readings from Last 3 Encounters:  03/20/24 80.3 kg (177 lb)  03/19/24 80.3 kg (177 lb)  10/21/23 83.6 kg (184 lb 6.4 oz)    ASSESSMENT AND PLAN:  1. Moderate to severe pulmonary HTN - PFT 08/31/2019: no obstruction/restriction, severe diffusion defect (DCLO 25%) - CT negative for ILD. ANA negative, anti-CCP negative, anti-RA negative - VQ scan negative CTEPH - Sleep study with mild PAH. Follows with Dr. 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 RV mild to moderate HK  - Echo 05/01/20 EF 50-55% RV mildly to moderately HK  - Echo 5/22 EF 50-55% RV normal - Echo 03/23 EF 60-65%, RV normal - Echo  07/29/22 EF 60-65% RV normal. TR inadequate to assess RVSP Personally reviewed - RHC 3/24 PA 44/18 (30) PCW 6 Fick 5.1/2.6 PVR 4.7 on triple therapy - macitentan  and sildenafil  60 tid and selexipag  800 bid - Sotatercept added in 9/24 - Echo today 10/18/23: EF 60% RV normal. TR insufficient to assess RVSP Personally reviewed -Doing well   - Volume ok  - 01/02/20 251m with sats down to 79% - 04/19/23 457.6m O2 Sat ranged 84-95 on 2 L oxygen   -  10/18/23 394.26m O2  Sat ranged 82-94 on 6L oxygen   - Reveal Lite risk score 2   Addendum: Pt ambulated 1332ft (394.12m) O2 Sat ranged 94-82 on 6L oxygen  HR ranged 106-84  2. Chronic respiratory failure due to [facility] - continue to wear O2. Follow sats with pulse ox and titrate to keep sats >= 90%  - Follows with Dr. Donald - Continue home O2   3.  CAD -Three-vessel calcification seen on recent lung CT - Minimal CAD by cath  - No s/s angina - Followed by Dr. Barbaraann. Has been intolerant of statins.   4. OSA - Mild OSA 1/22: AHI 5.1/hr. Sats down to 77% - Followed by Dr. Shlomo  5. Proteinuria - Albumin /creatinine ratio 650 on labs at PCP's office in February now down to 29 - Continue spironolactone  12.5 mg daily - Resolved with spiro    Signed, Toribio Fuel, MD  04/01/2024 9:13 PM  [facility] [address] [facility] [place] [address] [phone] (office) [phone] (fax)

## 2024-04-02 ENCOUNTER — Ambulatory Visit (HOSPITAL_COMMUNITY)
Admission: RE | Admit: 2024-04-02 | Discharge: 2024-04-02 | Disposition: A | Discharge status: Home / Self Care | Source: Ambulatory Visit | Attending: Internal Medicine | Admitting: Internal Medicine

## 2024-04-02 ENCOUNTER — Encounter (HOSPITAL_COMMUNITY): Payer: Self-pay | Admitting: Internal Medicine

## 2024-04-02 ENCOUNTER — Other Ambulatory Visit (HOSPITAL_COMMUNITY): Payer: Self-pay

## 2024-04-02 VITALS — BP 138/88 | HR 87 | Ht 69.0 in | Wt 185.8 lb

## 2024-04-02 DIAGNOSIS — I272 Pulmonary hypertension, unspecified: Secondary | ICD-10-CM | POA: Insufficient documentation

## 2024-04-02 DIAGNOSIS — Z9981 Dependence on supplemental oxygen: Secondary | ICD-10-CM | POA: Diagnosis not present

## 2024-04-02 DIAGNOSIS — J9610 Chronic respiratory failure, unspecified whether with hypoxia or hypercapnia: Secondary | ICD-10-CM | POA: Diagnosis not present

## 2024-04-02 DIAGNOSIS — Z79899 Other long term (current) drug therapy: Secondary | ICD-10-CM | POA: Insufficient documentation

## 2024-04-02 DIAGNOSIS — J9611 Chronic respiratory failure with hypoxia: Secondary | ICD-10-CM | POA: Diagnosis not present

## 2024-04-02 DIAGNOSIS — G4733 Obstructive sleep apnea (adult) (pediatric): Secondary | ICD-10-CM | POA: Diagnosis not present

## 2024-04-02 DIAGNOSIS — I110 Hypertensive heart disease with heart failure: Secondary | ICD-10-CM | POA: Diagnosis not present

## 2024-04-02 DIAGNOSIS — M25551 Pain in right hip: Secondary | ICD-10-CM | POA: Insufficient documentation

## 2024-04-02 DIAGNOSIS — I2510 Atherosclerotic heart disease of native coronary artery without angina pectoris: Secondary | ICD-10-CM | POA: Insufficient documentation

## 2024-04-02 DIAGNOSIS — E785 Hyperlipidemia, unspecified: Secondary | ICD-10-CM | POA: Diagnosis not present

## 2024-04-02 DIAGNOSIS — R809 Proteinuria, unspecified: Secondary | ICD-10-CM | POA: Insufficient documentation

## 2024-04-02 DIAGNOSIS — I509 Heart failure, unspecified: Secondary | ICD-10-CM | POA: Insufficient documentation

## 2024-04-02 DIAGNOSIS — Z87891 Personal history of nicotine dependence: Secondary | ICD-10-CM | POA: Insufficient documentation

## 2024-04-02 NOTE — Progress Notes (Signed)
 6 Min Walk Test Completed  Pt ambulated 1234ft (365.64m) O2 Sat ranged 95-85 on 8L oxygen  HR ranged 106-92

## 2024-04-02 NOTE — Addendum Note (Signed)
 Encounter addended by: Krystan Northrop M, RN on: 04/02/2024 11:11 AM  Actions taken: Order list changed, Diagnosis association updated

## 2024-04-02 NOTE — Addendum Note (Signed)
 Encounter addended by: Buell Powell HERO, RN on: 04/02/2024 11:25 AM  Actions taken: Pend clinical note, Clinical Note Signed

## 2024-04-02 NOTE — Patient Instructions (Signed)
 Medication Changes:  None, continue current medications   Testing/Procedures:  Your provider has recommended you have a High Resolution CT Scan. Once approved by insurance you will be called to schedule this  Referrals:  You have been referred to Dr Petra at [facility], they will call you to schedule  Special Instructions // Education:  Do the following things EVERYDAY: Weigh yourself in the morning before breakfast. Write it down and keep it in a log. Take your medicines as prescribed Eat low salt foods--Limit salt (sodium) to 2000 mg per day.  Stay as active as you can everyday Limit all fluids for the day to less than 2 liters   Follow-Up in: 6 months (May 2026), **PLEASE CALL OUR OFFICE IN MARCH TO SCHEDULE THIS APPOINTMENT   At the [facility], you and your health needs are our priority. We have a designated team specialized in the treatment of Heart Failure. This Care Team includes your primary Heart Failure Specialized Cardiologist (physician), Advanced Practice Providers (APPs- Physician Assistants and Nurse Practitioners), and Pharmacist who all work together to provide you with the care you need, when you need it.   You may see any of the following providers on your designated Care Team at your next follow up:  Dr. Toribio Fuel Dr. Ezra Shuck Dr. Odis Brownie Greig Mosses, NP Caffie Shed, [place] [facility] [place], [place] Beckey Coe, NP Jordan Lee, NP Tinnie Redman, PharmD   Please be sure to bring in all your medications bottles to every appointment.   Need to Contact Us :  If you have any questions or concerns before your next appointment please send us  a message through [place] or call our office at [phone].    TO LEAVE A MESSAGE FOR THE NURSE SELECT OPTION 2, PLEASE LEAVE A MESSAGE INCLUDING: YOUR NAME DATE OF BIRTH CALL BACK NUMBER REASON FOR CALL**this is important as we prioritize the call backs  YOU WILL  RECEIVE A CALL BACK THE SAME DAY AS LONG AS YOU CALL BEFORE 4:00 PM

## 2024-04-04 ENCOUNTER — Telehealth (HOSPITAL_COMMUNITY): Payer: Self-pay

## 2024-04-04 NOTE — Telephone Encounter (Signed)
 Advanced [facility] Failure Patient Advocate Encounter  Patient expressed concern with pricing for PAH medications (Opsumit , Sildenafil , Uptravi , Winrevair) with new insurance plan to start in 2026. I will check to see if medications require prior authorizations or other assistance after the start of the new year. Patient voicemail is not set up, unable to leave a message at this time.  Rachel DEL, CPhT Rx Patient Advocate Phone: [phone]

## 2024-04-10 ENCOUNTER — Encounter (INDEPENDENT_AMBULATORY_CARE_PROVIDER_SITE_OTHER): Payer: Self-pay | Admitting: Otolaryngology

## 2024-04-10 ENCOUNTER — Ambulatory Visit (INDEPENDENT_AMBULATORY_CARE_PROVIDER_SITE_OTHER): Admitting: Otolaryngology

## 2024-04-10 VITALS — HR 70 | Ht 69.0 in | Wt 183.0 lb

## 2024-04-10 DIAGNOSIS — H6091 Unspecified otitis externa, right ear: Secondary | ICD-10-CM | POA: Diagnosis not present

## 2024-04-10 DIAGNOSIS — R040 Epistaxis: Secondary | ICD-10-CM

## 2024-04-10 DIAGNOSIS — H60331 Swimmer's ear, right ear: Secondary | ICD-10-CM

## 2024-04-10 NOTE — Progress Notes (Signed)
 Patient ID: Warren [name], male   DOB: [birth year], 73 y.o.   MRN: [id number]  Follow up: Right otitis externa, recurrent epistaxis  Discussed the use of AI scribe software for clinical note transcription with the patient, who gave verbal consent to proceed.  History of Present Illness Warren [name] is a 73 year old male who presents with follow-up of his right swimmer's ear.  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 This was his first experience with swimmer's ear. No current ear pain or drainage.  He has a history of pulmonary arterial hypertension and is on multiple vasodilators, including Winrevir. This medication has caused side effects such as epistaxis and gum bleeding. He has had nosebleeds, which were treated with cauterization, and he now experiences only minor spotting rather than significant bleeding.  He uses a BiPAP machine with moisture at night to alleviate dryness caused by oxygen  use, which he believes contributes to epistaxis. He is on a medication regimen that includes blood thinners, increasing his risk of bleeding.  Exam: General: Communicates without difficulty, well nourished, no acute distress. Head: Normocephalic, no evidence injury, no tenderness, facial buttresses intact without stepoff. Face/sinus: No tenderness to palpation and percussion. Facial movement is normal and symmetric. Eyes: PERRL, EOMI. No scleral icterus, conjunctivae clear. Neuro: CN II exam reveals vision grossly intact.  No nystagmus at any point of gaze. Ears: Auricles well formed without lesions.  Ear canals are intact without mass or lesion.  No erythema or edema is appreciated.  The TMs are intact without fluid. Nose: External evaluation reveals normal support and skin without lesions.  Dorsum is intact.  Anterior rhinoscopy reveals congested mucosa over anterior aspect of inferior turbinates and intact septum.  No  bleeding noted. Oral:  Oral cavity and oropharynx are intact, symmetric, without erythema or edema.  Mucosa is moist without lesions. Neck: Full range of motion without pain.  There is no significant lymphadenopathy.  No masses palpable.  Thyroid bed within normal limits to palpation.  Parotid glands and submandibular glands equal bilaterally without mass.  Trachea is midline. Neuro:  CN 2-12 grossly intact.    Assessment and Plan Assessment & Plan Epistaxis secondary to medication and oxygen  use Epistaxis is likely due to anticoagulant medication and oxygen  use, which dry out nasal mucosa. Episodes have decreased in severity, now presenting as minor spotting rather than significant bleeding. - Continue using BiPAP with moisture at night to reduce nasal dryness. - Monitor for significant bleeding and contact provider if epistaxis worsens.  Right acute otitis externa has significantly improved.  No significant erythema or edema is noted. - Ciprodex eardrops as needed.

## 2024-04-27 ENCOUNTER — Other Ambulatory Visit (HOSPITAL_COMMUNITY): Payer: Self-pay | Admitting: Internal Medicine

## 2024-04-30 ENCOUNTER — Ambulatory Visit (HOSPITAL_COMMUNITY)
Admission: RE | Admit: 2024-04-30 | Discharge: 2024-04-30 | Disposition: A | Source: Ambulatory Visit | Attending: Internal Medicine | Admitting: Internal Medicine

## 2024-04-30 DIAGNOSIS — I272 Pulmonary hypertension, unspecified: Secondary | ICD-10-CM

## 2024-04-30 DIAGNOSIS — R918 Other nonspecific abnormal finding of lung field: Secondary | ICD-10-CM | POA: Diagnosis not present

## 2024-04-30 DIAGNOSIS — J432 Centrilobular emphysema: Secondary | ICD-10-CM | POA: Diagnosis not present

## 2024-05-01 NOTE — Telephone Encounter (Signed)
 Will need repeat CBC soon

## 2024-05-07 ENCOUNTER — Telehealth (HOSPITAL_COMMUNITY): Payer: Self-pay | Admitting: Pharmacist

## 2024-05-07 ENCOUNTER — Other Ambulatory Visit (HOSPITAL_COMMUNITY): Payer: Self-pay

## 2024-05-07 ENCOUNTER — Ambulatory Visit (HOSPITAL_COMMUNITY): Payer: Self-pay | Admitting: Internal Medicine

## 2024-05-07 ENCOUNTER — Telehealth (HOSPITAL_COMMUNITY): Payer: Self-pay | Admitting: *Deleted

## 2024-05-07 NOTE — Telephone Encounter (Signed)
 Responded to patient call. He wanted to inform the clinic his insurance is changing next year.   ID [id number] RXBIN [id number] RXPCN [id number] RXGROUP [id number]

## 2024-05-07 NOTE — Telephone Encounter (Signed)
 Called patient per Dr. Cherrie with following CT results:  No change.  Pt verbalized understanding of same and reports he had seen results in MyChart.  Pt did ask about which pharmacist to contact for change in his insurance and needing prior auth on multiple medications for PAH. Informed him covering PharmD is Jaun Bash, PharmD. I messaged Nason and ask him to call patient.

## 2024-05-15 ENCOUNTER — Other Ambulatory Visit (HOSPITAL_COMMUNITY): Payer: Self-pay

## 2024-05-21 ENCOUNTER — Other Ambulatory Visit (HOSPITAL_COMMUNITY): Payer: Self-pay

## 2024-05-25 ENCOUNTER — Other Ambulatory Visit (HOSPITAL_COMMUNITY): Payer: Self-pay

## 2024-05-25 ENCOUNTER — Telehealth (HOSPITAL_COMMUNITY): Payer: Self-pay

## 2024-05-25 NOTE — Telephone Encounter (Signed)
 Advanced Heart Failure Patient Advocate Encounter  The patient was approved for a [employer] grant that will help cover the cost of Opsumit , Sildenafil , Uptravi , Winrevair.  Total amount awarded, $6,500.  Effective: 05/27/2023 - 04/24/2025.  BIN [id number] PCN [id number] Group [id number] ID [id number]  Pharmacy provided with approval and processing information. [facility] already has an active [employer] on file. Patient informed via phone.  Rachel DEL, CPhT Rx Patient Advocate Phone: [phone]

## 2024-05-25 NOTE — Telephone Encounter (Signed)
 Advanced Heart Failure Patient Advocate Encounter  Prior authorization for Sildenafil  has been submitted and approved. Test billing returns $237.35 for 30 day supply. Of note, this patient has a grant that will cover the cost of the medication to $0  Key: [id number] Effective: 05/25/2024 to 05/25/2025  Rachel DEL, CPhT Rx Patient Advocate Phone: [phone]

## 2024-05-25 NOTE — Telephone Encounter (Signed)
 Prior authorization approved and grant obtained in separate encounters.

## 2024-05-29 ENCOUNTER — Telehealth (HOSPITAL_COMMUNITY): Payer: Self-pay

## 2024-05-29 ENCOUNTER — Other Ambulatory Visit (HOSPITAL_COMMUNITY): Payer: Self-pay

## 2024-05-29 NOTE — Telephone Encounter (Signed)
 Advanced Heart Failure Patient Advocate Encounter  Prior authorization for Uptravi  has been submitted and approved. Test billing returns $2100 for 30 day supply. This patient has a grant that will cover the cost of the medication to $0.  Key: [id number] Effective: 05/29/2024 to 05/29/2025  Rachel DEL, CPhT Rx Patient Advocate Phone: [phone]

## 2024-05-29 NOTE — Telephone Encounter (Signed)
 Advanced Heart Failure Patient Advocate Encounter  Received notification that prior authorization is needed for Opsumit  (Key [id number]). Test billing returns a copay of $2100 with no prior authorization needed.  This patient has a grant that will cover the cost of the medication to $0. No prior authorization submitted at this time.  Rachel DEL, CPhT Rx Patient Advocate Phone: [phone]

## 2024-05-29 NOTE — Telephone Encounter (Signed)
 Advanced Heart Failure Patient Advocate Encounter  Received notification that prior authorization is needed for Uptravi  [facility]). Test billing returns a copay of $2100 with no prior authorization needed.   This patient has a grant that will cover the cost of the medication to $0. No prior authorization submitted at this time.  Rachel DEL, CPhT Rx Patient Advocate Phone: [phone]

## 2024-05-30 ENCOUNTER — Other Ambulatory Visit (HOSPITAL_COMMUNITY): Payer: Self-pay | Admitting: Cardiology

## 2024-05-31 ENCOUNTER — Telehealth (HOSPITAL_COMMUNITY): Payer: Self-pay | Admitting: Pharmacist

## 2024-05-31 ENCOUNTER — Other Ambulatory Visit (HOSPITAL_COMMUNITY): Payer: Self-pay

## 2024-05-31 NOTE — Telephone Encounter (Signed)
 Patient Advocate Encounter   Received notification from [employer] that prior authorization for Winrevair is required.   PA submitted on CoverMyMeds Key [id number] Status is pending   Will continue to follow.  Tinnie Redman, PharmD, BCPS, BCCP, CPP Heart Failure Clinic Pharmacist [phone]

## 2024-06-01 NOTE — Telephone Encounter (Signed)
 Advanced Heart Failure Patient Advocate Encounter  Prior Authorization for Imelda has been approved.     Effective dates: 05/24/24 through 05/31/25  Tinnie Redman, PharmD, BCPS, BCCP, CPP Heart Failure Clinic Pharmacist [phone]

## 2024-06-05 ENCOUNTER — Other Ambulatory Visit (HOSPITAL_COMMUNITY): Payer: Self-pay | Admitting: Internal Medicine

## 2024-06-20 ENCOUNTER — Other Ambulatory Visit: Payer: Self-pay | Admitting: Cardiovascular Disease

## 2024-06-28 ENCOUNTER — Telehealth (HOSPITAL_COMMUNITY): Payer: Self-pay

## 2024-06-28 ENCOUNTER — Other Ambulatory Visit (HOSPITAL_COMMUNITY): Payer: Self-pay

## 2024-06-28 NOTE — Telephone Encounter (Signed)
 Advanced Heart Failure Patient Advocate Encounter  Prior authorization for Opsumit  has been submitted and approved. Test billing returns refill too soon rejection; unable to confirm copay at this time.  Key: [id number] Effective: 06/28/2024 to 06/28/2025  Rachel DEL, CPhT Rx Patient Advocate Phone: [phone]
